# Patient Record
Sex: Male | Born: 1970 | Race: White | Hispanic: No | Marital: Married | Smoking: Never smoker
Health system: Southern US, Community
[De-identification: ages and names within clinical notes are randomized; demographics above are authoritative.]

## PROBLEM LIST (undated history)

## (undated) DIAGNOSIS — R112 Nausea with vomiting, unspecified: Secondary | ICD-10-CM

## (undated) DIAGNOSIS — Z9889 Other specified postprocedural states: Secondary | ICD-10-CM

## (undated) DIAGNOSIS — E119 Type 2 diabetes mellitus without complications: Secondary | ICD-10-CM

## (undated) DIAGNOSIS — F419 Anxiety disorder, unspecified: Secondary | ICD-10-CM

## (undated) DIAGNOSIS — Z87442 Personal history of urinary calculi: Secondary | ICD-10-CM

## (undated) DIAGNOSIS — M1990 Unspecified osteoarthritis, unspecified site: Secondary | ICD-10-CM

## (undated) DIAGNOSIS — I2510 Atherosclerotic heart disease of native coronary artery without angina pectoris: Secondary | ICD-10-CM

## (undated) HISTORY — PX: VASECTOMY: SHX75

## (undated) HISTORY — PX: CORONARY ARTERY BYPASS GRAFT: SHX141

---

## 2009-04-10 ENCOUNTER — Ambulatory Visit: Payer: Self-pay | Admitting: Cardiology

## 2018-04-20 ENCOUNTER — Other Ambulatory Visit (HOSPITAL_COMMUNITY): Payer: Self-pay | Admitting: Podiatry

## 2018-04-20 ENCOUNTER — Other Ambulatory Visit: Payer: Self-pay | Admitting: Podiatry

## 2018-04-20 DIAGNOSIS — M86471 Chronic osteomyelitis with draining sinus, right ankle and foot: Secondary | ICD-10-CM

## 2018-04-25 ENCOUNTER — Ambulatory Visit (HOSPITAL_COMMUNITY)
Admission: RE | Admit: 2018-04-25 | Discharge: 2018-04-25 | Disposition: A | Discharge status: Home / Self Care | Payer: BC Managed Care – PPO | Source: Ambulatory Visit | Attending: Podiatry | Admitting: Podiatry

## 2018-04-25 ENCOUNTER — Other Ambulatory Visit: Payer: Self-pay

## 2018-04-25 DIAGNOSIS — M86471 Chronic osteomyelitis with draining sinus, right ankle and foot: Secondary | ICD-10-CM | POA: Diagnosis not present

## 2018-04-25 MED ORDER — GADOBUTROL 1 MMOL/ML IV SOLN
10.0000 mL | Freq: Once | INTRAVENOUS | Status: AC | PRN
  Administered 2018-04-25: 12:00:00 10 mL via INTRAVENOUS

## 2018-05-04 ENCOUNTER — Other Ambulatory Visit: Payer: Self-pay | Admitting: Podiatry

## 2018-05-05 ENCOUNTER — Ambulatory Visit (HOSPITAL_COMMUNITY)
Admission: RE | Admit: 2018-05-05 | Discharge: 2018-05-05 | Disposition: A | Discharge status: Home / Self Care | Payer: BC Managed Care – PPO | Source: Ambulatory Visit | Attending: Podiatry | Admitting: Podiatry

## 2018-05-05 ENCOUNTER — Other Ambulatory Visit (HOSPITAL_COMMUNITY): Payer: Self-pay | Admitting: Podiatry

## 2018-05-05 ENCOUNTER — Encounter (HOSPITAL_COMMUNITY): Payer: Self-pay

## 2018-05-05 ENCOUNTER — Other Ambulatory Visit: Payer: Self-pay

## 2018-05-05 ENCOUNTER — Encounter (HOSPITAL_COMMUNITY)
Admission: RE | Admit: 2018-05-05 | Discharge: 2018-05-05 | Disposition: A | Discharge status: Home / Self Care | Payer: BC Managed Care – PPO | Source: Ambulatory Visit | Attending: Podiatry | Admitting: Podiatry

## 2018-05-05 DIAGNOSIS — M86471 Chronic osteomyelitis with draining sinus, right ankle and foot: Secondary | ICD-10-CM

## 2018-05-05 HISTORY — DX: Anxiety disorder, unspecified: F41.9

## 2018-05-05 HISTORY — DX: Personal history of urinary calculi: Z87.442

## 2018-05-05 HISTORY — DX: Other specified postprocedural states: Z98.890

## 2018-05-05 HISTORY — DX: Other specified postprocedural states: R11.2

## 2018-05-05 HISTORY — DX: Type 2 diabetes mellitus without complications: E11.9

## 2018-05-05 LAB — CBC WITH DIFFERENTIAL/PLATELET
Abs Immature Granulocytes: 0.03 10*3/uL (ref 0.00–0.07)
Basophils Absolute: 0.1 10*3/uL (ref 0.0–0.1)
Basophils Relative: 1 %
Eosinophils Absolute: 0.2 10*3/uL (ref 0.0–0.5)
Eosinophils Relative: 2 %
HCT: 43.4 % (ref 39.0–52.0)
Hemoglobin: 13.2 g/dL (ref 13.0–17.0)
Immature Granulocytes: 0 %
Lymphocytes Relative: 31 %
Lymphs Abs: 2.5 10*3/uL (ref 0.7–4.0)
MCH: 26.2 pg (ref 26.0–34.0)
MCHC: 30.4 g/dL (ref 30.0–36.0)
MCV: 86.3 fL (ref 80.0–100.0)
Monocytes Absolute: 0.6 10*3/uL (ref 0.1–1.0)
Monocytes Relative: 8 %
Neutro Abs: 4.8 10*3/uL (ref 1.7–7.7)
Neutrophils Relative %: 58 %
Platelets: 332 10*3/uL (ref 150–400)
RBC: 5.03 MIL/uL (ref 4.22–5.81)
RDW: 16.6 % — ABNORMAL HIGH (ref 11.5–15.5)
WBC: 8.3 10*3/uL (ref 4.0–10.5)
nRBC: 0 % (ref 0.0–0.2)

## 2018-05-05 LAB — BASIC METABOLIC PANEL
Anion gap: 9 (ref 5–15)
BUN: 18 mg/dL (ref 6–20)
CO2: 24 mmol/L (ref 22–32)
Calcium: 8.9 mg/dL (ref 8.9–10.3)
Chloride: 106 mmol/L (ref 98–111)
Creatinine, Ser: 0.94 mg/dL (ref 0.61–1.24)
GFR calc Af Amer: >60 mL/min (ref >60)
GFR calc non Af Amer: >60 mL/min (ref >60)
Glucose, Bld: 270 mg/dL — ABNORMAL HIGH (ref 70–99)
Potassium: 3.8 mmol/L (ref 3.5–5.1)
Sodium: 139 mmol/L (ref 135–145)

## 2018-05-05 LAB — HEMOGLOBIN A1C
Hgb A1c MFr Bld: 7.3 % — ABNORMAL HIGH (ref 4.8–5.6)
Mean Plasma Glucose: 162.81 mg/dL

## 2018-05-05 NOTE — Patient Instructions (Signed)
            Patrick [name]  05/05/2018     @[email or web address]@   Your procedure is scheduled on  05/10/2018  Report to [facility] at  615  A.M.  Call this number if you have problems the morning of surgery:  [phone]   Remember:  Do not eat or drink after midnight.                       Take these medicines the morning of surgery with A SIP OF WATER  zoloft    Do not wear jewelry, make-up or nail polish.  Do not wear lotions, powders, or perfumes, or deodorant.  Do not shave 48 hours prior to surgery.  Men may shave face and neck.  Do not bring valuables to the hospital.  [facility] is not responsible for any belongings or valuables.  Contacts, dentures or bridgework may not be worn into surgery.  Leave your suitcase in the car.  After surgery it may be brought to your room.  For patients admitted to the hospital, discharge time will be determined by your treatment team.  Patients discharged the day of surgery will not be allowed to drive home.   Name and phone number of your driver:   family Special instructions:    DO NOT take any medications for diabetes the morning of your surgery.  Please read over the following fact sheets that you were given. Anesthesia Post-op Instructions and Care and Recovery After Surgery      Negative Pressure Wound Therapy What is negative pressure wound therapy? Negative pressure wound therapy (NPWT) is a device that helps your wounds heal. NPWT helps your wound stay clean and healthy while it heals from the inside. NPWT uses a bandage (dressing) that is made of a sponge or gauze-like material. This dressing is placed on or inside the wound. The wound is then covered and sealed with a cover dressing that sticks to your skin (adhesive). This keeps air out. A tube connects the cover dressing to a small pump. The pump sucks fluid and germs from the wound. The pump also controls any odor coming from the wound. What are the benefits of  NPWT? The benefits of NPWT may include:  Faster healing.  Lower risk of infection.  Decrease in swelling and how much fluid is in the wound.  Fewer dressing changes.  Ability to treat your wound at home.  Shorter hospital stay.  Less pain. What are the risks of NPWT? NPWT is usually safe to use. The most common problem is skin irritation from the dressing adhesive, but there are many ways to help prevent this from happening. However, more serious problems can develop, such as:  Bleeding.  Infection.  Dehydration.  Pain. What do I need to do to care for my wound?  Do not take off the dressing yourself unless told to do so by your health care provider.  Keep all follow-up visits as told by your health care provider. This is important.  Make sure you know how to change your dressing, if you will be doing this at home.  Keep the area clean and dry.  Ask your health care provider for the best way to protect your skin from becoming irritated by the adhesive. What do I need to know about the pump?  Do not turn off the pump yourself unless told to do so by your health care provider, such as   for bathing.  Do not turn off the pump for more than two hours. If the pump is off for more than two hours, the dressing will need to be changed.  If your health care provider says it is okay to shower: ? Do not take the pump into the shower. ? Make sure the wound dressing is protected and sealed. The wound area must stay dry.  Check frequently that the machine is on, that the machine indicates the therapy is on, and that all clamps are open.  If the alarm sounds: ? Stay calm. ? Do not turn off the pump or do anything with the dressing. ? Call your health care provider right away if you cannot fix the problem. The alarm may go off because the battery is low, the dressing has a leak, or the fluid collection container is full. ? Explain to your health care provider what is happening.  Follow his or her instructions. When should I seek medical care? Seek medical care if:  You have new pain.  You develop irritation, a rash, or itching around the wound or dressing.  You see new black or yellow tissue in your wound.  The dressing changes are painful or cause bleeding.  The pump has been off for more than two hours and you do not know how to change the dressing.  The pump alarm goes off and you do not know what to do. When should I seek immediate medical care? Seek immediate medical care if:  You have a lot of bleeding.  You see a sudden change in the color or texture of the drainage.  The wound breaks open.  You have severe pain.  You have signs of infection, such as: ? More redness, swelling, or pain. ? More fluid or blood. ? Warmth. ? Pus or a bad smell. ? Red streaks leading from wound. ? A fever. This information is not intended to replace advice given to you by your health care provider. Make sure you discuss any questions you have with your health care provider. Document Released: 12/18/2007 Document Revised: 02/05/2017 Document Reviewed: 10/10/2014 Elsevier Interactive Patient Education  2019 [employer] Negative Pressure Wound Therapy Dressing Care Negative pressure wound therapy (NPWT) is a device that helps wounds heal. NPWT helps the wound stay clean and healthy while it heals from the inside. NPWT uses a bandage (dressing) that is made of a sponge or gauze-like material. The dressing is placed in or inside the wound. The wound is then covered and sealed with a cover dressing that sticks to your skin (adhesive). This keeps air out. A tube connects the cover dressing to a small pump. The pump sucks fluid and germs from the wound. The pump also controls any odor coming from the wound. What are the risks? NPWT is usually safe to use. The most common problem is skin irritation from the dressing adhesive, but there are many ways to prevent this from  happening. However, more serious problems can develop, such as:  Bleeding.  Infection.  Dehydration.  Pain. How to change your dressing How often you change your dressing depends on your wound. If the pump is off for more than two hours, the dressing will need to be changed. Follow your health care provider's instructions on how often to change it. Your health care provider may change your dressing, or a family member, friend, or caregiver may be shown how to change the dressing. It is important to:  Wear gloves and protective   clothing while changing a dressing. This may include eye protection.  Never let anyone change your dressing if he or she has an infection, skin condition, or skin wound or cut of any size. Preparing to change your dressing  If needed, take pain medicine 30 minutes before the dressing change as prescribed by your health care provider.  Set up a clean station for wound care. You will need: ? A disposable garbage bag that is open and ready to use. ? Hand sanitizer. ? Wound cleanser or saltwater solution (saline) as told by your health care provider. ? New dressing material or bandages. Make sure to open the dressing package so that the dressing remains on the inside of the package. You may also need the following in your clean station:  A box of vinyl gloves.  Tape.  Skin protectant. This may be a wipe, film, or spray.  Clean or germ-free (sterile) scissors.  Wound liner.  Cotton tip applicators. Removing your old dressing  Wash your hands with soap and water. Dry your hands with a clean towel. If soap and water are not available, use hand sanitizer.  Put on gloves.  Turn off the pump and disconnect the tubing from the dressing.  Carefully remove the adhesive cover dressing in the direction of your hair growth. Only touch the outside edges of the dressing.  Remove the dressing that is inside the wound. If the dressing sticks, use a wound cleanser or  saline solution to wet the dressing. This helps it come off more easily.  Throw the old dressing supplies into the ready garbage bag.  Remove your gloves by grabbing the cuff and turning the glove inside out. Place the gloves in the trash immediately.  Wash your hands with soap and water. Dry your hands with a clean towel. If soap and water are not available, use hand sanitizer. Cleaning your wound  Follow your health care provider's instructions on how to clean your wound. This may include using a saline or recommended wound cleanser.  Do not use over-the-counter medicated or antiseptic creams, sprays, liquids, or dressings unless told to do so by your health care provider.  Clean the area thoroughly with the recommended saline solution or wound cleanser and a clean gauze pad.  Throw the gauze pad into the garbage bag.  Wash your hands with soap and water. Dry your hands with a clean towel. If soap and water are not available, use hand sanitizer. Applying the dressing  Apply a skin protectant to any skin that will be exposed to adhesive. Let the skin protectant dry.  Put a new dressing into the wound.  Apply a new cover dressing and tube.  Take off your gloves. Put them in the plastic bag with the old dressing. Tie the bag shut and throw it away.  Wash your hands with soap and water. Dry your hands with a clean towel. If soap and water are not available, use hand sanitizer.  Attach the suction and turn the pump back on. Do not change the settings on the machine without talking to a health care provider.  Replace the container in the pump that collects fluid if it is full. Do this at least once a week. Contact a health care provider if:  You have new pain.  You develop irritation, a rash, or itching around the wound or dressing.  You see new black or yellow tissue in your wound.  The dressing changes are painful or cause bleeding.  The  pump has been off for more than two  hours and you do not know how to change the dressing.  The alarm for the pump goes off and you do not know what to do. Get help right away if:  You have a lot of bleeding.  You see a sudden change in the color or texture of the drainage.  The wound breaks open.  You have severe pain.  You have signs of infection, such as: ? More redness, swelling, or pain. ? More fluid or blood. ? Warmth. ? Pus or a bad smell. ? Red streaks leading from wound. ? A fever. This information is not intended to replace advice given to you by your health care provider. Make sure you discuss any questions you have with your health care provider. Document Released: 03/29/2011 Document Revised: 01/30/2015 Document Reviewed: 10/10/2014 Elsevier Interactive Patient Education  2019 [place] With an Amputation An amputation is a surgical procedure to remove all or part of an arm or leg (limb). After this surgery, it will take time for you to heal and get used to living with the amputation. There are things you can do to help yourself adjust. Living with an amputation can be challenging, but it is often possible to do all of the activities that you used to do. How to manage lifestyle changes      Return to your normal activities as told by your health care provider. Ask your health care provider what activities are safe for you.  You may choose to have an artificial limb (prosthesis) made for you. Use and care for your prosthesis as told by your health care provider.  Discuss all of your leisure interests with your health care provider and prosthesis specialist. Equipment changes can often be made, which may allow you to return to a sport or hobby. Some Teacher, English as a foreign language for this purpose.  Talk with your health care team about returning to work. ? When you are ready to return to work, your therapists can evaluate your job site and make recommendations to help you do your job.  ? If you are not able to return to the same job, [employer] can help you with job retraining.  Talk with your health care provider about returning to driving. You may: ? Work with an occupational therapist to learn new ways for safely driving with an amputation. ? Work with a prosthesis specialist or physical therapist to identify assistive devices for safe driving. How to recognize stress Some common challenges of living with an amputation may bring about stress. These issues are normal. They include:  Changes in how you move around.  Changes in how you care for yourself.  Changes in how you participate in leisure activities.  Emotions after the amputation, such as grief and anger.  Issues with body image and weight.  Problems with pain and treatment. How to manage stress Use these strategies to ease stress related to the daily challenges of living with an amputation:  Be aware of any sources of your stress. Monitor yourself for symptoms of stress. Identify what challenges cause the most stress for you.  Rethink the situation. Try to: ? Think realistically about stressful challenges. Do not ignore these challenges. Do not overreact to them. ? Try to find the positives in a stressful situation. Do not focus on the negatives.  Talk about your emotional challenges, such as grief, with a mental health professional.  Connect  with other people who have gone through the same experience.  Find ways to help relax your body and mind. Examples include: ? Meditation, deep breathing, or progressive relaxation techniques. ? Hypnosis or guided imagery. ? Yoga or tai chi. ? Biofeedback or mindfulness techniques. ? Keeping a journal. ? Doing hobbies, like listening to music or being out in nature. ? Exercise. Talk with your rehabilitation team about the best way to get 30 minutes of physical activity at least 5 days a week. Where to find support: Talking  to others  Look for a local or online support group for people living with an amputation. Finances  Talk to your insurance company about what assistive devices your plan covers.  Contact national or local amputee charities to find out if you are eligible for scholarships and medical equipment for people in need.  People in [employer] service may be eligible for financial assistance or programs that support amputees. Rehabilitation A rehabilitation program can help you regain mobility and independence. Your rehabilitation team may include:  Physicians and nurses.  Physical and occupational therapists.  Prosthesis specialists.  Social workers.  Mental health professionals.  Diet and nutrition specialists. Follow these instructions at home: Managing pain  Work with your health care provider to manage any residual or phantom limb pain. This may include: ? Pain medicine. ? Physical therapy. ? Techniques that help retrain the brain and nervous system (movement representation techniques). ? Control and instrumentation engineer. For this treatment, stimulation is applied to different parts of your stump, and you describe what you feel. ? Biofeedback. This involves using monitors that alert you to changes in your breathing, heart rate, skin temperature, or muscle activity, and using relaxation techniques to reverse those changes. ? Acupuncture. Eating and drinking  Work with a dietitian to develop an eating plan that helps you maintain a healthy body weight. Your plan may include a balance of: ? Fresh fruits and vegetables. ? Whole grains. ? Lean meats. ? Low-fat dairy. ? Healthy fats, such as fish, nuts, avocado, or olive oil.  Do not drink alcohol to cope with stress. General instructions  Take over-the-counter and prescription medicines only as told by your health care provider.  Do not use any products that contain nicotine or tobacco, such as cigarettes and e-cigarettes. If you  need help quitting, ask your health care provider.  Keep all follow-up visits as told by your health care provider. This is important. Where to find more information You can find more information about living with an amputation from:  Amputee Coalition: www.amputee-coalition.org  Amputee Support Groups: amputee.supportgroups.[place]: www.nationalamputation.[facility] on Health, Physical Activity and Disability: www.nchpad.org  [employer] [place]: www.disabledsports[place].org Contact a health care provider if:  Your pain does not improve with medicine or treatment.  You have trouble managing your emotions about losing an extremity. Get help right away if you:  Have severe pain. Summary  It will take time for you to heal and get used to living with an amputation.  Look for a local or online support group for people living with an amputation.  Work with your health care provider to manage any phantom limb pain. This information is not intended to replace advice given to you by your health care provider. Make sure you discuss any questions you have with your health care provider. Document Released: 09/26/2001 Document Revised: 12/01/2016 Document Reviewed: 12/01/2016 Elsevier Interactive Patient Education  2019 [place].  Surgical Wound Debridement, Care After Refer to  this sheet in the next few weeks. These instructions provide you with information about caring for yourself after your procedure. Your health care provider may also give you more specific instructions. Your treatment has been planned according to current medical practices, but problems sometimes occur. Call your health care provider if you have any problems or questions after your procedure. What can I expect after the procedure? After the procedure, it is common to have:  Pain or soreness.  Fluid that leaks from the wound.  Stiffness. Follow these instructions at home:  Medicines  Take over-the-counter and prescription medicines only as told by your health care provider.  If you were prescribed an antibiotic medicine, take it or apply it as told by your health care provider. Do not stop taking or using the antibiotic even if your condition improves. Wound care  Follow instructions from your health care provider about: ? How to take care of your wound. ? When and how you should change your dressing. ? When you should remove your dressing. If your dressing is dry and stuck when you try to remove it, moisten or wet the dressing with saline or water so that it can be removed without harming your skin or wound tissue.  Check your wound every day for signs of infection. Have a caregiver do this for you if you are not able. Watch for: ? More redness, swelling, or pain. ? More fluid, blood, or pus. ? A bad smell. Activity   Do not drive or operate heavy machinery while taking prescription pain medicine.  Ask your health care provider what activities are safe for you. General instructions  Eat a healthy diet with lots of protein. Ask your health care provider to suggest the best diet for you.  Do not smoke. Smoking makes it harder for your body to heal.  Keep all follow-up visits as told by your health care provider. This is important.  Do not take baths, swim, or use a hot tub until your health care provider approves. Contact a health care provider if:  You have a fever.  Your pain medicine is not helping.  Your wound is red and swollen.  You have increased bleeding.  You have pus coming from your wound.  You have a bad smell coming from your wound.  Your wound is not getting better after 1-2 weeks of treatment.  You develop a new medical condition, such as diabetes, peripheral vascular disease, or conditions that affect your defense (immune) system. This information is not intended to replace advice given to you by your health care  provider. Make sure you discuss any questions you have with your health care provider. Document Released: 12/22/2011 Document Revised: 06/11/2015 Document Reviewed: 05/15/2014 Elsevier Interactive Patient Education  2019 [place] Anesthesia, Adult, Care After This sheet gives you information about how to care for yourself after your procedure. Your health care provider may also give you more specific instructions. If you have problems or questions, contact your health care provider. What can I expect after the procedure? After the procedure, the following side effects are common:  Pain or discomfort at the IV site.  Nausea.  Vomiting.  Sore throat.  Trouble concentrating.  Feeling cold or chills.  Weak or tired.  Sleepiness and fatigue.  Soreness and body aches. These side effects can affect parts of the body that were not involved in surgery. Follow these instructions at home:  For at least 24 hours after the procedure:  Have a  responsible adult stay with you. It is important to have someone help care for you until you are awake and alert.  Rest as needed.  Do not: ? Participate in activities in which you could fall or become injured. ? Drive. ? Use heavy machinery. ? Drink alcohol. ? Take sleeping pills or medicines that cause drowsiness. ? Make important decisions or sign legal documents. ? Take care of children on your own. Eating and drinking  Follow any instructions from your health care provider about eating or drinking restrictions.  When you feel hungry, start by eating small amounts of foods that are soft and easy to digest (bland), such as toast. Gradually return to your regular diet.  Drink enough fluid to keep your urine pale yellow.  If you vomit, rehydrate by drinking water, juice, or clear broth. General instructions  If you have sleep apnea, surgery and certain medicines can increase your risk for breathing problems. Follow  instructions from your health care provider about wearing your sleep device: ? Anytime you are sleeping, including during daytime naps. ? While taking prescription pain medicines, sleeping medicines, or medicines that make you drowsy.  Return to your normal activities as told by your health care provider. Ask your health care provider what activities are safe for you.  Take over-the-counter and prescription medicines only as told by your health care provider.  If you smoke, do not smoke without supervision.  Keep all follow-up visits as told by your health care provider. This is important. Contact a health care provider if:  You have nausea or vomiting that does not get better with medicine.  You cannot eat or drink without vomiting.  You have pain that does not get better with medicine.  You are unable to pass urine.  You develop a skin rash.  You have a fever.  You have redness around your IV site that gets worse. Get help right away if:  You have difficulty breathing.  You have chest pain.  You have blood in your urine or stool, or you vomit blood. Summary  After the procedure, it is common to have a sore throat or nausea. It is also common to feel tired.  Have a responsible adult stay with you for the first 24 hours after general anesthesia. It is important to have someone help care for you until you are awake and alert.  When you feel hungry, start by eating small amounts of foods that are soft and easy to digest (bland), such as toast. Gradually return to your regular diet.  Drink enough fluid to keep your urine pale yellow.  Return to your normal activities as told by your health care provider. Ask your health care provider what activities are safe for you. This information is not intended to replace advice given to you by your health care provider. Make sure you discuss any questions you have with your health care provider. Document Released: 04/12/2000 Document  Revised: 08/20/2016 Document Reviewed: 08/20/2016 Elsevier Interactive Patient Education  2019 Elsevier Inc.  

## 2018-05-08 NOTE — Pre-Procedure Instructions (Signed)
HgbA1C routed to PCP and Dr Caprice Beaver.

## 2018-05-10 ENCOUNTER — Ambulatory Visit (HOSPITAL_COMMUNITY): Payer: BC Managed Care – PPO | Admitting: Anesthesiology

## 2018-05-10 ENCOUNTER — Ambulatory Visit (HOSPITAL_COMMUNITY): Payer: BC Managed Care – PPO

## 2018-05-10 ENCOUNTER — Ambulatory Visit (HOSPITAL_COMMUNITY)
Admission: RE | Admit: 2018-05-10 | Discharge: 2018-05-10 | Disposition: A | Discharge status: Home / Self Care | Payer: BC Managed Care – PPO | Attending: Podiatry | Admitting: Podiatry

## 2018-05-10 ENCOUNTER — Other Ambulatory Visit: Payer: Self-pay

## 2018-05-10 ENCOUNTER — Encounter (HOSPITAL_COMMUNITY)
Admission: RE | Disposition: A | Discharge status: Home / Self Care | Payer: Self-pay | Source: Home / Self Care | Attending: Podiatry

## 2018-05-10 ENCOUNTER — Encounter (HOSPITAL_COMMUNITY): Payer: Self-pay

## 2018-05-10 DIAGNOSIS — E1140 Type 2 diabetes mellitus with diabetic neuropathy, unspecified: Secondary | ICD-10-CM | POA: Diagnosis not present

## 2018-05-10 DIAGNOSIS — Z419 Encounter for procedure for purposes other than remedying health state, unspecified: Secondary | ICD-10-CM

## 2018-05-10 DIAGNOSIS — L97519 Non-pressure chronic ulcer of other part of right foot with unspecified severity: Secondary | ICD-10-CM | POA: Insufficient documentation

## 2018-05-10 DIAGNOSIS — F419 Anxiety disorder, unspecified: Secondary | ICD-10-CM | POA: Diagnosis not present

## 2018-05-10 DIAGNOSIS — M86471 Chronic osteomyelitis with draining sinus, right ankle and foot: Secondary | ICD-10-CM

## 2018-05-10 DIAGNOSIS — Y92012 Bathroom of single-family (private) house as the place of occurrence of the external cause: Secondary | ICD-10-CM | POA: Diagnosis not present

## 2018-05-10 DIAGNOSIS — L97529 Non-pressure chronic ulcer of other part of left foot with unspecified severity: Secondary | ICD-10-CM | POA: Insufficient documentation

## 2018-05-10 DIAGNOSIS — M86671 Other chronic osteomyelitis, right ankle and foot: Secondary | ICD-10-CM | POA: Insufficient documentation

## 2018-05-10 DIAGNOSIS — E1169 Type 2 diabetes mellitus with other specified complication: Secondary | ICD-10-CM | POA: Diagnosis present

## 2018-05-10 DIAGNOSIS — Z9889 Other specified postprocedural states: Secondary | ICD-10-CM

## 2018-05-10 DIAGNOSIS — W450XXA Nail entering through skin, initial encounter: Secondary | ICD-10-CM | POA: Insufficient documentation

## 2018-05-10 DIAGNOSIS — S91331A Puncture wound without foreign body, right foot, initial encounter: Secondary | ICD-10-CM | POA: Insufficient documentation

## 2018-05-10 HISTORY — PX: WOUND DEBRIDEMENT: SHX247

## 2018-05-10 HISTORY — PX: APPLICATION OF WOUND VAC: SHX5189

## 2018-05-10 HISTORY — PX: AMPUTATION: SHX166

## 2018-05-10 LAB — GLUCOSE, CAPILLARY
Glucose-Capillary: 121 mg/dL — ABNORMAL HIGH (ref 70–99)
Glucose-Capillary: 132 mg/dL — ABNORMAL HIGH (ref 70–99)

## 2018-05-10 SURGERY — AMPUTATION DIGIT
Anesthesia: Monitor Anesthesia Care | Site: Foot | Laterality: Right

## 2018-05-10 MED ORDER — ONDANSETRON HCL 4 MG/2ML IJ SOLN
INTRAMUSCULAR | Status: DC | PRN
  Administered 2018-05-10: 4 mg via INTRAVENOUS

## 2018-05-10 MED ORDER — DIPHENHYDRAMINE HCL 50 MG/ML IJ SOLN
INTRAMUSCULAR | Status: DC | PRN
  Administered 2018-05-10 (×2): 25 mg via INTRAVENOUS

## 2018-05-10 MED ORDER — MEPERIDINE HCL 50 MG/ML IJ SOLN: 6.2500 mg | INTRAMUSCULAR | Status: DC | PRN

## 2018-05-10 MED ORDER — SODIUM CHLORIDE 0.9 % IR SOLN
Status: DC | PRN
  Administered 2018-05-10: 3000 mL

## 2018-05-10 MED ORDER — HYDROCODONE-ACETAMINOPHEN 7.5-325 MG PO TABS: 1.0000 | ORAL_TABLET | Freq: Once | ORAL | Status: DC | PRN

## 2018-05-10 MED ORDER — FENTANYL CITRATE (PF) 100 MCG/2ML IJ SOLN
INTRAMUSCULAR | Status: AC
  Filled 2018-05-10: qty 2

## 2018-05-10 MED ORDER — MIDAZOLAM HCL 2 MG/2ML IJ SOLN
INTRAMUSCULAR | Status: AC
  Filled 2018-05-10: qty 2

## 2018-05-10 MED ORDER — DEXAMETHASONE SODIUM PHOSPHATE 4 MG/ML IJ SOLN
INTRAMUSCULAR | Status: DC | PRN
  Administered 2018-05-10: 8 mg via INTRAVENOUS

## 2018-05-10 MED ORDER — DIPHENHYDRAMINE HCL 50 MG/ML IJ SOLN
INTRAMUSCULAR | Status: AC
  Filled 2018-05-10: qty 1

## 2018-05-10 MED ORDER — ONDANSETRON HCL 4 MG/2ML IJ SOLN
INTRAMUSCULAR | Status: AC
  Filled 2018-05-10: qty 2

## 2018-05-10 MED ORDER — HYDROMORPHONE HCL 1 MG/ML IJ SOLN: 0.2500 mg | INTRAMUSCULAR | Status: DC | PRN

## 2018-05-10 MED ORDER — PROPOFOL 10 MG/ML IV BOLUS
INTRAVENOUS | Status: AC
  Filled 2018-05-10: qty 40

## 2018-05-10 MED ORDER — CHLORHEXIDINE GLUCONATE CLOTH 2 % EX PADS: 6.0000 | MEDICATED_PAD | Freq: Once | CUTANEOUS | Status: DC

## 2018-05-10 MED ORDER — PROMETHAZINE HCL 25 MG/ML IJ SOLN: 6.2500 mg | INTRAMUSCULAR | Status: DC | PRN

## 2018-05-10 MED ORDER — VANCOMYCIN HCL IN DEXTROSE 1-5 GM/200ML-% IV SOLN
1000.0000 mg | Freq: Once | INTRAVENOUS | Status: AC
  Administered 2018-05-10 (×2): 1000 mg via INTRAVENOUS
  Filled 2018-05-10: qty 200

## 2018-05-10 MED ORDER — PROPOFOL 10 MG/ML IV BOLUS
INTRAVENOUS | Status: DC | PRN
  Administered 2018-05-10: 20 mg via INTRAVENOUS

## 2018-05-10 MED ORDER — PROPOFOL 500 MG/50ML IV EMUL
INTRAVENOUS | Status: DC | PRN
  Administered 2018-05-10: 75 ug/kg/min via INTRAVENOUS
  Administered 2018-05-10: 08:00:00 via INTRAVENOUS

## 2018-05-10 MED ORDER — DEXAMETHASONE SODIUM PHOSPHATE 10 MG/ML IJ SOLN
INTRAMUSCULAR | Status: AC
  Filled 2018-05-10: qty 1

## 2018-05-10 MED ORDER — 0.9 % SODIUM CHLORIDE (POUR BTL) OPTIME
TOPICAL | Status: DC | PRN
  Administered 2018-05-10: 1000 mL

## 2018-05-10 MED ORDER — BUPIVACAINE HCL (PF) 0.5 % IJ SOLN
INTRAMUSCULAR | Status: DC | PRN
  Administered 2018-05-10: 20 mL

## 2018-05-10 MED ORDER — MIDAZOLAM HCL 5 MG/5ML IJ SOLN
INTRAMUSCULAR | Status: DC | PRN
  Administered 2018-05-10: 2 mg via INTRAVENOUS

## 2018-05-10 MED ORDER — LACTATED RINGERS IV SOLN: INTRAVENOUS | Status: DC

## 2018-05-10 MED ORDER — LACTATED RINGERS IV SOLN
INTRAVENOUS | Status: DC
  Administered 2018-05-10: 07:00:00 via INTRAVENOUS

## 2018-05-10 MED ORDER — BUPIVACAINE HCL (PF) 0.5 % IJ SOLN
INTRAMUSCULAR | Status: AC
  Filled 2018-05-10: qty 30

## 2018-05-10 SURGICAL SUPPLY — 55 items
BANDAGE ELASTIC 4 VELCRO NS (GAUZE/BANDAGES/DRESSINGS) ×4 IMPLANT
BANDAGE ELASTIC 4 VELCRO ST LF (GAUZE/BANDAGES/DRESSINGS) ×8 IMPLANT
BANDAGE ESMARK 4X12 BL STRL LF (DISPOSABLE) ×2 IMPLANT
BENZOIN TINCTURE PRP APPL 2/3 (GAUZE/BANDAGES/DRESSINGS) ×8 IMPLANT
BLADE 15 SAFETY STRL DISP (BLADE) ×8 IMPLANT
BLADE OSC/SAGITTAL MD 9X18.5 (BLADE) ×4 IMPLANT
BLADE SURG 15 STRL LF DISP TIS (BLADE) ×4 IMPLANT
BLADE SURG 15 STRL SS (BLADE) ×4
BNDG CONFORM 2 STRL LF (GAUZE/BANDAGES/DRESSINGS) ×8 IMPLANT
BNDG ESMARK 4X12 BLUE STRL LF (DISPOSABLE) ×4
BNDG GAUZE ELAST 4 BULKY (GAUZE/BANDAGES/DRESSINGS) ×8 IMPLANT
CANISTER WOUND CARE 500ML ATS (WOUND CARE) IMPLANT
CLOSURE WOUND 1/2 X4 (GAUZE/BANDAGES/DRESSINGS) ×1
CLOTH BEACON ORANGE TIMEOUT ST (SAFETY) ×4 IMPLANT
COVER LIGHT HANDLE STERIS (MISCELLANEOUS) ×8 IMPLANT
CUFF TOURNIQUET SINGLE 18IN (TOURNIQUET CUFF) ×4 IMPLANT
DECANTER SPIKE VIAL GLASS SM (MISCELLANEOUS) ×4 IMPLANT
DRSG ADAPTIC 3X8 NADH LF (GAUZE/BANDAGES/DRESSINGS) ×4 IMPLANT
DRSG VAC ATS MED SENSATRAC (GAUZE/BANDAGES/DRESSINGS) IMPLANT
DRSG VAC ATS SM SENSATRAC (GAUZE/BANDAGES/DRESSINGS) ×4 IMPLANT
DRSG VERSA FOAM LRG 10X15 (GAUZE/BANDAGES/DRESSINGS) ×4 IMPLANT
ELECT REM PT RETURN 9FT ADLT (ELECTROSURGICAL) ×4
ELECTRODE REM PT RTRN 9FT ADLT (ELECTROSURGICAL) ×2 IMPLANT
GAUZE SPONGE 4X4 12PLY STRL (GAUZE/BANDAGES/DRESSINGS) ×4 IMPLANT
GAUZE SPONGE 4X4 12PLY STRL LF (GAUZE/BANDAGES/DRESSINGS) ×8 IMPLANT
GLOVE BIO SURGEON STRL SZ7.5 (GLOVE) ×8 IMPLANT
GLOVE BIOGEL PI IND STRL 6.5 (GLOVE) ×2 IMPLANT
GLOVE BIOGEL PI IND STRL 7.0 (GLOVE) ×4 IMPLANT
GLOVE BIOGEL PI INDICATOR 6.5 (GLOVE) ×2
GLOVE BIOGEL PI INDICATOR 7.0 (GLOVE) ×4
GLOVE ECLIPSE 6.5 STRL STRAW (GLOVE) ×4 IMPLANT
GLOVE ECLIPSE 7.0 STRL STRAW (GLOVE) ×4 IMPLANT
GOWN STRL REUS W/ TWL LRG LVL3 (GOWN DISPOSABLE) ×2 IMPLANT
GOWN STRL REUS W/TWL LRG LVL3 (GOWN DISPOSABLE) ×10 IMPLANT
HANDPIECE INTERPULSE COAX TIP (DISPOSABLE) ×2
IV NS IRRIG 3000ML ARTHROMATIC (IV SOLUTION) ×4 IMPLANT
KIT TURNOVER CYSTO (KITS) ×4 IMPLANT
KIT TURNOVER KIT A (KITS) ×4 IMPLANT
MANIFOLD NEPTUNE II (INSTRUMENTS) ×4 IMPLANT
NEEDLE HYPO 27GX1-1/4 (NEEDLE) ×8 IMPLANT
NS IRRIG 1000ML POUR BTL (IV SOLUTION) ×4 IMPLANT
PACK BASIC LIMB (CUSTOM PROCEDURE TRAY) ×4 IMPLANT
PAD ARMBOARD 7.5X6 YLW CONV (MISCELLANEOUS) ×4 IMPLANT
SET BASIN LINEN APH (SET/KITS/TRAYS/PACK) ×4 IMPLANT
SET HNDPC FAN SPRY TIP SCT (DISPOSABLE) ×2 IMPLANT
SOL PREP PROV IODINE SCRUB 4OZ (MISCELLANEOUS) ×4 IMPLANT
SPONGE LAP 18X18 RF (DISPOSABLE) ×4 IMPLANT
STOCKINETTE IMPERVIOUS LG (DRAPES) ×8 IMPLANT
STRIP CLOSURE SKIN 1/2X4 (GAUZE/BANDAGES/DRESSINGS) ×3 IMPLANT
SUT ETHILON 4 0 PS 2 18 (SUTURE) ×8 IMPLANT
SUT PROLENE 4 0 PS 2 18 (SUTURE) IMPLANT
SUT VIC AB 4-0 PS2 27 (SUTURE) IMPLANT
SWAB CULTURE LIQ STUART DBL (MISCELLANEOUS) ×4 IMPLANT
SYR CONTROL 10ML LL (SYRINGE) ×4 IMPLANT
TUBE ANAEROBIC PORT A CUL  W/M (MISCELLANEOUS) ×4 IMPLANT

## 2018-05-10 NOTE — Discharge Instructions (Signed)
These instructions will give you an idea of what to expect after surgery and how to manage issues that may arise before your first post op office visit.  Pain Management Pain is best managed by staying ahead of it. If pain gets out of control, it is difficult to get it back under control. Local anesthesia that lasts 6-8 hours is used to numb the foot and decrease pain.  For the best pain control, take the pain medication every 4 hours for the first 2 days post op. On the third day pain medication can be taken as needed.   Post Op Nausea Nausea is common after surgery, so it is managed proactively.  If prescribed, use the prescribed nausea medication regularly for the first 2 days post op.  Bandages Do not worry if there is blood on the bandage. What looks like a lot of blood on the bandage is actually a small amount. Blood on the dressing spreads out as it is absorbed by the gauze, the same way a drop of water spreads out on a paper towel.  If the bandages feel wet or dry, stiff and uncomfortable, call the office during office hours and we will schedule a time for you to have the bandage changed.  Unless you are specifically told otherwise, we will do the first bandage change in the office.  Keep your bandages dry. If either bandage becomes wet or soiled, notify the office and we will schedule a time to change the bandage.  Activity It is best to spend most of the first 2 days after surgery lying down with your feet elevated above the level of your heart. You may put weight on your heels while wearing the surgical shoes.   You may only get up to go to the restroom.  Driving Do not drive until you are able to respond in an emergency (i.e. slam on the brakes). This usually occurs after the bone has healed - 6 to 8 weeks.  Call the Office If you have a fever over 101F.  If you have increasing pain after the initial post op pain has settled down.  If you have increasing redness, swelling, or  drainage.  If you have any questions or concerns.

## 2018-05-10 NOTE — Op Note (Addendum)
OPERATIVE NOTE  DATE OF PROCEDURE 05/10/2018  SURGEON Marcheta Grammes, DPM  ASSISTANT SURGEON None  OR STAFF Circulator: Cox, Gershon Mussel, RN; Paulo Fruit, RN Scrub Person: Lucie Leather, CST   PREOPERATIVE DIAGNOSIS 1.  Chronic osteomyelitis of the fifth ray, right foot 2.  Ulceration, right foot 3.  Puncture wound, right foot 4.  Ulceration, left foot 5.  Diabetes mellitus with peripheral neuropathy  POSTOPERATIVE DIAGNOSIS Same  PROCEDURE 1.  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  Application of negative pressure wound therapy device (CPT [id number]) 3.  Selective debridement of puncture wound of right foot (CPT 97597) 4.  Excisional debridement of ulceration of left foot (CPT 11042)  ANESTHESIA Monitor Anesthesia Care   HEMOSTASIS Pneumatic ankle tourniquet set at 250 mmHg  ESTIMATED BLOOD LOSS Minimal (<5 cc)  MATERIALS USED 1.  White KCI wound VAC sponge x1 piece 2.  Black KCI wound VAC sponge x3 pieces  INJECTABLES 0.5% Marcaine plain  PATHOLOGY 1.  Aerobic culture of amputation site, right foot 2.  Anaerobic culture of amputation site, right foot 3.  Fifth digit and distal fifth metatarsal, right foot 4.  Bone wafer from fifth metatarsal for margins, right foot  COMPLICATIONS None  INDICATIONS:  The patient first presented to my office on April 18, 2018 for evaluation of nonpainful wounds of both feet.  The wound of the right foot developed approximately 1 year ago after he fell asleep with a heating pad on his foot.  He subsequently developed wounds along the dorsal lateral aspect of his right foot consistent with sinus tracts.  3 bone fragments have extruded from his foot.  Plain film radiographs and an MRI revealed findings consistent with osteomyelitis.    He developed the ulceration of his left foot in December 2019.  He attributed it to a piece of plastic in his shoe.  The puncture wound of the right foot  approximately 2 weeks ago.  He stepped on a nail while walking to the bathroom without his Darco shoe.  DESCRIPTION OF THE PROCEDURE:  The patient was brought to the operating room and placed on the operative table in the supine position.  A pneumatic ankle tourniquet was placed about the patient's right ankle.  A timeout was performed.  The right foot was anesthetized with 0.5% Marcaine plain.  Both feet were scrubbed prepped and draped in the usual sterile manner.  A second timeout was performed.  Attention was directed to the dorsal lateral aspect of the right foot.  A full-thickness wound was encountered overlying the dorsal aspect of the right fifth metatarsal measuring 1.0 x 1.1 x 0.2 cm.  The wound bed was hyper granular.  2 converging semielliptical incisions were made excising the ulceration in its entirety.  The incision was extended distally to the base of the fifth toe.  2 converging semielliptical incisions were made encompassing the fifth toe circumferentially.  Dissection was continued deep down to the level of the fifth metatarsal.  There is significant destructive changes of the distal fifth metatarsal and base of the proximal phalanx.  The fifth toe was freed of all soft tissue attachments, removed and passed from the operative field.  The fifth metatarsal resected using a power bone saw proximal to the erosive changes.  The bone fragments were freed of all soft tissue attachments, removed and passed from the operative field.  The fifth toe and distal portions of the fifth metatarsal were submitted to pathology as  one specimen.  Aerobic and anaerobic cultures were obtained.  A small wafer of bone was resected from the distal aspect of the remaining fifth metatarsal.  The bone wafer was submitted to pathology for margins.  The surgical wound was irrigated with copious amounts of sterile saline using power irrigation.  The proximal aspect of the incision was closed using 4-0 nylon in a horizontal  mattress and simple suture technique.  I was unable to close the distal aspect of the surgical wound due to the size of the soft tissue defect which measured 2.9 x 1.5 x 1.8 cm.  The tourniquet was released.  Hemostasis was achieved with electrocautery.  The tourniquet was reinflated.  A white KCI sponge was cut and inserted into the depth of the wound.  A KCI wound VAC dressing was applied.  The Wound VAC was found to be operational at 125 mmHg continuous pressure.  Attention was directed to the puncture wound along the plantar aspect of the right second metatarsal.  The puncture wound measured 0.7 x 0.6 x 0.1 cm.  The wound bed was pink-red and viable.  The periwound was hyperkeratotic.  The wound required debridement of the devitalized/hyperkeratotic tissue.  A selective debridement of the ulceration was performed using a #15 blade and soft tissue nipper.  Devitalized/hyperkeratotic tissue was debrided and removed.  Post debridement measurements were unchanged.    A Betadine dressing was applied to the right foot.  The pneumatic ankle tourniquet was deflated and a prompt hyperemic response was noted to all remaining digits of the right foot.  Attention was directed to the ulceration along the plantar aspect of the left first metatarsal head.  The ulceration measured 2.3 x 2.9 x 0.2 cm.  The wound bed was comprised of pale pink and and yellow fibrotic tissue.  The ulceration required debridement of the adherent, nonviable fibrotic tissue.  A sharp excisional debridement of the ulceration was performed using a #15 blade and soft tissue nipper.  Adherent, nonviable fibrotic tissue including subcutaneous tissue was debrided and removed.  Hemostasis was achieved with compression.  Post debridement, the ulceration measured 2.7 x 3.1 x 0.4 cm.  A Betadine dressing was applied to the left foot.  The patient tolerated all procedures well.  He was transferred from the operating room to the postanesthesia care unit  with vital signs stable and vascular status intact to all remaining digits of both feet.

## 2018-05-10 NOTE — H&P (Signed)
HISTORY AND PHYSICAL INTERVAL NOTE:  05/10/2018  7:22 AM  Patrick [name]  has presented today for surgery, with the diagnosis of chronic osteomyelitis 5th ray right foot, ulceration right foot, puncture wound right foot, ulceration left foot, diabetes mellitus with peripheral neuropathy.  The various methods of treatment have been discussed with the patient.  No guarantees were given.  After consideration of risks, benefits and other options for treatment, the patient has consented to surgery.  I have reviewed the patients' chart and labs.    Patient Vitals for the past 24 hrs:  BP Temp Temp src Pulse Resp SpO2  05/10/18 0647 122/87 98.3 F (36.8 C) Oral 89 16 100 %    A history and physical examination was performed in my office.  The patient was reexamined.  There have been no changes to this history and physical examination.  Marcheta Grammes, DPM

## 2018-05-10 NOTE — Transfer of Care (Signed)
Immediate Anesthesia Transfer of Care Note  Patient: Patrick [name]  Procedure(s) Performed: PARTIAL 5TH RAY AMPUTATION RIGHT FOOT (Right Foot) APPLICATION OF WOUND VAC RIGHT FOOT (Right Foot) DEBRIDEMENT PUNCTURE WOUND (Right Foot) DEBRIDEMENT ULCERATION LEFT FOOT (Left Foot)  Patient Location: PACU  Anesthesia Type:MAC  Level of Consciousness: awake and patient cooperative  Airway & Oxygen Therapy: Patient Spontanous Breathing and Patient connected to nasal cannula oxygen  Post-op Assessment: Report given to RN and Post -op Vital signs reviewed and stable  Post vital signs: Reviewed and stable  Last Vitals:  Vitals Value Taken Time  BP    Temp    Pulse 95 05/10/2018  9:30 AM  Resp    SpO2 89 % 05/10/2018  9:30 AM  Vitals shown include unvalidated device data.  Last Pain:  Vitals:   05/10/18 0647  TempSrc: Oral  PainSc: 0-No pain      Patients Stated Pain Goal: 7 (82/50/[id number])  Complications: No apparent anesthesia complications

## 2018-05-10 NOTE — Anesthesia Preprocedure Evaluation (Signed)
Anesthesia Evaluation    Airway Mallampati: II       Dental   Pulmonary    breath sounds clear to auscultation       Cardiovascular  Rhythm:regular     Neuro/Psych Anxiety    GI/Hepatic   Endo/Other  diabetes, Type 2  Renal/GU      Musculoskeletal   Abdominal   Peds  Hematology   Anesthesia Other Findings Reports nausea after vasectomy  Reproductive/Obstetrics                             Anesthesia Physical Anesthesia Plan  ASA: III  Anesthesia Plan: MAC   Post-op Pain Management:    Induction:   PONV Risk Score and Plan:   Airway Management Planned:   Additional Equipment:   Intra-op Plan:   Post-operative Plan:   Informed Consent: I have reviewed the patients History and Physical, chart, labs and discussed the procedure including the risks, benefits and alternatives for the proposed anesthesia with the patient or authorized representative who has indicated his/her understanding and acceptance.     Dental Advisory Given  Plan Discussed with: Anesthesiologist  Anesthesia Plan Comments:         Anesthesia Quick Evaluation

## 2018-05-10 NOTE — Anesthesia Postprocedure Evaluation (Signed)
Anesthesia Post Note  Patient: VERN GUERETTE  Procedure(s) Performed: PARTIAL 5TH RAY AMPUTATION RIGHT FOOT (Right Foot) APPLICATION OF WOUND VAC RIGHT FOOT (Right Foot) DEBRIDEMENT PUNCTURE WOUND (Right Foot) DEBRIDEMENT ULCERATION LEFT FOOT (Left Foot)  Patient location during evaluation: PACU Anesthesia Type: MAC Level of consciousness: awake and alert Pain management: pain level controlled Vital Signs Assessment: post-procedure vital signs reviewed and stable Respiratory status: spontaneous breathing, nonlabored ventilation and respiratory function stable Cardiovascular status: blood pressure returned to baseline Postop Assessment: no apparent nausea or vomiting Anesthetic complications: no     Last Vitals:  Vitals:   05/10/18 0647 05/10/18 0930  BP: 122/87 119/87  Pulse: 89 95  Resp: 16 14  Temp: 36.8 C 36.6 C  SpO2: 100% 96%    Last Pain:  Vitals:   05/10/18 0647  TempSrc: Oral  PainSc: 0-No pain                 Shermar Friedland J

## 2018-05-10 NOTE — Brief Op Note (Addendum)
BRIEF OPERATIVE NOTE  DATE OF PROCEDURE 05/10/2018  SURGEON Marcheta Grammes, DPM  ASSISTANT SURGEON None  OR STAFF Circulator: Cox, Gershon Mussel, RN; Paulo Fruit, RN Scrub Person: Lucie Leather, CST   PREOPERATIVE DIAGNOSIS 1.  Chronic osteomyelitis of the fifth ray, right foot 2.  Ulceration, right foot 3.  Puncture wound, right foot 4.  Ulceration, left foot 5.  Diabetes mellitus with peripheral neuropathy  POSTOPERATIVE DIAGNOSIS Same  PROCEDURE 1.  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  Application of negative pressure wound therapy device (CPT [id number]) 3.  Selective debridement of puncture wound of right foot (CPT 97597) 4.  Excisional debridement of ulceration of left foot (CPT 11042)  ANESTHESIA Monitor Anesthesia Care   HEMOSTASIS Pneumatic ankle tourniquet set at 250 mmHg  ESTIMATED BLOOD LOSS Minimal (<5 cc)  MATERIALS USED 1.  White KCI wound VAC sponge x1 piece 2.  Black KCI wound VAC sponge x3 pieces  INJECTABLES 0.5% Marcaine plain  PATHOLOGY 1.  Aerobic culture of amputation site, right foot 2.  Anaerobic culture of amputation site, right foot 3.  Fifth digit and distal fifth metatarsal, right foot 4.  Bone wafer from fifth metatarsal for margins, right foot  COMPLICATIONS None

## 2018-05-11 ENCOUNTER — Encounter (HOSPITAL_COMMUNITY): Payer: Self-pay | Admitting: Podiatry

## 2018-05-16 LAB — AEROBIC/ANAEROBIC CULTURE W GRAM STAIN (SURGICAL/DEEP WOUND)

## 2018-05-16 LAB — AEROBIC/ANAEROBIC CULTURE (SURGICAL/DEEP WOUND)

## 2018-10-20 ENCOUNTER — Other Ambulatory Visit (HOSPITAL_COMMUNITY): Payer: Self-pay | Admitting: Podiatry

## 2018-10-20 ENCOUNTER — Other Ambulatory Visit (HOSPITAL_COMMUNITY)
Admission: RE | Admit: 2018-10-20 | Discharge: 2018-10-20 | Disposition: A | Discharge status: Home / Self Care | Payer: BC Managed Care – PPO | Source: Ambulatory Visit | Attending: Podiatry | Admitting: Podiatry

## 2018-10-20 ENCOUNTER — Other Ambulatory Visit: Payer: Self-pay

## 2018-10-20 ENCOUNTER — Other Ambulatory Visit: Payer: Self-pay | Admitting: Podiatry

## 2018-10-20 DIAGNOSIS — E1142 Type 2 diabetes mellitus with diabetic polyneuropathy: Secondary | ICD-10-CM | POA: Insufficient documentation

## 2018-10-20 DIAGNOSIS — L97523 Non-pressure chronic ulcer of other part of left foot with necrosis of muscle: Secondary | ICD-10-CM | POA: Diagnosis present

## 2018-10-20 DIAGNOSIS — M869 Osteomyelitis, unspecified: Secondary | ICD-10-CM

## 2018-10-20 LAB — CBC WITH DIFFERENTIAL/PLATELET
Abs Immature Granulocytes: 0.04 10*3/uL (ref 0.00–0.07)
Basophils Absolute: 0.1 10*3/uL (ref 0.0–0.1)
Basophils Relative: 1 %
Eosinophils Absolute: 0.3 10*3/uL (ref 0.0–0.5)
Eosinophils Relative: 3 %
HCT: 43.3 % (ref 39.0–52.0)
Hemoglobin: 13 g/dL (ref 13.0–17.0)
Immature Granulocytes: 1 %
Lymphocytes Relative: 28 %
Lymphs Abs: 2.4 10*3/uL (ref 0.7–4.0)
MCH: 26 pg (ref 26.0–34.0)
MCHC: 30 g/dL (ref 30.0–36.0)
MCV: 86.6 fL (ref 80.0–100.0)
Monocytes Absolute: 0.7 10*3/uL (ref 0.1–1.0)
Monocytes Relative: 8 %
Neutro Abs: 5 10*3/uL (ref 1.7–7.7)
Neutrophils Relative %: 59 %
Platelets: 480 10*3/uL — ABNORMAL HIGH (ref 150–400)
RBC: 5 MIL/uL (ref 4.22–5.81)
RDW: 14.9 % (ref 11.5–15.5)
WBC: 8.5 10*3/uL (ref 4.0–10.5)
nRBC: 0 % (ref 0.0–0.2)

## 2018-10-20 LAB — COMPREHENSIVE METABOLIC PANEL
ALT: 13 U/L (ref 0–44)
AST: 11 U/L — ABNORMAL LOW (ref 15–41)
Albumin: 3.5 g/dL (ref 3.5–5.0)
Alkaline Phosphatase: 78 U/L (ref 38–126)
Anion gap: 6 (ref 5–15)
BUN: 16 mg/dL (ref 6–20)
CO2: 30 mmol/L (ref 22–32)
Calcium: 8.8 mg/dL — ABNORMAL LOW (ref 8.9–10.3)
Chloride: 105 mmol/L (ref 98–111)
Creatinine, Ser: 0.81 mg/dL (ref 0.61–1.24)
GFR calc Af Amer: >60 mL/min (ref >60)
GFR calc non Af Amer: >60 mL/min (ref >60)
Glucose, Bld: 114 mg/dL — ABNORMAL HIGH (ref 70–99)
Potassium: 4.3 mmol/L (ref 3.5–5.1)
Sodium: 141 mmol/L (ref 135–145)
Total Bilirubin: 0.5 mg/dL (ref 0.3–1.2)
Total Protein: 7.3 g/dL (ref 6.5–8.1)

## 2018-10-20 LAB — HEMOGLOBIN A1C
Hgb A1c MFr Bld: 6.6 % — ABNORMAL HIGH (ref 4.8–5.6)
Mean Plasma Glucose: 142.72 mg/dL

## 2018-10-20 LAB — SEDIMENTATION RATE: Sed Rate: 32 mm/hr — ABNORMAL HIGH (ref 0–16)

## 2018-10-20 LAB — C-REACTIVE PROTEIN: CRP: 4.5 mg/dL — ABNORMAL HIGH (ref <1.0)

## 2018-10-23 ENCOUNTER — Ambulatory Visit (HOSPITAL_COMMUNITY)
Admission: RE | Admit: 2018-10-23 | Discharge: 2018-10-23 | Disposition: A | Discharge status: Home / Self Care | Payer: BC Managed Care – PPO | Source: Ambulatory Visit | Attending: Podiatry | Admitting: Podiatry

## 2018-10-23 ENCOUNTER — Other Ambulatory Visit: Payer: Self-pay

## 2018-10-23 DIAGNOSIS — M869 Osteomyelitis, unspecified: Secondary | ICD-10-CM | POA: Insufficient documentation

## 2018-10-23 MED ORDER — GADOBUTROL 1 MMOL/ML IV SOLN
10.0000 mL | Freq: Once | INTRAVENOUS | Status: AC | PRN
  Administered 2018-10-23: 10 mL via INTRAVENOUS

## 2018-10-24 ENCOUNTER — Other Ambulatory Visit: Payer: Self-pay | Admitting: Podiatry

## 2018-10-24 NOTE — Patient Instructions (Signed)
Patrick [name]  10/24/2018     '@[email or web address]'$ @   Your procedure is scheduled on  10/26/2018   Report to [facility] at  [place].M.  Call this number if you have problems the morning of surgery:  [phone]   Remember:  Do not eat or drink after midnight.                        Take these medicines the morning of surgery with A SIP OF WATER Zoloft. DO NOT take any medications for diabetes the morning of your procedure.    Do not wear jewelry, make-up or nail polish.  Do not wear lotions, powders, or perfumes. Please wear deodorant and brush your teeth..  Do not shave 48 hours prior to surgery.  Men may shave face and neck.  Do not bring valuables to the hospital.  [facility] is not responsible for any belongings or valuables.  Contacts, dentures or bridgework may not be worn into surgery.  Leave your suitcase in the car.  After surgery it may be brought to your room.  For patients admitted to the hospital, discharge time will be determined by your treatment team.  Patients discharged the day of surgery will not be allowed to drive home.   Name and phone number of your driver:   family Special instructions:  None  Please read over the following fact sheets that you were given. Anesthesia Post-op Instructions and Care and Recovery After Surgery       Phantom Limb Pain Phantom limb pain is pain in a body part that no longer exists. It usually happens in an arm or leg (extremity) after it has been surgically removed (amputated). Most cases of phantom limb pain are brief. However, it can last for years, and it may be severe and disabling. The exact mechanism of how phantom limb pain occurs is not known. The problem may start in a part of the brain that processes feelings and awareness (sensations) from the rest of the body (sensory cortex). When a body part is lost, the sensory cortex may not be able to handle the loss and may reorganize (rewire) itself to  make up for the lost signals. What are the causes? This condition only happens in patients with amputations, but the cause is not known. It may be caused by:  Damaged nerve endings (peripheral nerves).  Scar tissue.  Rewiring of nerves in the brain or spine (central nerves). What are the signs or symptoms? Symptoms vary and are related to the lost limb or the remaining stump. Stump pain may be mistaken for phantom limb pain, or you may have both at the same time. Symptoms include:  Phantom pain. Pain often feels like the pain you had before the amputation. It usually comes and goes, and it gets better over time. The pain may feel like: ? Burning. ? Stabbing. ? Throbbing. ? Cramping. ? Prickling. ? Crushing. ? It is moving over time from the farthest part of the amputated limb (fingers or toes) up to the site of amputation, as if the limb is shrinking (telescoping).  Phantom sensation. This is a feeling other than pain, as if the limb is still part of the body. This may include sensations of: ? Movement. ? Shape or length. ? Position. Physical or emotional factors can trigger or worsen pain sensations. Those factors may include:  Weather changes.  Stress.  Strong emotions.  Certain positions or movements of the body.  Pressure on the affected area. How is this diagnosed? Diagnosis is based on your history of amputation and symptoms that you have after surgery. Your health care provider may:  Do a physical exam.  Talk with you about your symptoms and past history of pain. You may have imaging tests to examine your stump, such as X-rays or CT scan. How is this treated? There are different therapies and medicines that may give you relief. Work with your health care provider until you have adequate relief. Treatment options may include:  Pain medicine. Medicine can be given for pain right after surgery (acute pain) and for pain that goes on for some time (chronic pain).  Commonly used medicines include: ? Antidepressant medicine. ? Anticonvulsant medicine. ? Narcotics, analgesics, or anti-inflammatory medicine. ? Nerve blocks.  Techniques that help to retrain the brain and nervous system (movement representation techniques), such as: ? Looking at your unaffected limb in a mirror and thinking about painless movement of your extremity (mirror therapy). ? Thinking about moving your limbs without actual movement (motor therapy). ? Watching and sensing the movement of other people (action observation).  Relaxation techniques that use the mind and body to control pain. These often involve guided imagery, deep breathing, and muscle relaxation exercises.  Hypnosis and mental imagery. These techniques can help you focus or concentrate to regain control.  Biofeedback. This involves using monitors that alert you to changes in your breathing, heart rate, skin temperature, or muscle activity, and using relaxation techniques to reverse those changes. Biofeedback tells you if the techniques you are using are effective.  Acupuncture. This involves inserting small needles into certain places on your skin to help relieve pain.  Sensory discrimination training. For this treatment, painless stimulation is applied to different parts of your stump and you describe what you feel. This may help with nerve rewiring.  Physical therapy involving the stump, which may include: ? Exercise. This may be physical movement, or it may involve applying sound waves (ultrasound) or tapping (percussion therapy) to the stump. These exercises may help to heal and retrain tissue and nerves. ? Massage. Stump massage creates new sensations, breaks up scar tissue, and prepares the stump (desensitization) for an artificial limb (prosthesis). ? Heat or cold treatment. This can improve blood flow and reduce inflammation. ? Applying painless electrical pulses to the skin to prevent sensations of pain from  reaching the brain (transcutaneous electrical nerve stimulation, TENS). Follow these instructions at home:   Take over-the-counter and prescription medicines only as told by your health care provider.  Do not drive or use heavy machinery while taking prescription pain medicine.  Do not use any products that contain nicotine or tobacco, such as cigarettes and e-cigarettes. If you need help quitting, ask your health care provider.  Join a support group. Express your feelings and talk with someone you trust.  Seek counseling or talk therapy with a mental health professional. This may be helpful if you are having trouble managing your emotions about losing an extremity.  Keep all follow-up visits as told by your health care providers and therapists. This is important. Contact a health care provider if:  You have a sore on your stump that does not get better with treatment.  Your pain does not improve with medicine or treatment. Get help right away if:  You have suicidal thoughts. If you ever feel like you may hurt yourself or others, or have thoughts  about taking your own life, get help right away. You can go to your nearest emergency department or call:  Your local emergency services (911 in the [place]  A suicide crisis helpline, such as the [place] at (831)714-4215. This is open 24 hours a day. Summary  Phantom limb pain is pain in a body part that no longer exists. It happens in an arm or leg (extremity) after it has been surgically removed (amputated).  Medicines or techniques that help to retrain the brain and nervous system (movement representation techniques) may help to relieve symptoms.  Physical therapy for phantom limb pain may involve exercise, massage, heat or cold therapy, or painless stimulation of the skin (transcutaneous electrical nerve stimulation, TENS). This information is not intended to replace advice given to you by your health care  provider. Make sure you discuss any questions you have with your health care provider. Document Released: 03/27/2002 Document Revised: 12/17/2016 Document Reviewed: 02/13/2016 Elsevier Patient Education  2020 [place] With an Amputation An amputation is a surgical procedure to remove all or part of an arm or leg (limb). After this surgery, it will take time for you to heal and get used to living with the amputation. There are things you can do to help yourself adjust. Living with an amputation can be challenging, but it is often possible to do all of the activities that you used to do. How to manage lifestyle changes      Return to your normal activities as told by your health care provider. Ask your health care provider what activities are safe for you.  You may choose to have an artificial limb (prosthesis) made for you. Use and care for your prosthesis as told by your health care provider.  Discuss all of your leisure interests with your health care provider and prosthesis specialist. Equipment changes can often be made, which may allow you to return to a sport or hobby. Some Teacher, English as a foreign language for this purpose.  Talk with your health care team about returning to work. ? When you are ready to return to work, your therapists can evaluate your job site and make recommendations to help you do your job. ? If you are not able to return to the same job, [employer] can help you with job retraining.  Talk with your health care provider about returning to driving. You may: ? Work with an occupational therapist to learn new ways for safely driving with an amputation. ? Work with a prosthesis specialist or physical therapist to identify assistive devices for safe driving. How to recognize stress Some common challenges of living with an amputation may bring about stress. These issues are normal. They include:  Changes in how you  move around.  Changes in how you care for yourself.  Changes in how you participate in leisure activities.  Emotions after the amputation, such as grief and anger.  Issues with body image and weight.  Problems with pain and treatment. How to manage stress Use these strategies to ease stress related to the daily challenges of living with an amputation:  Be aware of any sources of your stress. Monitor yourself for symptoms of stress. Identify what challenges cause the most stress for you.  Rethink the situation. Try to: ? Think realistically about stressful challenges. Do not ignore these challenges. Do not overreact to them. ? Try to find the positives in a stressful situation. Do not focus on  the negatives.  Talk about your emotional challenges, such as grief, with a mental health professional.  Connect with other people who have gone through the same experience.  Find ways to help relax your body and mind. Examples include: ? Meditation, deep breathing, or progressive relaxation techniques. ? Hypnosis or guided imagery. ? Yoga or tai chi. ? Biofeedback or mindfulness techniques. ? Keeping a journal. ? Doing hobbies, like listening to music or being out in nature. ? Exercise. Talk with your rehabilitation team about the best way to get 30 minutes of physical activity at least 5 days a week. Where to find support: Talking to others  Look for a local or online support group for people living with an amputation. Finances  Talk to your insurance company about what assistive devices your plan covers.  Contact national or local amputee charities to find out if you are eligible for scholarships and medical equipment for people in need.  People in [employer] service may be eligible for financial assistance or programs that support amputees. Rehabilitation A rehabilitation program can help you regain mobility and independence. Your rehabilitation team may include:  Physicians and  nurses.  Physical and occupational therapists.  Prosthesis specialists.  Social workers.  Mental health professionals.  Diet and nutrition specialists. Follow these instructions at home: Managing pain  Work with your health care provider to manage any residual or phantom limb pain. This may include: ? Pain medicine. ? Physical therapy. ? Techniques that help retrain the brain and nervous system (movement representation techniques). ? Control and instrumentation engineer. For this treatment, stimulation is applied to different parts of your stump, and you describe what you feel. ? Biofeedback. This involves using monitors that alert you to changes in your breathing, heart rate, skin temperature, or muscle activity, and using relaxation techniques to reverse those changes. ? Acupuncture. Eating and drinking  Work with a dietitian to develop an eating plan that helps you maintain a healthy body weight. Your plan may include a balance of: ? Fresh fruits and vegetables. ? Whole grains. ? Lean meats. ? Low-fat dairy. ? Healthy fats, such as fish, nuts, avocado, or olive oil.  Do not drink alcohol to cope with stress. General instructions  Take over-the-counter and prescription medicines only as told by your health care provider.  Do not use any products that contain nicotine or tobacco, such as cigarettes and e-cigarettes. If you need help quitting, ask your health care provider.  Keep all follow-up visits as told by your health care provider. This is important. Where to find more information You can find more information about living with an amputation from:  Amputee Coalition: www.amputee-coalition.org  Amputee Support Groups: amputee.supportgroups.[place]: www.nationalamputation.[facility] on Health, Physical Activity and Disability: www.nchpad.org  [employer] [place]: www.disabledsports[place].org Contact a health care provider if:  Your  pain does not improve with medicine or treatment.  You have trouble managing your emotions about losing an extremity. Get help right away if you:  Have severe pain. Summary  It will take time for you to heal and get used to living with an amputation.  Look for a local or online support group for people living with an amputation.  Work with your health care provider to manage any phantom limb pain. This information is not intended to replace advice given to you by your health care provider. Make sure you discuss any questions you have with your health care provider. Document Released: 09/26/2001 Document Revised: 04/28/2018 Document  Reviewed: 12/01/2016 Elsevier Patient Education  [place].  Toe Amputation, Care After This sheet gives you information about how to care for yourself after your procedure. Your health care provider may also give you more specific instructions. If you have problems or questions, contact your health care provider. What can I expect after the procedure? After the procedure, it is common to have some pain. Pain usually improves within a week. Follow these instructions at home: Medicines  Take over-the-counter and prescription medicines only as told by your health care provider.  If you were prescribed an antibiotic medicine, take it as told by your health care provider. Do not stop taking the antibiotic even if you start to feel better.  Ask your health care provider if the medicine prescribed to you: ? Requires you to avoid driving or using heavy machinery. ? Can cause constipation. You may need to take actions to prevent or treat constipation, such as:  Drink enough fluid to keep your urine pale yellow.  Take over-the-counter or prescription medicines.  Eat foods that are high in fiber, such as beans, whole grains, and fresh fruits and vegetables.  Limit foods that are high in fat and processed sugars, such as fried or sweet foods. Managing  pain, stiffness, and swelling   If directed, put ice on the painful area. ? Put ice in a plastic bag. ? Place a towel between your skin and the bag. ? Leave the ice on for 20 minutes, 2-3 times a day.  Move your other toes often to reduce stiffness and swelling.  Raise (elevate) your foot above the level of your heart while you are sitting or lying down. Incision care      Follow instructions from your health care provider about how to take care of your incision. Make sure you: ? Wash your hands with soap and water before and after you change your bandage (dressing). If soap and water are not available, use hand sanitizer. ? Change your dressing as told by your health care provider. ? Leave stitches (sutures), skin glue, or adhesive strips in place. These skin closures may need to stay in place for 2 weeks or longer. If adhesive strip edges start to loosen and curl up, you may trim the loose edges. Do not remove adhesive strips completely unless your health care provider tells you to do that.  Check your incision area every day for signs of infection. Check for: ? More redness, swelling, or pain. ? Fluid or blood. ? Warmth. ? Pus or a bad smell. Bathing  Do not take baths, swim, use a hot tub, or soak your foot until your health care provider approves. Ask your health care provider if you may take showers. You may only be allowed to take sponge baths.  If your dressing has been removed, you may wash your skin with warm water and soap. Activity  Rest as told by your health care provider.  Avoid sitting for a long time without moving. Get up to take short walks every 1-2 hours. This is important to improve blood flow and breathing. Ask for help if you feel weak or unsteady.  If you have been given crutches, use them as told by your health care provider.  Do exercises as told by your health care provider.  Return to your normal activities as told by your health care provider.  Ask your health care provider what activities are safe for you. General instructions  Do not drive for 24 hours  if you were given a sedative during your procedure.  Do not use any products that contain nicotine or tobacco, such as cigarettes, e-cigarettes, and chewing tobacco. If you need help quitting, ask your health care provider.  Ask your health care provider about wearing supportive shoes or using inserts to help with your balance when walking.  If you have diabetes, keep your blood sugar under good control and check your feet daily for sores or open areas.  Keep all follow-up visits as told by your health care provider. This is important. Contact a health care provider if:  You have signs of infection at your incision, such as: ? Redness, swelling, or pain. ? Fluid or blood. ? Warmth. ? Pus or a bad smell.  You have a fever or chills.  Your dressing is soaked with blood.  Your sutures tear or they separate.  You have numbness or tingling in your toes or foot.  Your foot is cool or pale, or it changes color.  Your pain does not improve after you take your medicine. Get help right away if you have:  Pain or swelling near your incision that gets worse or does not go away.  Red streaks on your skin near your toes, foot, or leg.  Pain in your calf or behind your knee.  Shortness of breath.  Chest pain. Summary  After the procedure, it is common to have some pain. Pain usually improves within a week.  If you were prescribed an antibiotic medicine, take it as told by your health care provider. Do not stop taking the antibiotic even if you start to feel better.  Change your dressing as told by your health care provider.  Contact a health care provider if you have signs of infection.  Keep all follow-up visits as told by your health care provider. This is important. This information is not intended to replace advice given to you by your health care provider. Make  sure you discuss any questions you have with your health care provider. Document Released: 12/16/2014 Document Revised: 04/26/2018 Document Reviewed: 12/27/2017 Elsevier Patient Education  2020 [place] After These instructions provide you with information about caring for yourself after your procedure. Your health care provider may also give you more specific instructions. Your treatment has been planned according to current medical practices, but problems sometimes occur. Call your health care provider if you have any problems or questions after your procedure. What can I expect after the procedure? After your procedure, you may:  Feel sleepy for several hours.  Feel clumsy and have poor balance for several hours.  Feel forgetful about what happened after the procedure.  Have poor judgment for several hours.  Feel nauseous or vomit.  Have a sore throat if you had a breathing tube during the procedure. Follow these instructions at home: For at least 24 hours after the procedure:      Have a responsible adult stay with you. It is important to have someone help care for you until you are awake and alert.  Rest as needed.  Do not: ? Participate in activities in which you could fall or become injured. ? Drive. ? Use heavy machinery. ? Drink alcohol. ? Take sleeping pills or medicines that cause drowsiness. ? Make important decisions or sign legal documents. ? Take care of children on your own. Eating and drinking  Follow the diet that is recommended by your health care provider.  If you vomit, drink water,  juice, or soup when you can drink without vomiting.  Make sure you have little or no nausea before eating solid foods. General instructions  Take over-the-counter and prescription medicines only as told by your health care provider.  If you have sleep apnea, surgery and certain medicines can increase your risk for breathing  problems. Follow instructions from your health care provider about wearing your sleep device: ? Anytime you are sleeping, including during daytime naps. ? While taking prescription pain medicines, sleeping medicines, or medicines that make you drowsy.  If you smoke, do not smoke without supervision.  Keep all follow-up visits as told by your health care provider. This is important. Contact a health care provider if:  You keep feeling nauseous or you keep vomiting.  You feel light-headed.  You develop a rash.  You have a fever. Get help right away if:  You have trouble breathing. Summary  For several hours after your procedure, you may feel sleepy and have poor judgment.  Have a responsible adult stay with you for at least 24 hours or until you are awake and alert. This information is not intended to replace advice given to you by your health care provider. Make sure you discuss any questions you have with your health care provider. Document Released: 04/27/2015 Document Revised: 04/04/2017 Document Reviewed: 04/27/2015 Elsevier Patient Education  2020 [employer].

## 2018-10-25 ENCOUNTER — Encounter (HOSPITAL_COMMUNITY)
Admission: RE | Admit: 2018-10-25 | Discharge: 2018-10-25 | Disposition: A | Discharge status: Home / Self Care | Payer: BC Managed Care – PPO | Source: Ambulatory Visit | Attending: Podiatry | Admitting: Podiatry

## 2018-10-25 ENCOUNTER — Other Ambulatory Visit: Payer: Self-pay

## 2018-10-25 ENCOUNTER — Encounter (HOSPITAL_COMMUNITY): Payer: Self-pay

## 2018-10-25 ENCOUNTER — Other Ambulatory Visit (HOSPITAL_COMMUNITY)
Admission: RE | Admit: 2018-10-25 | Discharge: 2018-10-25 | Disposition: A | Discharge status: Home / Self Care | Payer: BC Managed Care – PPO | Source: Ambulatory Visit | Attending: Podiatry | Admitting: Podiatry

## 2018-10-25 DIAGNOSIS — Z01812 Encounter for preprocedural laboratory examination: Secondary | ICD-10-CM | POA: Insufficient documentation

## 2018-10-25 DIAGNOSIS — M86172 Other acute osteomyelitis, left ankle and foot: Secondary | ICD-10-CM | POA: Insufficient documentation

## 2018-10-25 DIAGNOSIS — Z20828 Contact with and (suspected) exposure to other viral communicable diseases: Secondary | ICD-10-CM | POA: Diagnosis not present

## 2018-10-25 DIAGNOSIS — L97529 Non-pressure chronic ulcer of other part of left foot with unspecified severity: Secondary | ICD-10-CM | POA: Insufficient documentation

## 2018-10-25 HISTORY — DX: Unspecified osteoarthritis, unspecified site: M19.90

## 2018-10-25 LAB — SARS CORONAVIRUS 2 (TAT 6-24 HRS): SARS Coronavirus 2: NEGATIVE

## 2018-10-26 ENCOUNTER — Encounter (HOSPITAL_COMMUNITY): Payer: Self-pay | Admitting: *Deleted

## 2018-10-26 ENCOUNTER — Ambulatory Visit (HOSPITAL_COMMUNITY): Payer: BC Managed Care – PPO | Admitting: Anesthesiology

## 2018-10-26 ENCOUNTER — Ambulatory Visit (HOSPITAL_COMMUNITY): Payer: BC Managed Care – PPO

## 2018-10-26 ENCOUNTER — Ambulatory Visit (HOSPITAL_COMMUNITY)
Admission: RE | Admit: 2018-10-26 | Discharge: 2018-10-26 | Disposition: A | Discharge status: Home / Self Care | Payer: BC Managed Care – PPO | Attending: Podiatry | Admitting: Podiatry

## 2018-10-26 ENCOUNTER — Encounter (HOSPITAL_COMMUNITY)
Admission: RE | Disposition: A | Discharge status: Home / Self Care | Payer: Self-pay | Source: Home / Self Care | Attending: Podiatry

## 2018-10-26 DIAGNOSIS — Z79899 Other long term (current) drug therapy: Secondary | ICD-10-CM | POA: Insufficient documentation

## 2018-10-26 DIAGNOSIS — L97521 Non-pressure chronic ulcer of other part of left foot limited to breakdown of skin: Secondary | ICD-10-CM | POA: Insufficient documentation

## 2018-10-26 DIAGNOSIS — E1169 Type 2 diabetes mellitus with other specified complication: Secondary | ICD-10-CM | POA: Diagnosis present

## 2018-10-26 DIAGNOSIS — Z7984 Long term (current) use of oral hypoglycemic drugs: Secondary | ICD-10-CM | POA: Insufficient documentation

## 2018-10-26 DIAGNOSIS — E11621 Type 2 diabetes mellitus with foot ulcer: Secondary | ICD-10-CM | POA: Diagnosis not present

## 2018-10-26 DIAGNOSIS — M86172 Other acute osteomyelitis, left ankle and foot: Secondary | ICD-10-CM | POA: Insufficient documentation

## 2018-10-26 DIAGNOSIS — E1140 Type 2 diabetes mellitus with diabetic neuropathy, unspecified: Secondary | ICD-10-CM | POA: Insufficient documentation

## 2018-10-26 DIAGNOSIS — F419 Anxiety disorder, unspecified: Secondary | ICD-10-CM | POA: Insufficient documentation

## 2018-10-26 DIAGNOSIS — Z9889 Other specified postprocedural states: Secondary | ICD-10-CM

## 2018-10-26 HISTORY — PX: WOUND DEBRIDEMENT: SHX247

## 2018-10-26 HISTORY — PX: AMPUTATION: SHX166

## 2018-10-26 LAB — GLUCOSE, CAPILLARY
Glucose-Capillary: 138 mg/dL — ABNORMAL HIGH (ref 70–99)
Glucose-Capillary: 133 mg/dL — ABNORMAL HIGH (ref 70–99)

## 2018-10-26 SURGERY — AMPUTATION, FOOT, RAY
Anesthesia: Monitor Anesthesia Care | Site: Foot | Laterality: Left

## 2018-10-26 MED ORDER — MIDAZOLAM HCL 5 MG/5ML IJ SOLN
INTRAMUSCULAR | Status: DC | PRN
  Administered 2018-10-26: 2 mg via INTRAVENOUS

## 2018-10-26 MED ORDER — CLINDAMYCIN PHOSPHATE 600 MG/50ML IV SOLN
600.0000 mg | Freq: Once | INTRAVENOUS | Status: AC
  Administered 2018-10-26: 600 mg via INTRAVENOUS
  Filled 2018-10-26: qty 50

## 2018-10-26 MED ORDER — GLYCOPYRROLATE 0.2 MG/ML IJ SOLN
INTRAMUSCULAR | Status: DC | PRN
  Administered 2018-10-26: 0.2 mg via INTRAVENOUS

## 2018-10-26 MED ORDER — MEPERIDINE HCL 50 MG/ML IJ SOLN: 6.2500 mg | INTRAMUSCULAR | Status: DC | PRN

## 2018-10-26 MED ORDER — PROPOFOL 500 MG/50ML IV EMUL
INTRAVENOUS | Status: DC | PRN
  Administered 2018-10-26: 150 ug/kg/min via INTRAVENOUS
  Administered 2018-10-26: 08:00:00 via INTRAVENOUS

## 2018-10-26 MED ORDER — PROPOFOL 10 MG/ML IV BOLUS
INTRAVENOUS | Status: DC | PRN
  Administered 2018-10-26: 20 mg via INTRAVENOUS

## 2018-10-26 MED ORDER — MIDAZOLAM HCL 2 MG/2ML IJ SOLN
INTRAMUSCULAR | Status: AC
  Filled 2018-10-26: qty 2

## 2018-10-26 MED ORDER — DEXAMETHASONE SODIUM PHOSPHATE 4 MG/ML IJ SOLN
INTRAMUSCULAR | Status: DC | PRN
  Administered 2018-10-26: 4 mg via INTRAVENOUS

## 2018-10-26 MED ORDER — 0.9 % SODIUM CHLORIDE (POUR BTL) OPTIME
TOPICAL | Status: DC | PRN
  Administered 2018-10-26: 08:00:00 1000 mL

## 2018-10-26 MED ORDER — LIDOCAINE HCL (PF) 1 % IJ SOLN
INTRAMUSCULAR | Status: AC
  Filled 2018-10-26: qty 30

## 2018-10-26 MED ORDER — FENTANYL CITRATE (PF) 100 MCG/2ML IJ SOLN
INTRAMUSCULAR | Status: DC | PRN
  Administered 2018-10-26: 25 ug via INTRAVENOUS

## 2018-10-26 MED ORDER — ONDANSETRON HCL 4 MG/2ML IJ SOLN: 4.0000 mg | Freq: Once | INTRAMUSCULAR | Status: DC | PRN

## 2018-10-26 MED ORDER — LIDOCAINE HCL (CARDIAC) PF 100 MG/5ML IV SOSY
PREFILLED_SYRINGE | INTRAVENOUS | Status: DC | PRN
  Administered 2018-10-26: 40 mg via INTRAVENOUS

## 2018-10-26 MED ORDER — LACTATED RINGERS IV SOLN
INTRAVENOUS | Status: DC | PRN
  Administered 2018-10-26: 07:00:00 via INTRAVENOUS

## 2018-10-26 MED ORDER — FENTANYL CITRATE (PF) 100 MCG/2ML IJ SOLN
INTRAMUSCULAR | Status: AC
  Filled 2018-10-26: qty 2

## 2018-10-26 MED ORDER — LACTATED RINGERS IV SOLN
Freq: Once | INTRAVENOUS | Status: AC
  Administered 2018-10-26: 07:00:00 1000 mL via INTRAVENOUS

## 2018-10-26 MED ORDER — BUPIVACAINE HCL (PF) 0.5 % IJ SOLN
INTRAMUSCULAR | Status: DC | PRN
  Administered 2018-10-26: 10 mL

## 2018-10-26 MED ORDER — BUPIVACAINE HCL (PF) 0.5 % IJ SOLN
INTRAMUSCULAR | Status: AC
  Filled 2018-10-26: qty 30

## 2018-10-26 MED ORDER — ONDANSETRON HCL 4 MG/2ML IJ SOLN
INTRAMUSCULAR | Status: DC | PRN
  Administered 2018-10-26: 4 mg via INTRAVENOUS

## 2018-10-26 MED ORDER — PROPOFOL 10 MG/ML IV BOLUS
INTRAVENOUS | Status: AC
  Filled 2018-10-26: qty 40

## 2018-10-26 MED ORDER — HYDROMORPHONE HCL 1 MG/ML IJ SOLN: 0.2500 mg | INTRAMUSCULAR | Status: DC | PRN

## 2018-10-26 SURGICAL SUPPLY — 46 items
BANDAGE ELASTIC 4 VELCRO NS (GAUZE/BANDAGES/DRESSINGS) ×4 IMPLANT
BANDAGE ESMARK 4X12 BL STRL LF (DISPOSABLE) ×2 IMPLANT
BENZOIN TINCTURE PRP APPL 2/3 (GAUZE/BANDAGES/DRESSINGS) ×4 IMPLANT
BLADE AVERAGE 25MMX9MM (BLADE) ×1
BLADE AVERAGE 25X9 (BLADE) ×3 IMPLANT
BLADE SURG 15 STRL LF DISP TIS (BLADE) ×2 IMPLANT
BLADE SURG 15 STRL SS (BLADE) ×4
BNDG CONFORM 2 STRL LF (GAUZE/BANDAGES/DRESSINGS) ×6 IMPLANT
BNDG ELASTIC 4X5.8 VLCR NS LF (GAUZE/BANDAGES/DRESSINGS) ×4 IMPLANT
BNDG ESMARK 4X12 BLUE STRL LF (DISPOSABLE) ×4
BNDG GAUZE ELAST 4 BULKY (GAUZE/BANDAGES/DRESSINGS) ×4 IMPLANT
CLOSURE WOUND 1/2 X4 (GAUZE/BANDAGES/DRESSINGS) ×1
CLOTH BEACON ORANGE TIMEOUT ST (SAFETY) ×4 IMPLANT
COVER LIGHT HANDLE STERIS (MISCELLANEOUS) ×8 IMPLANT
COVER WAND RF STERILE (DRAPES) ×4 IMPLANT
CUFF TOURN SGL QUICK 18X4 (TOURNIQUET CUFF) ×2 IMPLANT
DECANTER SPIKE VIAL GLASS SM (MISCELLANEOUS) ×4 IMPLANT
DRAPE OEC MINIVIEW 54X84 (DRAPES) ×4 IMPLANT
DRSG ADAPTIC 3X8 NADH LF (GAUZE/BANDAGES/DRESSINGS) ×4 IMPLANT
ELECT REM PT RETURN 9FT ADLT (ELECTROSURGICAL) ×4
ELECTRODE REM PT RTRN 9FT ADLT (ELECTROSURGICAL) ×2 IMPLANT
GAUZE SPONGE 4X4 12PLY STRL (GAUZE/BANDAGES/DRESSINGS) ×6 IMPLANT
GLOVE BIO SURGEON STRL SZ7.5 (GLOVE) ×4 IMPLANT
GLOVE BIOGEL PI IND STRL 7.0 (GLOVE) ×4 IMPLANT
GLOVE BIOGEL PI INDICATOR 7.0 (GLOVE) ×4
GLOVE ECLIPSE 6.5 STRL STRAW (GLOVE) ×2 IMPLANT
GOWN STRL REUS W/TWL LRG LVL3 (GOWN DISPOSABLE) ×8 IMPLANT
KIT TURNOVER KIT A (KITS) ×4 IMPLANT
MANIFOLD NEPTUNE II (INSTRUMENTS) ×4 IMPLANT
MARKER SKIN DUAL TIP RULER LAB (MISCELLANEOUS) ×4 IMPLANT
NDL HYPO 27GX1-1/4 (NEEDLE) ×8 IMPLANT
NEEDLE HYPO 27GX1-1/4 (NEEDLE) ×8 IMPLANT
NS IRRIG 1000ML POUR BTL (IV SOLUTION) ×4 IMPLANT
PACK BASIC LIMB (CUSTOM PROCEDURE TRAY) ×4 IMPLANT
PAD ARMBOARD 7.5X6 YLW CONV (MISCELLANEOUS) ×4 IMPLANT
PAD CAST 4YDX4 CTTN HI CHSV (CAST SUPPLIES) IMPLANT
PADDING CAST COTTON 4X4 STRL (CAST SUPPLIES) ×2
SET BASIN LINEN APH (SET/KITS/TRAYS/PACK) ×4 IMPLANT
SOL PREP PROV IODINE SCRUB 4OZ (MISCELLANEOUS) ×4 IMPLANT
SPONGE LAP 18X18 RF (DISPOSABLE) ×4 IMPLANT
STRIP CLOSURE SKIN 1/2X4 (GAUZE/BANDAGES/DRESSINGS) ×5 IMPLANT
SUT ETHILON 3 0 PS 1 (SUTURE) ×6 IMPLANT
SUT VIC AB 4-0 PS2 27 (SUTURE) ×2 IMPLANT
SUT VICRYL AB 3-0 FS1 BRD 27IN (SUTURE) ×4 IMPLANT
SYR CONTROL 10ML LL (SYRINGE) ×8 IMPLANT
TOWEL OR 17X26 4PK STRL BLUE (TOWEL DISPOSABLE) ×4 IMPLANT

## 2018-10-26 NOTE — Anesthesia Postprocedure Evaluation (Signed)
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 LEFT FOOT (Left Foot)  Patient location during evaluation: PACU Anesthesia Type: MAC Level of consciousness: awake and alert, oriented and patient cooperative Pain management: pain level controlled Vital Signs Assessment: post-procedure vital signs reviewed and stable Respiratory status: spontaneous breathing Cardiovascular status: stable Postop Assessment: no apparent nausea or vomiting Anesthetic complications: no     Last Vitals:  Vitals:   10/26/18 0653  BP: (!) 142/99  Pulse: 84  Resp: 18  Temp: 36.8 C  SpO2: 97%    Last Pain:  Vitals:   10/26/18 0653  TempSrc: Oral  PainSc: 0-No pain                 [name] A

## 2018-10-26 NOTE — Transfer of Care (Signed)
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  Last Pain:  Vitals:   10/26/18 0653  TempSrc: Oral  PainSc: 0-No pain      Patients Stated Pain Goal: 8 (16/38/46 6599)  Complications: No apparent anesthesia complications

## 2018-10-26 NOTE — Anesthesia Procedure Notes (Signed)
Procedure Name: MAC Date/Time: 10/26/2018 7:24 AM Performed by: Andree Elk Amarii  A, CRNA Pre-anesthesia Checklist: Patient identified, Emergency Drugs available, Suction available, Patient being monitored and Timeout performed Patient Re-evaluated:Patient Re-evaluated prior to induction Oxygen Delivery Method: Non-rebreather mask

## 2018-10-26 NOTE — Anesthesia Preprocedure Evaluation (Addendum)
Anesthesia Evaluation  Patient identified by MRN, date of birth, ID band Patient awake    Reviewed: Allergy & Precautions, NPO status , Patient's Chart, lab work & pertinent test results  History of Anesthesia Complications (+) PONV and history of anesthetic complications  Airway Mallampati: II  TM Distance: >3 FB Neck ROM: Full    Dental  (+) Poor Dentition, Missing, Chipped, Dental Advisory Given   Pulmonary    Pulmonary exam normal breath sounds clear to auscultation       Cardiovascular negative cardio ROS Normal cardiovascular exam Rhythm:Regular Rate:Normal     Neuro/Psych Anxiety  Neuromuscular disease    GI/Hepatic negative GI ROS, Neg liver ROS,   Endo/Other  diabetes, Well Controlled, Type 2  Renal/GU negative Renal ROS  negative genitourinary   Musculoskeletal  (+) Arthritis , Osteoarthritis,    Abdominal   Peds  Hematology   Anesthesia Other Findings   Reproductive/Obstetrics                           Anesthesia Physical Anesthesia Plan  ASA: III  Anesthesia Plan: MAC   Post-op Pain Management:    Induction: Intravenous  PONV Risk Score and Plan: 2 and Ondansetron, TIVA and Midazolam  Airway Management Planned: Natural Airway, Nasal Cannula and Simple Face Mask  Additional Equipment:   Intra-op Plan:   Post-operative Plan:   Informed Consent: I have reviewed the patients History and Physical, chart, labs and discussed the procedure including the risks, benefits and alternatives for the proposed anesthesia with the patient or authorized representative who has indicated his/her understanding and acceptance.     Dental advisory given  Plan Discussed with: CRNA  Anesthesia Plan Comments:       Anesthesia Quick Evaluation

## 2018-10-26 NOTE — Discharge Instructions (Signed)
These instructions will give you an idea of what to expect after surgery and how to manage issues that may arise before your first post op office visit.  Pain Management Pain is best managed by staying ahead of it. If pain gets out of control, it is difficult to get it back under control. Local anesthesia that lasts 6-8 hours is used to numb the foot and decrease pain.  For the best pain control, take the pain medication every 4 hours for the first 2 days post op. On the third day pain medication can be taken as needed.   Post Op Nausea Nausea is common after surgery, so it is managed proactively.  If prescribed, use the prescribed nausea medication regularly for the first 2 days post op.  Bandages Do not worry if there is blood on the bandage. What looks like a lot of blood on the bandage is actually a small amount. Blood on the dressing spreads out as it is absorbed by the gauze, the same way a drop of water spreads out on a paper towel.  If the bandages feel wet or dry, stiff and uncomfortable, call the office during office hours and we will schedule a time for you to have the bandage changed.  Unless you are specifically told otherwise, we will do the first bandage change in the office.  Keep your bandage dry. If the bandage becomes wet or soiled, notify the office and we will schedule a time to change the bandage.  Activity It is best to spend most of the first 2 days after surgery lying down with the foot elevated above the level of your heart. You may put weight on your heel while wearing the surgical shoe.   You may only get up to go to the restroom.  Driving Do not drive until you are able to respond in an emergency (i.e. slam on the brakes). This usually occurs after the bone has healed - 6 to 8 weeks.  Call the Office If you have a fever over 101F.  If you have increasing pain after the initial post op pain has settled down.  If you have increasing redness, swelling, or  drainage.  If you have any questions or concerns.       Monitored Anesthesia Care, Care After These instructions provide you with information about caring for yourself after your procedure. Your health care provider may also give you more specific instructions. Your treatment has been planned according to current medical practices, but problems sometimes occur. Call your health care provider if you have any problems or questions after your procedure. What can I expect after the procedure? After your procedure, you may:  Feel sleepy for several hours.  Feel clumsy and have poor balance for several hours.  Feel forgetful about what happened after the procedure.  Have poor judgment for several hours.  Feel nauseous or vomit.  Have a sore throat if you had a breathing tube during the procedure. Follow these instructions at home: For at least 24 hours after the procedure:      Have a responsible adult stay with you. It is important to have someone help care for you until you are awake and alert.  Rest as needed.  Do not: ? Participate in activities in which you could fall or become injured. ? Drive. ? Use heavy machinery. ? Drink alcohol. ? Take sleeping pills or medicines that cause drowsiness. ? Make important decisions or sign legal documents. ? Take care  of children on your own. Eating and drinking  Follow the diet that is recommended by your health care provider.  If you vomit, drink water, juice, or soup when you can drink without vomiting.  Make sure you have little or no nausea before eating solid foods. General instructions  Take over-the-counter and prescription medicines only as told by your health care provider.  If you have sleep apnea, surgery and certain medicines can increase your risk for breathing problems. Follow instructions from your health care provider about wearing your sleep device: ? Anytime you are sleeping, including during daytime  naps. ? While taking prescription pain medicines, sleeping medicines, or medicines that make you drowsy.  If you smoke, do not smoke without supervision.  Keep all follow-up visits as told by your health care provider. This is important. Contact a health care provider if:  You keep feeling nauseous or you keep vomiting.  You feel light-headed.  You develop a rash.  You have a fever. Get help right away if:  You have trouble breathing. Summary  For several hours after your procedure, you may feel sleepy and have poor judgment.  Have a responsible adult stay with you for at least 24 hours or until you are awake and alert. This information is not intended to replace advice given to you by your health care provider. Make sure you discuss any questions you have with your health care provider. Document Released: 04/27/2015 Document Revised: 04/04/2017 Document Reviewed: 04/27/2015 Elsevier Patient Education  2020 [employer].

## 2018-10-26 NOTE — Op Note (Signed)
OPERATIVE NOTE  DATE OF PROCEDURE 10/26/2018  SURGEON Marcheta Grammes, DPM  ASSISTANT SURGEON None  OR STAFF Circulator: Brim, Fransisco Hertz, RN Scrub Person: Lucie Leather, CST Circulator Assistant: Glory Rosebush, RN   PREOPERATIVE DIAGNOSIS 1.  Osteomyelitis of hallux, left foot 2.  Ulceration, left foot 3.  Type 2 diabetes mellitus with peripheral neuropathy  POSTOPERATIVE DIAGNOSIS Same  PROCEDURE 1.  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  Debridement of ulceration, left foot  ANESTHESIA Monitor Anesthesia Care   HEMOSTASIS Pneumatic ankle tourniquet set at 250 mmHg  ESTIMATED BLOOD LOSS Minimal (<5 cc)  MATERIALS USED None  INJECTABLES 0.5% Marcaine plain  PATHOLOGY 1.  Left hallux and left 1st metatarsal head 2.  Wafer of 1st metatarsal bone for margins  COMPLICATIONS None  INDICATIONS:  Chronic ulceration of the left foot with MRI findings consistent with osteomyelitis.  DESCRIPTION OF THE PROCEDURE:  The patient was brought to the operating room and placed on the operative table in the supine position.  A pneumatic ankle tourniquet was applied to the operative extremity.  Following sedation, the surgical site was anesthetized with 0.5% Marcaine plain.  The foot was then prepped, scrubbed, and draped in the usual sterile technique.  The foot was elevated, exsanguinated and the pneumatic ankle tourniquet inflated to 250 mmHg.    Attention was directed to the left foot.  2 converging semielliptical incisions were made encompassing the left hallux circumferentially.  Dissection was continued deep down to the level of the first metatarsophalangeal joint.  The joint was disarticulated and the left hallux passed from the operative field.  The incision was continued over the dorsal aspect of the first metatarsal.  Dissection was continued deep down to the level of the first metatarsal bone.  A linear periosteal incision was performed.  The periosteum was  reflected medially and laterally exposing the first metatarsal at the operative site.  Using a power bone saw a through and through osteotomy was performed just proximal to the surgical neck of the first metatarsal.  The head of the first metatarsal was freed of all soft tissue attachments and passed from the operative field.  The left hallux and the first metatarsal head were submitted together to pathology.  A second through and through osteotomy was performed just proximal to the original osteotomy creating a small wafer of bone.  This bone was freed of all soft tissue attachments and passed from the operative field.  It was submitted separately to pathology for margins.  The flexor hallucis longus tendon was identified and transected at the proximal aspect of the surgical field.  The surgical wound was flushed with copious amounts of sterile irrigant.  The subcutaneous structures were reapproximated using 4-0 Vicryl.  The skin was reapproximated using 3-0 nylon.  A sterile compressive dressing was applied to the operative foot.  The pneumatic ankle tourniquet was deflated and a prompt hyperemic response was noted to all remaining digits of the operative foot.   The patient tolerated the procedure well.  The patient was then transferred to PACU with vital signs stable and vascular status intact to all toes of the operative foot.

## 2018-10-26 NOTE — Brief Op Note (Signed)
BRIEF OPERATIVE NOTE  DATE OF PROCEDURE 10/26/2018  SURGEON Marcheta Grammes, DPM  ASSISTANT SURGEON None  OR STAFF Circulator: Brim, Fransisco Hertz, RN Scrub Person: Lucie Leather, CST Circulator Assistant: Glory Rosebush, RN   PREOPERATIVE DIAGNOSIS 1.  Osteomyelitis of hallux, left foot 2.  Ulceration, left foot 3.  Type 2 diabetes mellitus with peripheral neuropathy  POSTOPERATIVE DIAGNOSIS Same  PROCEDURE 1.  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  Debridement of ulceration, left foot  ANESTHESIA Monitor Anesthesia Care   HEMOSTASIS Pneumatic ankle tourniquet set at 250 mmHg  ESTIMATED BLOOD LOSS Minimal (<5 cc)  MATERIALS USED None  INJECTABLES 0.5% Marcaine plain  PATHOLOGY 1.  Left hallux and left 1st metatarsal head 2.  Wafer of 1st metatarsal bone for margins  COMPLICATIONS None

## 2018-10-26 NOTE — H&P (Signed)
HISTORY AND PHYSICAL INTERVAL NOTE:  10/26/2018  7:24 AM  Patrick [name]  has presented today for surgery, with the diagnosis of osteomyelitis great toe left, ulcer left foot.  The various methods of treatment have been discussed with the patient.  No guarantees were given.  After consideration of risks, benefits and other options for treatment, the patient has consented to surgery.  I have reviewed the patients' chart and labs.    Patient Vitals for the past 24 hrs:  BP Temp Temp src Pulse Resp SpO2  10/26/18 0653 (!) 142/99 98.2 F (36.8 C) Oral 84 18 97 %    A history and physical examination was performed in my office.  The patient was reexamined.  There have been no changes to this history and physical examination.  Marcheta Grammes, DPM

## 2018-10-27 ENCOUNTER — Encounter (HOSPITAL_COMMUNITY): Payer: Self-pay | Admitting: Podiatry

## 2018-10-31 LAB — SURGICAL PATHOLOGY

## 2018-12-20 ENCOUNTER — Encounter: Payer: Self-pay | Admitting: Infectious Disease

## 2018-12-20 ENCOUNTER — Ambulatory Visit: Payer: BC Managed Care – PPO | Admitting: Infectious Disease

## 2018-12-20 ENCOUNTER — Ambulatory Visit
Admission: RE | Admit: 2018-12-20 | Discharge: 2018-12-20 | Disposition: A | Discharge status: Home / Self Care | Payer: BC Managed Care – PPO | Source: Ambulatory Visit | Attending: Infectious Disease | Admitting: Infectious Disease

## 2018-12-20 ENCOUNTER — Other Ambulatory Visit: Payer: Self-pay

## 2018-12-20 DIAGNOSIS — E1149 Type 2 diabetes mellitus with other diabetic neurological complication: Secondary | ICD-10-CM

## 2018-12-20 DIAGNOSIS — E1140 Type 2 diabetes mellitus with diabetic neuropathy, unspecified: Secondary | ICD-10-CM

## 2018-12-20 DIAGNOSIS — M86671 Other chronic osteomyelitis, right ankle and foot: Secondary | ICD-10-CM

## 2018-12-20 DIAGNOSIS — M86672 Other chronic osteomyelitis, left ankle and foot: Secondary | ICD-10-CM

## 2018-12-20 DIAGNOSIS — A4901 Methicillin susceptible Staphylococcus aureus infection, unspecified site: Secondary | ICD-10-CM

## 2018-12-20 DIAGNOSIS — L089 Local infection of the skin and subcutaneous tissue, unspecified: Secondary | ICD-10-CM

## 2018-12-20 DIAGNOSIS — E11628 Type 2 diabetes mellitus with other skin complications: Secondary | ICD-10-CM | POA: Diagnosis not present

## 2018-12-20 HISTORY — DX: Local infection of the skin and subcutaneous tissue, unspecified: L08.9

## 2018-12-20 HISTORY — DX: Methicillin susceptible Staphylococcus aureus infection, unspecified site: A49.01

## 2018-12-20 HISTORY — DX: Other chronic osteomyelitis, left ankle and foot: M86.672

## 2018-12-20 HISTORY — DX: Other chronic osteomyelitis, right ankle and foot: M86.671

## 2018-12-20 HISTORY — DX: Type 2 diabetes mellitus with diabetic neuropathy, unspecified: E11.40

## 2018-12-20 HISTORY — DX: Morbid (severe) obesity due to excess calories: E66.01

## 2018-12-20 HISTORY — DX: Type 2 diabetes mellitus with other skin complications: E11.628

## 2018-12-20 NOTE — Progress Notes (Signed)
Subjective:   Reason for infectious disease consult diabetic foot infection with osteomyelitis  Requesting Physician: Dr. Caprice Beaver   Patient ID: Patrick [name], male    DOB: 09-04-1970, 48 y.o.   MRN: [id number]  HPI  Patrick [name] is a 48 year old Caucasian male with a past medical history significant for morbid obesity and diabetes mellitus with neuropathy and diabetic foot infections.  In talking to the patient he tells me that he used to weigh over 400 pounds and had much more out-of-control diabetes mellitus.  Now he is down into the 240s through diet.  He is on oral agents and no insulin and most recent A1c was 6.7.  Unfortunately he had had problems with bilateral foot wounds that developed and he was seen by Dr. Caprice Beaver in March 2020.  He developed a right foot wound after he fell asleep with a heating pad on his foot a year prior to being seen.  By February 2020 he had also developed wounds along the dorsal lateral aspect of his right foot where he had 3 bone fragments consistent with sequestra that surfaced plain films on his right showed evidence consistent with osteomyelitis an MRI of the right foot was performed.  He underwent 1/5 ray amputation of the right foot and debridement of the ulceration of his left foot on May 10, 2018 he fairly did relatively well postoperatively though developed areas of superficial localized areas of fluctuance.  An I&D was performed the office and treated with antibiotics.  His left wound and developed originally December 2019 after he been a trip to [facility].  He had done well with offloading and local wound care until late September early October.  On October 2 he developed exposed flexor houses longus tendon mottling of the left helix.  Radiographs showed findings consistent with osteomyelitis MRI also showed osteomyelitis the proximal distal phalanges of the great toe.  He underwent a first ray amputation on October 26, 2018.  Pathology showed  osteomyelitis on 1 piece of tissue but the other piece of tissue presumably at the margin showed remodeling of bone and no evidence of osteomyelitis.  Do not see that cultures were sent.  I do see cultures that were obtained from October 13, 2018 which revealed methicillin sensitive Staph aureus that was sensitive to all antibiotics tested except for conventional tetracycline with sensitivity to doxycycline as well.    The patient apparently had been doing well but then presented to clinic on 6 November with increased edema and erythema of his left foot plain films were again performed which showed evidence of residual first metatarsal osteomyelitis and distal second metatarsal osteomyelitis.  Labs done at PCP office showed no evidence of elevation in inflammatory markers.  He has been referred to [place] by Dr. Caprice Beaver.  The patient is in the impression that he does not have osteomyelitis and that he is only being seen by [place] "just in case he has a bone infection.  The interim he has been on clindamycin and ciprofloxacin and had improvement in the erythema there.  He is not with fevers or systemic symptoms.  I am concerned that plain films are showing osteomyelitis not just at the site of amputation but at an adjacent site.  I explained to him that microbes are microscopic and certainly can invade adjacent tissues and not show clear-cut evidence of this on pathology or x-rays.  I am going to get an x-ray today and if this is relatively stable get an MRI as well.  Certainly if he has osteomyelitis in these areas he will need further surgery and likely  more proximal amputation.  If he has further surgery I would recommend sending a dedicated piece of bone, tissue in a sterile container with saline and no fixative is and send this not to pathology but to the microbiology lab so we can send a culture off and provide targeted antimicrobial therapy.  I would likely give him IV antibiotics at minimum  as a "mop up " therapy.  Certainly at this point time there is no urgency to initiating antibiotics.   Past Medical History:  Diagnosis Date  . Anxiety   . Arthritis   . Chronic osteomyelitis of left foot ([place]) 12/20/2018  . Chronic osteomyelitis of right foot ([place]) 12/20/2018  . Diabetes mellitus without complication ([place])   . Diabetic foot infection ([place]) 12/20/2018  . History of kidney stones   . PONV (postoperative nausea and vomiting)     Past Surgical History:  Procedure Laterality Date  . AMPUTATION Right 05/10/2018   Procedure: PARTIAL 5TH RAY AMPUTATION RIGHT FOOT;  Surgeon: Caprice Beaver, DPM;  Location: [facility];  Service: Podiatry;  Laterality: Right;  . AMPUTATION Left 10/26/2018   Procedure: PARTIAL 1ST RAY AMPUTATION FOOT;  Surgeon: Caprice Beaver, DPM;  Location: [facility];  Service: Podiatry;  Laterality: Left;  . APPLICATION OF WOUND VAC Right 05/10/2018   Procedure: APPLICATION OF WOUND VAC RIGHT FOOT;  Surgeon: Caprice Beaver, DPM;  Location: [facility];  Service: Podiatry;  Laterality: Right;  Marland Kitchen VASECTOMY    . WOUND DEBRIDEMENT Right 05/10/2018   Procedure: DEBRIDEMENT PUNCTURE WOUND;  Surgeon: Caprice Beaver, DPM;  Location: [facility];  Service: Podiatry;  Laterality: Right;  . WOUND DEBRIDEMENT Left 05/10/2018   Procedure: DEBRIDEMENT ULCERATION LEFT FOOT;  Surgeon: Caprice Beaver, DPM;  Location: [facility];  Service: Podiatry;  Laterality: Left;  . WOUND DEBRIDEMENT Left 10/26/2018   Procedure: DEBRIDEMENT ULCER LEFT FOOT;  Surgeon: Caprice Beaver, DPM;  Location: [facility];  Service: Podiatry;  Laterality: Left;    No family history on file.    Social History   Socioeconomic History  . Marital status: Married    Spouse name: Not on file  . Number of children: Not on file  . Years of education: Not on file  . Highest education level: Not on file  Occupational History  . Not on file  Social Needs  . Financial resource strain: Not on file  . Food  insecurity    Worry: Not on file    Inability: Not on file  . Transportation needs    Medical: Not on file    Non-medical: Not on file  Tobacco Use  . Smoking status: Never Smoker  . Smokeless tobacco: Former Systems developer    Types: Chew  Substance and Sexual Activity  . Alcohol use: Not Currently  . Drug use: Never  . Sexual activity: Yes  Lifestyle  . Physical activity    Days per week: Not on file    Minutes per session: Not on file  . Stress: Not on file  Relationships  . Social Herbalist on phone: Not on file    Gets together: Not on file    Attends religious service: Not on file    Active member of club or organization: Not on file    Attends meetings of clubs or organizations: Not on file    Relationship status: Not on file  Other Topics Concern  .  Not on file  Social History Narrative  . Not on file    No Known Allergies   Current Outpatient Medications:  .  atorvastatin (LIPITOR) 20 MG tablet, Take 20 mg by mouth every evening., Disp: , Rfl:  .  glipiZIDE (GLUCOTROL XL) 5 MG 24 hr tablet, Take 5 mg by mouth daily., Disp: , Rfl:  .  JARDIANCE 25 MG TABS tablet, Take 25 mg by mouth every morning., Disp: , Rfl:  .  metFORMIN (GLUCOPHAGE-XR) 500 MG 24 hr tablet, Take 500 mg by mouth 2 (two) times daily., Disp: , Rfl:  .  Multiple Vitamins-Minerals (MULTIVITAMIN WITH MINERALS) tablet, Take 1 tablet by mouth daily., Disp: , Rfl:  .  Polyethyl Glycol-Propyl Glycol (SYSTANE OP), Place 1 drop into both eyes daily as needed (dry eyes)., Disp: , Rfl:  .  sertraline (ZOLOFT) 100 MG tablet, Take 100 mg by mouth every morning., Disp: , Rfl:     Review of Systems  Constitutional: Negative for activity change, appetite change, chills, diaphoresis, fatigue, fever and unexpected weight change.  HENT: Negative for congestion, rhinorrhea, sinus pressure, sneezing, sore throat and trouble swallowing.   Eyes: Negative for photophobia and visual disturbance.  Respiratory:  Negative for cough, chest tightness, shortness of breath, wheezing and stridor.   Cardiovascular: Negative for chest pain, palpitations and leg swelling.  Gastrointestinal: Negative for abdominal distention, abdominal pain, anal bleeding, blood in stool, constipation, diarrhea, nausea and vomiting.  Genitourinary: Negative for difficulty urinating, dysuria, flank pain and hematuria.  Musculoskeletal: Negative for arthralgias, back pain, gait problem, joint swelling and myalgias.  Skin: Negative for color change, pallor, rash and wound.  Neurological: Negative for dizziness, tremors, weakness and light-headedness.  Hematological: Negative for adenopathy. Does not bruise/bleed easily.  Psychiatric/Behavioral: Negative for agitation, behavioral problems, confusion, decreased concentration, dysphoric mood and sleep disturbance.       Objective:   Physical Exam Constitutional:      General: He is not in acute distress.    Appearance: Normal appearance. He is well-developed. He is not ill-appearing or diaphoretic.  HENT:     Head: Normocephalic and atraumatic.     Right Ear: Hearing and external ear normal.     Left Ear: Hearing and external ear normal.     Nose: No nasal deformity or rhinorrhea.  Eyes:     General: No scleral icterus.    Extraocular Movements: Extraocular movements intact.     Conjunctiva/sclera: Conjunctivae normal.     Right eye: Right conjunctiva is not injected.     Left eye: Left conjunctiva is not injected.  Neck:     Musculoskeletal: Normal range of motion and neck supple.     Vascular: No JVD.  Cardiovascular:     Rate and Rhythm: Normal rate and regular rhythm.     Heart sounds: S1 normal and S2 normal. Murmur present.  Pulmonary:     Effort: Pulmonary effort is normal. No respiratory distress.     Breath sounds: No wheezing.  Abdominal:     General: Bowel sounds are normal. There is no distension.     Palpations: Abdomen is soft.  Musculoskeletal: Normal  range of motion.        General: Deformity present.     Right shoulder: Normal.     Left shoulder: Normal.     Right hip: Normal.     Left hip: Normal.     Right knee: Normal.     Left knee: Normal.  Lymphadenopathy:  Head:     Right side of head: No submandibular, preauricular or posterior auricular adenopathy.     Left side of head: No submandibular, preauricular or posterior auricular adenopathy.     Cervical: No cervical adenopathy.     Right cervical: No superficial or deep cervical adenopathy.    Left cervical: No superficial or deep cervical adenopathy.  Skin:    General: Skin is warm and dry.     Coloration: Skin is not pale.     Findings: No abrasion, bruising, ecchymosis, erythema, lesion or rash.     Nails: There is no clubbing.   Neurological:     General: No focal deficit present.     Mental Status: He is alert and oriented to person, place, and time.     Sensory: No sensory deficit.     Coordination: Coordination normal.     Gait: Gait normal.  Psychiatric:        Attention and Perception: He is attentive.        Mood and Affect: Mood normal.        Speech: Speech normal.        Behavior: Behavior normal. Behavior is cooperative.        Thought Content: Thought content normal.        Judgment: Judgment normal.    Right foot: No ulcerations or overt evidence externally of bone infection.  Left foot with edema around wound, and abnormality of second digit: 12/20/2018:           Assessment & Plan:   Left foot osteomyelitis with involvement of the first metatarsal and then also second metatarsal:  I will get plain films today in the they have been performed but not yet read.  If plain films are stable as I mentioned I will get an MRI of the left foot.  I think he will need more proximal amputation and if so it would be helpful to have dedicated tissue bone sent in sterile container without any fixative is that would interfere with culture and have  this sent not to pathology but to the microbiology lab so we can provide targeted antimicrobial therapy  History of right foot osteomyelitis: I am concerned about the area in his heel if that was involved with a bone infection as well although is not completely clear to me through the notes.  Diabetes mellitus: He is currently on oral therapy and is on a great job of getting his A1c down.  Morbid obesity: He is done a remarkable job of dropping his weight from over 400 pounds to 240 pounds.  I have given him praise regarding this  Need for flu vaccination: I counseled him strongly to get a flu vaccine but he refused stating that he became very ill with a flu vaccine.  I asked if he was hospitalized for that reaction to the flu vaccine and he denied this.

## 2018-12-27 ENCOUNTER — Telehealth: Payer: Self-pay

## 2018-12-27 NOTE — Telephone Encounter (Signed)
Dr. Caprice Beaver is trying to explain to the patient why we both believe he has a bone infection.

## 2018-12-27 NOTE — Telephone Encounter (Signed)
Patient called to see why Dr. Arvil Persons appointment was moved up to this Friday. Patient was not informed about this decision during appointment, and would like to know why appointment was rescheduled to an earlier date. Patient has contacted [facility], but was not told why appointment was moved. Will forward message to MD Aundria Rud, [place]

## 2018-12-27 NOTE — Telephone Encounter (Signed)
Received a call today from [place] requesting office note from MDs office. Faxed note to [phone]. [place]

## 2019-01-01 ENCOUNTER — Other Ambulatory Visit: Payer: Self-pay | Admitting: *Deleted

## 2019-01-01 ENCOUNTER — Other Ambulatory Visit: Payer: Self-pay

## 2019-01-01 ENCOUNTER — Ambulatory Visit: Payer: BC Managed Care – PPO | Admitting: Infectious Disease

## 2019-01-01 ENCOUNTER — Encounter: Payer: Self-pay | Admitting: Infectious Disease

## 2019-01-01 VITALS — BP 148/95 | HR 95 | Temp 97.9°F | Ht 72.0 in | Wt 250.0 lb

## 2019-01-01 DIAGNOSIS — E1149 Type 2 diabetes mellitus with other diabetic neurological complication: Secondary | ICD-10-CM

## 2019-01-01 DIAGNOSIS — M86672 Other chronic osteomyelitis, left ankle and foot: Secondary | ICD-10-CM

## 2019-01-01 DIAGNOSIS — M86671 Other chronic osteomyelitis, right ankle and foot: Secondary | ICD-10-CM

## 2019-01-01 DIAGNOSIS — A4901 Methicillin susceptible Staphylococcus aureus infection, unspecified site: Secondary | ICD-10-CM

## 2019-01-01 NOTE — Progress Notes (Signed)
Subjective:   Chief complaint:   Patient ID: Patrick [name], male    DOB: [birth year], 48 y.o.   MRN: [id number]  HPI  Patrick [name] is a 48 year old Caucasian male with a past medical history significant for morbid obesity and diabetes mellitus with neuropathy and diabetic foot infections.  In talking to the patient at the last visit he told me me that he used to weigh over 400 pounds and had much more out-of-control diabetes mellitus.  Now he is down into the 240s through diet.  He is on oral agents and no insulin and most recent A1c was 6.7.  Unfortunately he had had problems with bilateral foot wounds that developed and he was seen by Dr. Caprice Beaver in March 2020.  He developed a right foot wound after he fell asleep with a heating pad on his foot a year prior to being seen.  By February 2020 he had also developed wounds along the dorsal lateral aspect of his right foot where he had 3 bone fragments consistent with sequestra that surfaced plain films on his right showed evidence consistent with osteomyelitis an MRI of the right foot was performed.  He underwent 1/5 ray amputation of the right foot and debridement of the ulceration of his left foot on May 10, 2018 he fairly did relatively well postoperatively though developed areas of superficial localized areas of fluctuance.  An I&D was performed the office and treated with antibiotics.  His left wound and developed originally December 2019 after he been a trip to [facility].  He had done well with offloading and local wound care until late September early October.  On October 2 he developed exposed flexor houses longus tendon mottling of the left helix.  Radiographs showed findings consistent with osteomyelitis MRI also showed osteomyelitis the proximal distal phalanges of the great toe.  He underwent a first ray amputation on October 26, 2018.  Pathology showed osteomyelitis on 1 piece of tissue but the other piece of tissue presumably at the margin  showed remodeling of bone and no evidence of osteomyelitis.  I did not see that cultures were sent.  I do see cultures that were obtained from October 13, 2018 which revealed methicillin sensitive Staph aureus that was sensitive to all antibiotics tested except for conventional tetracycline with sensitivity to doxycycline as well.    The patient apparently had been doing well but then presented to clinic on 6 November with increased edema and erythema of his left foot plain films were again performed which showed evidence of residual first metatarsal osteomyelitis and distal second metatarsal osteomyelitis.  Labs done at PCP office showed no evidence of elevation in inflammatory markers.  He had been referred to [place] by Dr. Caprice Beaver.  The patient was under the impression that he does not have osteomyelitis and that he was only being seen by [place] "just in case he has a bone infection.  The interim he has been on clindamycin and ciprofloxacin and had improvement in the erythema there.  He did not with fevers or systemic symptoms.  I was  concerned that plain films showed osteomyelitis not just at the site of amputation but at an adjacent site.  I explained to him at last visit that microbes are microscopic and certainly can invade adjacent tissues and not show clear-cut evidence of this on pathology or x-rays.   I did obtain plain films here and I agree do show osteomyelitis.  The official read was:   Post first tarsal  metatarsal amputation at the mid shaft of the first metatarsal now with extensive periosteal reaction in signs of bony destruction about the first metatarsal. Similarly there is exuberant periosteal reaction about the distal half of the second and third metatarsals with bony resorption around the head of the second metatarsal and with periosteal reaction about the proximal phalanx of the second toe.  I touch base with Dr. Caprice Beaver he saw the patient on Friday.  Patient  tells me that he is going to get an MRI of the foot.  Patient seems either to not genuinely believe he has a bone infection or perhaps not feel that he needs surgery to achieve cure of it.  My experience with these types of infections they are never cured absent surgery and antibiotics.  Antibiotics alone concern will be given for suppressive efforts I am not opposed to that  He has been off antibiotics since I had him stop his clindamycin and ciprofloxacin   Past Medical History:  Diagnosis Date  . Anxiety   . Arthritis   . Chronic osteomyelitis of left foot ([place]) 12/20/2018  . Chronic osteomyelitis of right foot ([place]) 12/20/2018  . Diabetes mellitus without complication ([place])   . Diabetic foot infection ([place]) 12/20/2018  . Diabetic neuropathy ([place]) 12/20/2018  . History of kidney stones   . Morbid obesity ([place]) 12/20/2018  . MSSA (methicillin susceptible Staphylococcus aureus) infection 12/20/2018  . PONV (postoperative nausea and vomiting)     Past Surgical History:  Procedure Laterality Date  . AMPUTATION Right 05/10/2018   Procedure: PARTIAL 5TH RAY AMPUTATION RIGHT FOOT;  Surgeon: Caprice Beaver, DPM;  Location: [facility];  Service: Podiatry;  Laterality: Right;  . AMPUTATION Left 10/26/2018   Procedure: PARTIAL 1ST RAY AMPUTATION FOOT;  Surgeon: Caprice Beaver, DPM;  Location: [facility];  Service: Podiatry;  Laterality: Left;  . APPLICATION OF WOUND VAC Right 05/10/2018   Procedure: APPLICATION OF WOUND VAC RIGHT FOOT;  Surgeon: Caprice Beaver, DPM;  Location: [facility];  Service: Podiatry;  Laterality: Right;  Marland Kitchen VASECTOMY    . WOUND DEBRIDEMENT Right 05/10/2018   Procedure: DEBRIDEMENT PUNCTURE WOUND;  Surgeon: Caprice Beaver, DPM;  Location: [facility];  Service: Podiatry;  Laterality: Right;  . WOUND DEBRIDEMENT Left 05/10/2018   Procedure: DEBRIDEMENT ULCERATION LEFT FOOT;  Surgeon: Caprice Beaver, DPM;  Location: [facility];  Service: Podiatry;  Laterality: Left;  . WOUND  DEBRIDEMENT Left 10/26/2018   Procedure: DEBRIDEMENT ULCER LEFT FOOT;  Surgeon: Caprice Beaver, DPM;  Location: [facility];  Service: Podiatry;  Laterality: Left;    No family history on file.    Social History   Socioeconomic History  . Marital status: Married    Spouse name: Not on file  . Number of children: Not on file  . Years of education: Not on file  . Highest education level: Not on file  Occupational History  . Not on file  Tobacco Use  . Smoking status: Never Smoker  . Smokeless tobacco: Former Systems developer    Types: Chew  Substance and Sexual Activity  . Alcohol use: Not Currently  . Drug use: Never  . Sexual activity: Yes  Other Topics Concern  . Not on file  Social History Narrative  . Not on file   Social Determinants of Health   Financial Resource Strain:   . Difficulty of Paying Living Expenses: Not on file  Food Insecurity:   . Worried About Charity fundraiser in the Last Year: Not on file  .  Ran Out of Food in the Last Year: Not on file  Transportation Needs:   . Lack of Transportation (Medical): Not on file  . Lack of Transportation (Non-Medical): Not on file  Physical Activity:   . Days of Exercise per Week: Not on file  . Minutes of Exercise per Session: Not on file  Stress:   . Feeling of Stress : Not on file  Social Connections:   . Frequency of Communication with Friends and Family: Not on file  . Frequency of Social Gatherings with Friends and Family: Not on file  . Attends Religious Services: Not on file  . Active Member of Clubs or Organizations: Not on file  . Attends Archivist Meetings: Not on file  . Marital Status: Not on file    No Known Allergies   Current Outpatient Medications:  .  atorvastatin (LIPITOR) 20 MG tablet, Take 20 mg by mouth every evening., Disp: , Rfl:  .  glipiZIDE (GLUCOTROL XL) 5 MG 24 hr tablet, Take 5 mg by mouth daily., Disp: , Rfl:  .  HYDROcodone-acetaminophen (NORCO) 7.5-325 MG tablet, Take 1  tablet by mouth every 4 (four) hours as needed., Disp: , Rfl:  .  JARDIANCE 25 MG TABS tablet, Take 25 mg by mouth every morning., Disp: , Rfl:  .  metFORMIN (GLUCOPHAGE-XR) 500 MG 24 hr tablet, Take 500 mg by mouth 2 (two) times daily., Disp: , Rfl:  .  Multiple Vitamins-Minerals (MULTIVITAMIN WITH MINERALS) tablet, Take 1 tablet by mouth daily., Disp: , Rfl:  .  Polyethyl Glycol-Propyl Glycol (SYSTANE OP), Place 1 drop into both eyes daily as needed (dry eyes)., Disp: , Rfl:  .  sertraline (ZOLOFT) 100 MG tablet, Take 100 mg by mouth every morning., Disp: , Rfl:     Review of Systems  Constitutional: Negative for activity change, appetite change, chills, diaphoresis, fatigue, fever and unexpected weight change.  HENT: Negative for congestion, rhinorrhea, sinus pressure, sneezing, sore throat and trouble swallowing.   Eyes: Negative for photophobia and visual disturbance.  Respiratory: Negative for cough, chest tightness, shortness of breath, wheezing and stridor.   Cardiovascular: Negative for chest pain, palpitations and leg swelling.  Gastrointestinal: Negative for abdominal distention, abdominal pain, anal bleeding, blood in stool, constipation, diarrhea, nausea and vomiting.  Genitourinary: Negative for difficulty urinating, dysuria, flank pain and hematuria.  Musculoskeletal: Negative for arthralgias, back pain, gait problem, joint swelling and myalgias.  Skin: Negative for color change, pallor, rash and wound.  Neurological: Negative for dizziness, tremors, weakness and light-headedness.  Hematological: Negative for adenopathy. Does not bruise/bleed easily.  Psychiatric/Behavioral: Negative for agitation, behavioral problems, confusion, decreased concentration, dysphoric mood and sleep disturbance.       Objective:   Physical Exam Constitutional:      General: He is not in acute distress.    Appearance: Normal appearance. He is well-developed. He is not ill-appearing or  diaphoretic.  HENT:     Head: Normocephalic and atraumatic.     Right Ear: Hearing and external ear normal.     Left Ear: Hearing and external ear normal.     Nose: No nasal deformity or rhinorrhea.  Eyes:     General: No scleral icterus.    Extraocular Movements: Extraocular movements intact.     Conjunctiva/sclera: Conjunctivae normal.     Right eye: Right conjunctiva is not injected.     Left eye: Left conjunctiva is not injected.  Neck:     Vascular: No JVD.  Cardiovascular:  Rate and Rhythm: Normal rate and regular rhythm.     Heart sounds: S1 normal and S2 normal. Murmur present.  Pulmonary:     Effort: Pulmonary effort is normal. No respiratory distress.     Breath sounds: No wheezing.  Abdominal:     General: Bowel sounds are normal. There is no distension.     Palpations: Abdomen is soft.  Musculoskeletal:        General: Deformity present. Normal range of motion.     Right shoulder: Normal.     Left shoulder: Normal.     Cervical back: Normal range of motion and neck supple.     Right hip: Normal.     Left hip: Normal.     Right knee: Normal.     Left knee: Normal.  Lymphadenopathy:     Head:     Right side of head: No submandibular, preauricular or posterior auricular adenopathy.     Left side of head: No submandibular, preauricular or posterior auricular adenopathy.     Cervical: No cervical adenopathy.     Right cervical: No superficial or deep cervical adenopathy.    Left cervical: No superficial or deep cervical adenopathy.  Skin:    General: Skin is warm and dry.     Coloration: Skin is not pale.     Findings: No abrasion, bruising, ecchymosis, erythema, lesion or rash.     Nails: There is no clubbing.  Neurological:     General: No focal deficit present.     Mental Status: He is alert and oriented to person, place, and time.     Sensory: No sensory deficit.     Coordination: Coordination normal.     Gait: Gait normal.  Psychiatric:         Attention and Perception: He is attentive.        Mood and Affect: Mood normal.        Speech: Speech normal.        Behavior: Behavior normal. Behavior is cooperative.        Thought Content: Thought content normal.        Judgment: Judgment normal.    Right foot: No ulcerations or overt evidence externally of bone infection.  Left foot with edema around wound, and abnormality of second digit: 12/20/2018:     Left foot today July 02, 2018          Assessment & Plan:   Left foot osteomyelitis with involvement of the first metatarsal and then also second metatarsal:   MRI of the foot is going to be performed  He also describes to me that sound like a tagged white blood cell scan.  Usually MRIs are sufficient but I am not totally opposed to such a scan being done.  As mentioned above I think he is going to need more proximal amputation given these 3 sites of infection in his left foot  History of right foot osteomyelitis: I am concerned about the area in his heel if that was involved with a bone infection as well although is not completely clear to me through the notes.  Diabetes mellitus: He is currently on oral therapy and is on a great job of getting his A1c down.  Morbid obesity: He is done a remarkable job of dropping his weight from over 400 pounds to 240 pounds.    Need for flu vaccination: Our nursing staff and I again counseled on the need for flu vaccine but he again adamantly  refused

## 2019-01-04 ENCOUNTER — Other Ambulatory Visit: Payer: Self-pay | Admitting: Podiatry

## 2019-01-04 DIAGNOSIS — M869 Osteomyelitis, unspecified: Secondary | ICD-10-CM

## 2019-01-04 NOTE — Progress Notes (Signed)
Waiting on MRI.  His wound continues to improve.  Do you think this could be forefoot charcot process or even osteo superimposed on charcot process?  What are your thoughts on a labeled WBC scan?  I rarely get them as MRI is usually sufficient.

## 2019-01-16 ENCOUNTER — Other Ambulatory Visit: Payer: Self-pay

## 2019-01-16 ENCOUNTER — Ambulatory Visit (HOSPITAL_COMMUNITY)
Admission: RE | Admit: 2019-01-16 | Discharge: 2019-01-16 | Disposition: A | Discharge status: Home / Self Care | Payer: BC Managed Care – PPO | Source: Ambulatory Visit | Attending: Podiatry | Admitting: Podiatry

## 2019-01-16 DIAGNOSIS — M869 Osteomyelitis, unspecified: Secondary | ICD-10-CM | POA: Insufficient documentation

## 2019-01-16 LAB — POCT I-STAT CREATININE: Creatinine, Ser: 1 mg/dL (ref 0.61–1.24)

## 2019-01-16 MED ORDER — GADOBUTROL 1 MMOL/ML IV SOLN
10.0000 mL | Freq: Once | INTRAVENOUS | Status: AC | PRN
  Administered 2019-01-16: 10 mL via INTRAVENOUS

## 2019-02-06 ENCOUNTER — Ambulatory Visit: Payer: BC Managed Care – PPO | Admitting: Infectious Disease

## 2020-01-19 DIAGNOSIS — Z951 Presence of aortocoronary bypass graft: Secondary | ICD-10-CM

## 2020-01-19 HISTORY — DX: Presence of aortocoronary bypass graft: Z95.1

## 2020-04-01 HISTORY — PX: CORONARY ARTERY BYPASS GRAFT: SHX141

## 2020-04-25 IMAGING — DX DG FOOT COMPLETE 3+V*L*
3 series · 3 of 3 positions shown · non-contrast
Comparison: 10/26/2018

CLINICAL DATA: Osteomyelitis, diabetic foot infection

EXAM:
LEFT FOOT - COMPLETE 3+ VIEW

[dg foot complete left (1 of 3)]
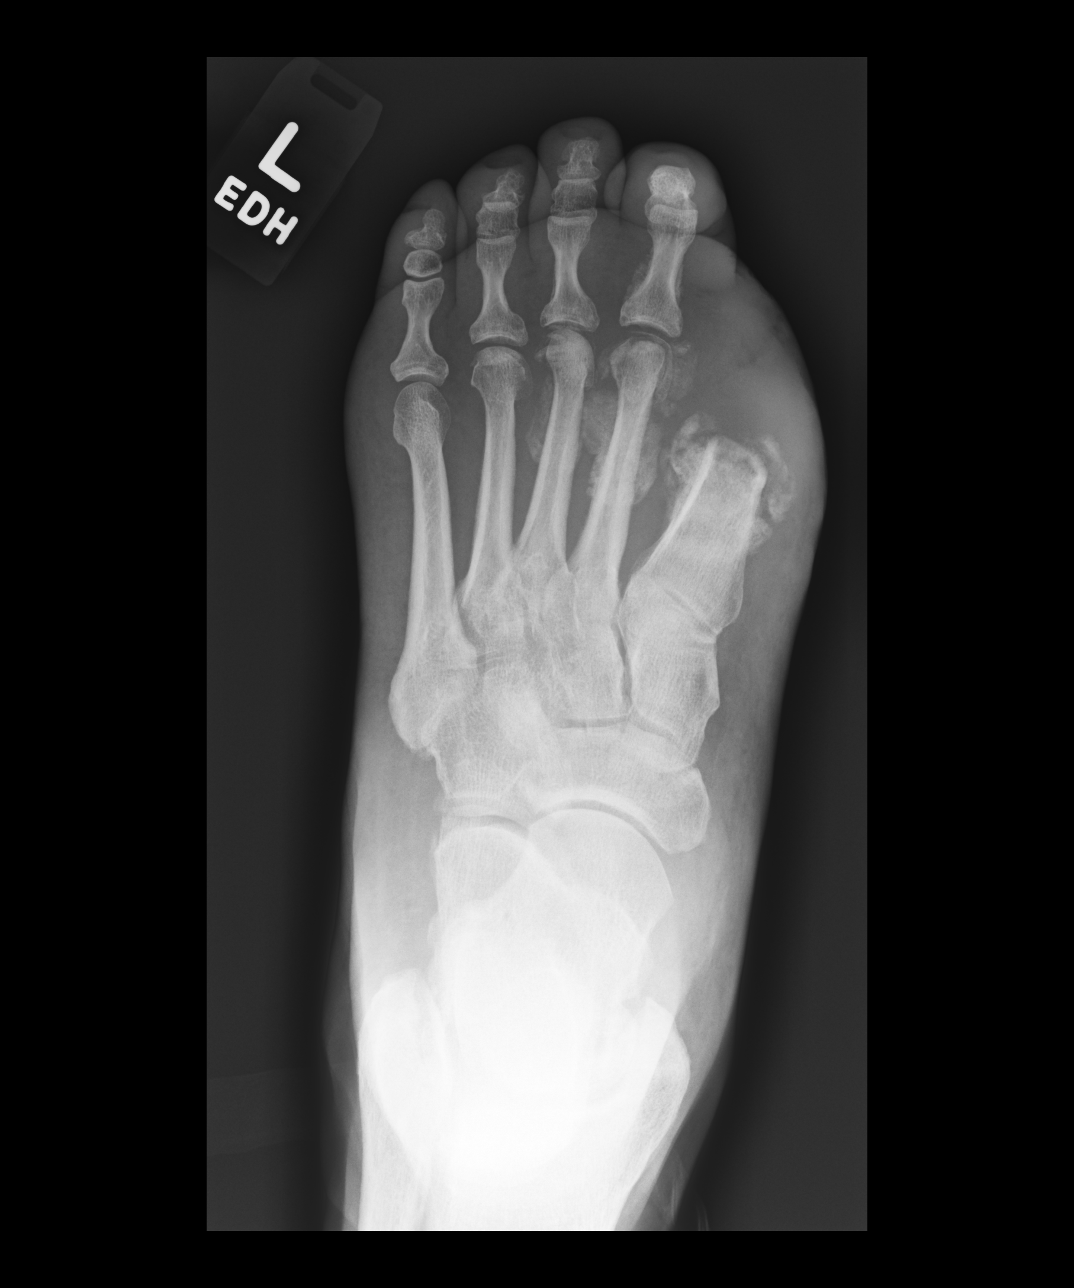

[dg foot complete left (2 of 3)]
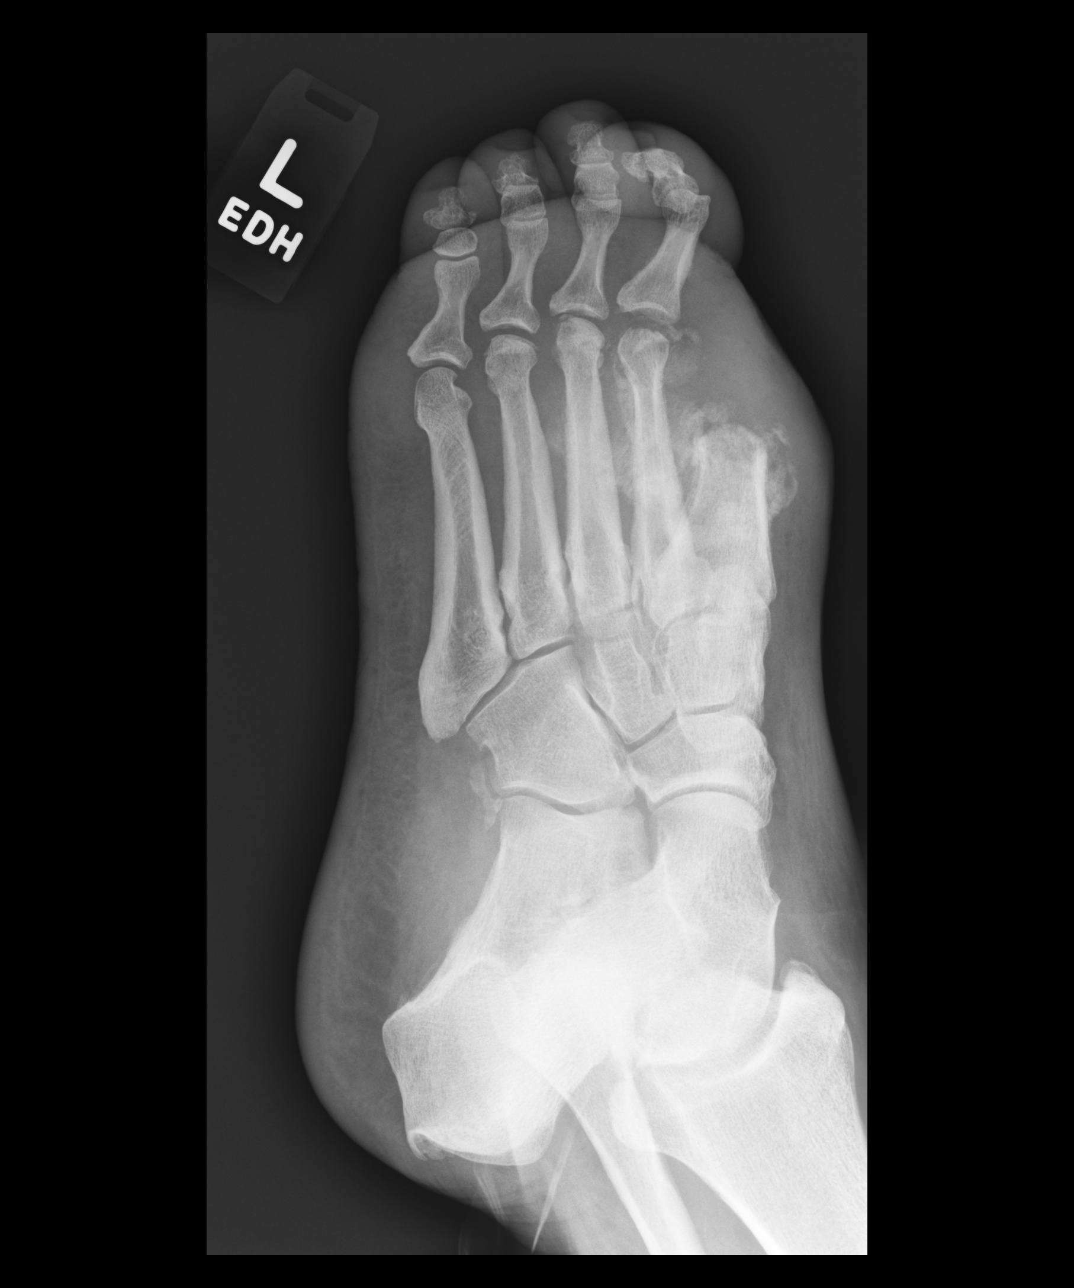

[dg foot complete left (3 of 3)]
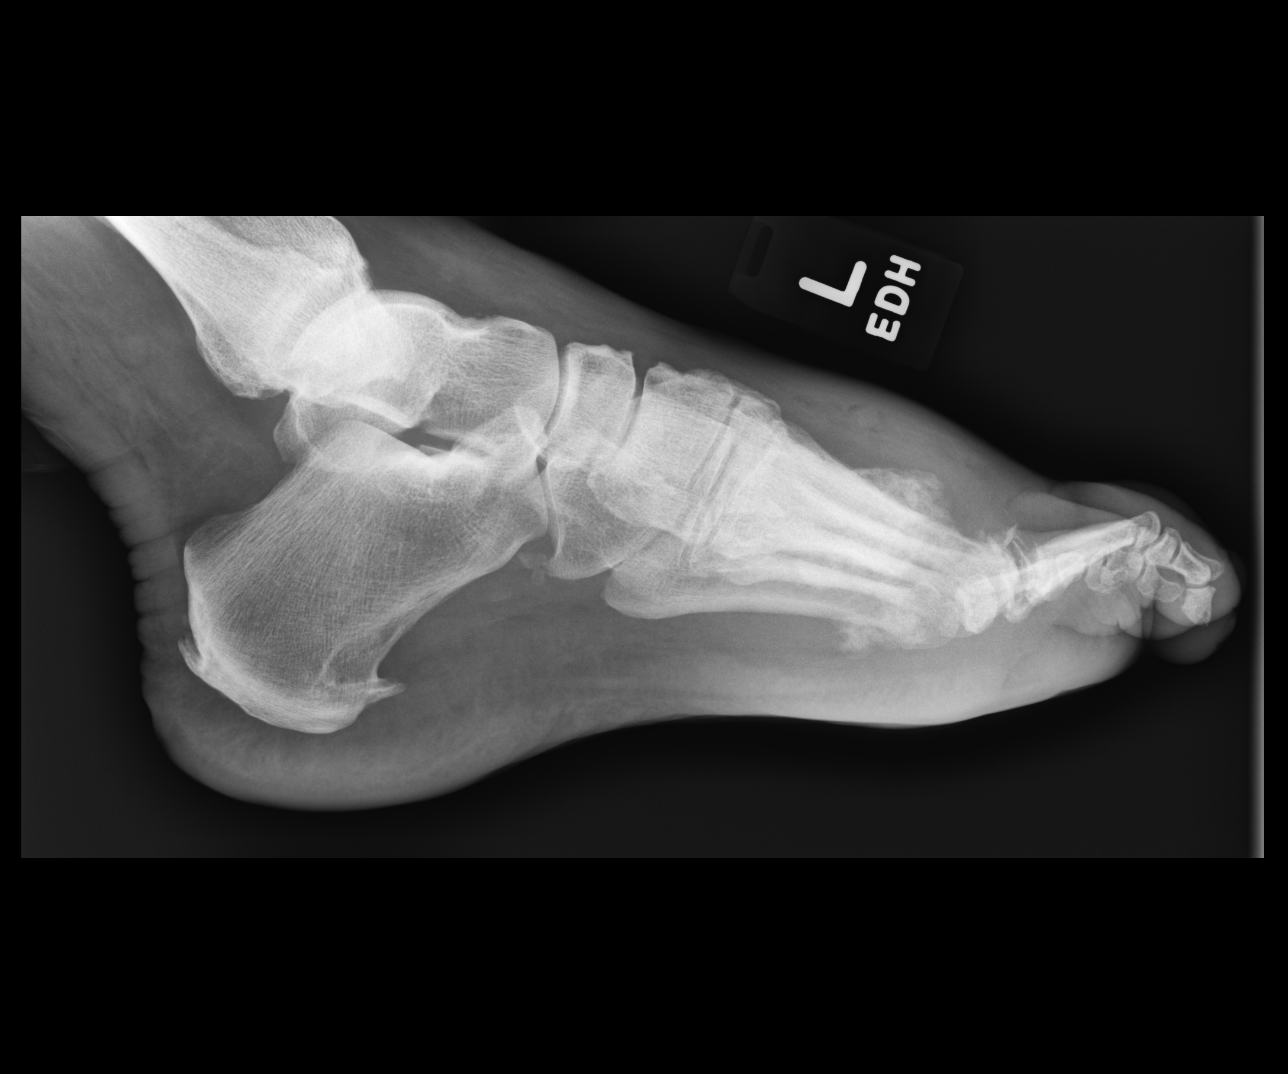

[3 of 3 positions shown; findings below may reference images not displayed]

FINDINGS: Post first tarsal metatarsal amputation at the mid shaft of the
first metatarsal now with extensive periosteal reaction in signs of
bony destruction about the first metatarsal. Similarly there is
exuberant periosteal reaction about the distal half of the second
and third metatarsals with bony resorption around the head of the
second metatarsal and with periosteal reaction about the proximal
phalanx of the second toe.

Extensive soft tissue edema is noted about the forefoot and along
the medial aspect of the foot.
IMPRESSION: Signs of soft tissue edema and osteomyelitis of the forefoot as
described

## 2022-02-09 ENCOUNTER — Encounter (HOSPITAL_COMMUNITY): Payer: Self-pay | Admitting: Surgery

## 2022-02-09 ENCOUNTER — Emergency Department (HOSPITAL_COMMUNITY): Payer: BC Managed Care – PPO

## 2022-02-09 ENCOUNTER — Inpatient Hospital Stay (HOSPITAL_COMMUNITY)
Admission: EM | Admit: 2022-02-09 | Discharge: 2022-03-19 | DRG: 820 | Disposition: E | Payer: BC Managed Care – PPO | Source: Ambulatory Visit | Attending: Surgery | Admitting: Surgery

## 2022-02-09 ENCOUNTER — Other Ambulatory Visit: Payer: Self-pay

## 2022-02-09 DIAGNOSIS — F05 Delirium due to known physiological condition: Secondary | ICD-10-CM | POA: Diagnosis not present

## 2022-02-09 DIAGNOSIS — R17 Unspecified jaundice: Secondary | ICD-10-CM | POA: Diagnosis not present

## 2022-02-09 DIAGNOSIS — Z6831 Body mass index (BMI) 31.0-31.9, adult: Secondary | ICD-10-CM

## 2022-02-09 DIAGNOSIS — R410 Disorientation, unspecified: Secondary | ICD-10-CM | POA: Diagnosis not present

## 2022-02-09 DIAGNOSIS — K55029 Acute infarction of small intestine, extent unspecified: Secondary | ICD-10-CM | POA: Diagnosis present

## 2022-02-09 DIAGNOSIS — Z66 Do not resuscitate: Secondary | ICD-10-CM | POA: Diagnosis not present

## 2022-02-09 DIAGNOSIS — R1900 Intra-abdominal and pelvic swelling, mass and lump, unspecified site: Secondary | ICD-10-CM | POA: Diagnosis not present

## 2022-02-09 DIAGNOSIS — M1990 Unspecified osteoarthritis, unspecified site: Secondary | ICD-10-CM | POA: Diagnosis present

## 2022-02-09 DIAGNOSIS — R451 Restlessness and agitation: Secondary | ICD-10-CM | POA: Diagnosis not present

## 2022-02-09 DIAGNOSIS — C8513 Unspecified B-cell lymphoma, intra-abdominal lymph nodes: Secondary | ICD-10-CM | POA: Diagnosis present

## 2022-02-09 DIAGNOSIS — L8990 Pressure ulcer of unspecified site, unspecified stage: Secondary | ICD-10-CM | POA: Insufficient documentation

## 2022-02-09 DIAGNOSIS — K9189 Other postprocedural complications and disorders of digestive system: Secondary | ICD-10-CM | POA: Diagnosis not present

## 2022-02-09 DIAGNOSIS — E785 Hyperlipidemia, unspecified: Secondary | ICD-10-CM | POA: Diagnosis present

## 2022-02-09 DIAGNOSIS — Z1611 Resistance to penicillins: Secondary | ICD-10-CM | POA: Diagnosis present

## 2022-02-09 DIAGNOSIS — Z515 Encounter for palliative care: Secondary | ICD-10-CM

## 2022-02-09 DIAGNOSIS — Z7189 Other specified counseling: Secondary | ICD-10-CM | POA: Diagnosis not present

## 2022-02-09 DIAGNOSIS — R7989 Other specified abnormal findings of blood chemistry: Secondary | ICD-10-CM | POA: Diagnosis not present

## 2022-02-09 DIAGNOSIS — I5023 Acute on chronic systolic (congestive) heart failure: Secondary | ICD-10-CM | POA: Diagnosis not present

## 2022-02-09 DIAGNOSIS — Z89431 Acquired absence of right foot: Secondary | ICD-10-CM

## 2022-02-09 DIAGNOSIS — D62 Acute posthemorrhagic anemia: Secondary | ICD-10-CM | POA: Diagnosis not present

## 2022-02-09 DIAGNOSIS — E8809 Other disorders of plasma-protein metabolism, not elsewhere classified: Secondary | ICD-10-CM | POA: Diagnosis not present

## 2022-02-09 DIAGNOSIS — K559 Vascular disorder of intestine, unspecified: Secondary | ICD-10-CM | POA: Diagnosis not present

## 2022-02-09 DIAGNOSIS — K651 Peritoneal abscess: Secondary | ICD-10-CM | POA: Diagnosis present

## 2022-02-09 DIAGNOSIS — G9341 Metabolic encephalopathy: Secondary | ICD-10-CM | POA: Diagnosis not present

## 2022-02-09 DIAGNOSIS — Y832 Surgical operation with anastomosis, bypass or graft as the cause of abnormal reaction of the patient, or of later complication, without mention of misadventure at the time of the procedure: Secondary | ICD-10-CM | POA: Diagnosis not present

## 2022-02-09 DIAGNOSIS — T8143XA Infection following a procedure, organ and space surgical site, initial encounter: Secondary | ICD-10-CM | POA: Diagnosis not present

## 2022-02-09 DIAGNOSIS — A419 Sepsis, unspecified organism: Secondary | ICD-10-CM | POA: Diagnosis not present

## 2022-02-09 DIAGNOSIS — J9601 Acute respiratory failure with hypoxia: Secondary | ICD-10-CM | POA: Diagnosis not present

## 2022-02-09 DIAGNOSIS — Z885 Allergy status to narcotic agent status: Secondary | ICD-10-CM

## 2022-02-09 DIAGNOSIS — K567 Ileus, unspecified: Secondary | ICD-10-CM | POA: Diagnosis not present

## 2022-02-09 DIAGNOSIS — M86672 Other chronic osteomyelitis, left ankle and foot: Secondary | ICD-10-CM | POA: Diagnosis present

## 2022-02-09 DIAGNOSIS — K631 Perforation of intestine (nontraumatic): Secondary | ICD-10-CM | POA: Diagnosis present

## 2022-02-09 DIAGNOSIS — I951 Orthostatic hypotension: Secondary | ICD-10-CM | POA: Diagnosis not present

## 2022-02-09 DIAGNOSIS — Z7984 Long term (current) use of oral hypoglycemic drugs: Secondary | ICD-10-CM

## 2022-02-09 DIAGNOSIS — R627 Adult failure to thrive: Secondary | ICD-10-CM | POA: Diagnosis present

## 2022-02-09 DIAGNOSIS — Y733 Surgical instruments, materials and gastroenterology and urology devices (including sutures) associated with adverse incidents: Secondary | ICD-10-CM | POA: Diagnosis not present

## 2022-02-09 DIAGNOSIS — E43 Unspecified severe protein-calorie malnutrition: Secondary | ICD-10-CM | POA: Diagnosis present

## 2022-02-09 DIAGNOSIS — Z79899 Other long term (current) drug therapy: Secondary | ICD-10-CM

## 2022-02-09 DIAGNOSIS — F32A Depression, unspecified: Secondary | ICD-10-CM | POA: Diagnosis present

## 2022-02-09 DIAGNOSIS — E869 Volume depletion, unspecified: Secondary | ICD-10-CM | POA: Diagnosis not present

## 2022-02-09 DIAGNOSIS — E860 Dehydration: Secondary | ICD-10-CM | POA: Diagnosis present

## 2022-02-09 DIAGNOSIS — I9581 Postprocedural hypotension: Secondary | ICD-10-CM | POA: Diagnosis not present

## 2022-02-09 DIAGNOSIS — J189 Pneumonia, unspecified organism: Secondary | ICD-10-CM | POA: Diagnosis not present

## 2022-02-09 DIAGNOSIS — E8770 Fluid overload, unspecified: Secondary | ICD-10-CM | POA: Diagnosis not present

## 2022-02-09 DIAGNOSIS — M7989 Other specified soft tissue disorders: Secondary | ICD-10-CM | POA: Diagnosis not present

## 2022-02-09 DIAGNOSIS — E669 Obesity, unspecified: Secondary | ICD-10-CM | POA: Diagnosis present

## 2022-02-09 DIAGNOSIS — E1140 Type 2 diabetes mellitus with diabetic neuropathy, unspecified: Secondary | ICD-10-CM | POA: Diagnosis present

## 2022-02-09 DIAGNOSIS — R6521 Severe sepsis with septic shock: Secondary | ICD-10-CM | POA: Diagnosis not present

## 2022-02-09 DIAGNOSIS — R609 Edema, unspecified: Secondary | ICD-10-CM | POA: Diagnosis not present

## 2022-02-09 DIAGNOSIS — E1165 Type 2 diabetes mellitus with hyperglycemia: Secondary | ICD-10-CM | POA: Diagnosis not present

## 2022-02-09 DIAGNOSIS — E1169 Type 2 diabetes mellitus with other specified complication: Secondary | ICD-10-CM | POA: Diagnosis present

## 2022-02-09 DIAGNOSIS — Z951 Presence of aortocoronary bypass graft: Secondary | ICD-10-CM

## 2022-02-09 DIAGNOSIS — I110 Hypertensive heart disease with heart failure: Secondary | ICD-10-CM | POA: Diagnosis present

## 2022-02-09 DIAGNOSIS — L89311 Pressure ulcer of right buttock, stage 1: Secondary | ICD-10-CM | POA: Diagnosis present

## 2022-02-09 DIAGNOSIS — E861 Hypovolemia: Secondary | ICD-10-CM | POA: Diagnosis not present

## 2022-02-09 DIAGNOSIS — K8020 Calculus of gallbladder without cholecystitis without obstruction: Secondary | ICD-10-CM | POA: Diagnosis present

## 2022-02-09 DIAGNOSIS — R1012 Left upper quadrant pain: Secondary | ICD-10-CM | POA: Diagnosis present

## 2022-02-09 DIAGNOSIS — R6510 Systemic inflammatory response syndrome (SIRS) of non-infectious origin without acute organ dysfunction: Secondary | ICD-10-CM | POA: Diagnosis not present

## 2022-02-09 DIAGNOSIS — E1151 Type 2 diabetes mellitus with diabetic peripheral angiopathy without gangrene: Secondary | ICD-10-CM | POA: Diagnosis present

## 2022-02-09 DIAGNOSIS — N179 Acute kidney failure, unspecified: Secondary | ICD-10-CM | POA: Diagnosis not present

## 2022-02-09 DIAGNOSIS — I2510 Atherosclerotic heart disease of native coronary artery without angina pectoris: Secondary | ICD-10-CM | POA: Diagnosis present

## 2022-02-09 DIAGNOSIS — E874 Mixed disorder of acid-base balance: Secondary | ICD-10-CM | POA: Diagnosis not present

## 2022-02-09 DIAGNOSIS — L89321 Pressure ulcer of left buttock, stage 1: Secondary | ICD-10-CM | POA: Diagnosis present

## 2022-02-09 DIAGNOSIS — F419 Anxiety disorder, unspecified: Secondary | ICD-10-CM | POA: Diagnosis present

## 2022-02-09 HISTORY — DX: Atherosclerotic heart disease of native coronary artery without angina pectoris: I25.10

## 2022-02-09 LAB — COMPREHENSIVE METABOLIC PANEL
ALT: 17 U/L (ref 0–44)
AST: 24 U/L (ref 15–41)
Albumin: 2.6 g/dL — ABNORMAL LOW (ref 3.5–5.0)
Alkaline Phosphatase: 93 U/L (ref 38–126)
Anion gap: 14 (ref 5–15)
BUN: 19 mg/dL (ref 6–20)
CO2: 22 mmol/L (ref 22–32)
Calcium: 8.5 mg/dL — ABNORMAL LOW (ref 8.9–10.3)
Chloride: 97 mmol/L — ABNORMAL LOW (ref 98–111)
Creatinine, Ser: 1.57 mg/dL — ABNORMAL HIGH (ref 0.61–1.24)
GFR, Estimated: 53 mL/min — ABNORMAL LOW (ref >60)
Glucose, Bld: 275 mg/dL — ABNORMAL HIGH (ref 70–99)
Potassium: 3.9 mmol/L (ref 3.5–5.1)
Sodium: 133 mmol/L — ABNORMAL LOW (ref 135–145)
Total Bilirubin: 1.5 mg/dL — ABNORMAL HIGH (ref 0.3–1.2)
Total Protein: 7 g/dL (ref 6.5–8.1)

## 2022-02-09 LAB — CBC WITH DIFFERENTIAL/PLATELET
Abs Immature Granulocytes: 0.14 10*3/uL — ABNORMAL HIGH (ref 0.00–0.07)
Basophils Absolute: 0.1 10*3/uL (ref 0.0–0.1)
Basophils Relative: 0 %
Eosinophils Absolute: 0 10*3/uL (ref 0.0–0.5)
Eosinophils Relative: 0 %
HCT: 38.4 % — ABNORMAL LOW (ref 39.0–52.0)
Hemoglobin: 11.3 g/dL — ABNORMAL LOW (ref 13.0–17.0)
Immature Granulocytes: 1 %
Lymphocytes Relative: 6 %
Lymphs Abs: 1.1 10*3/uL (ref 0.7–4.0)
MCH: 22.9 pg — ABNORMAL LOW (ref 26.0–34.0)
MCHC: 29.4 g/dL — ABNORMAL LOW (ref 30.0–36.0)
MCV: 77.9 fL — ABNORMAL LOW (ref 80.0–100.0)
Monocytes Absolute: 1.7 10*3/uL — ABNORMAL HIGH (ref 0.1–1.0)
Monocytes Relative: 9 %
Neutro Abs: 16.6 10*3/uL — ABNORMAL HIGH (ref 1.7–7.7)
Neutrophils Relative %: 84 %
Platelets: 606 10*3/uL — ABNORMAL HIGH (ref 150–400)
RBC: 4.93 MIL/uL (ref 4.22–5.81)
RDW: 14.7 % (ref 11.5–15.5)
WBC: 19.7 10*3/uL — ABNORMAL HIGH (ref 4.0–10.5)
nRBC: 0 % (ref 0.0–0.2)

## 2022-02-09 LAB — LACTIC ACID, PLASMA
Lactic Acid, Venous: 1.7 mmol/L (ref 0.5–1.9)
Lactic Acid, Venous: 1.5 mmol/L (ref 0.5–1.9)

## 2022-02-09 LAB — URINALYSIS, ROUTINE W REFLEX MICROSCOPIC
Glucose, UA: NEGATIVE mg/dL
Hgb urine dipstick: NEGATIVE
Ketones, ur: NEGATIVE mg/dL
Leukocytes,Ua: NEGATIVE
Nitrite: NEGATIVE
Protein, ur: 30 mg/dL — AB
Specific Gravity, Urine: >1.046 — ABNORMAL HIGH (ref 1.005–1.030)
pH: 5 (ref 5.0–8.0)

## 2022-02-09 LAB — LIPASE, BLOOD: Lipase: 26 U/L (ref 11–51)

## 2022-02-09 MED ORDER — HYDROMORPHONE HCL 1 MG/ML IJ SOLN
1.0000 mg | Freq: Once | INTRAMUSCULAR | Status: AC
  Administered 2022-02-09: 1 mg via INTRAVENOUS
  Filled 2022-02-09: qty 1

## 2022-02-09 MED ORDER — ENOXAPARIN SODIUM 40 MG/0.4ML IJ SOSY: 40.0000 mg | PREFILLED_SYRINGE | INTRAMUSCULAR | Status: DC

## 2022-02-09 MED ORDER — PROCHLORPERAZINE EDISYLATE 10 MG/2ML IJ SOLN
10.0000 mg | INTRAMUSCULAR | Status: DC | PRN
  Administered 2022-02-10: 10 mg via INTRAVENOUS
  Filled 2022-02-09 (×2): qty 2

## 2022-02-09 MED ORDER — OXYCODONE HCL 5 MG PO TABS: 5.0000 mg | ORAL_TABLET | ORAL | Status: DC | PRN

## 2022-02-09 MED ORDER — OXYCODONE HCL 5 MG PO TABS: 10.0000 mg | ORAL_TABLET | ORAL | Status: DC | PRN

## 2022-02-09 MED ORDER — ONDANSETRON HCL 4 MG/2ML IJ SOLN
4.0000 mg | Freq: Once | INTRAMUSCULAR | Status: AC
  Administered 2022-02-09: 4 mg via INTRAVENOUS
  Filled 2022-02-09: qty 2

## 2022-02-09 MED ORDER — SIMETHICONE 80 MG PO CHEW: 80.0000 mg | CHEWABLE_TABLET | Freq: Four times a day (QID) | ORAL | Status: DC | PRN

## 2022-02-09 MED ORDER — PIPERACILLIN-TAZOBACTAM 3.375 G IVPB
3.3750 g | Freq: Three times a day (TID) | INTRAVENOUS | Status: DC
  Administered 2022-02-10 – 2022-02-15 (×15): 3.375 g via INTRAVENOUS
  Filled 2022-02-09 (×15): qty 50

## 2022-02-09 MED ORDER — ACETAMINOPHEN 325 MG PO TABS
650.0000 mg | ORAL_TABLET | Freq: Four times a day (QID) | ORAL | Status: DC
  Administered 2022-02-09 – 2022-02-10 (×2): 650 mg via ORAL
  Filled 2022-02-09 (×2): qty 2

## 2022-02-09 MED ORDER — PIPERACILLIN-TAZOBACTAM 3.375 G IVPB 30 MIN
3.3750 g | Freq: Once | INTRAVENOUS | Status: AC
  Administered 2022-02-09: 3.375 g via INTRAVENOUS
  Filled 2022-02-09: qty 50

## 2022-02-09 MED ORDER — DOCUSATE SODIUM 100 MG PO CAPS
100.0000 mg | ORAL_CAPSULE | Freq: Two times a day (BID) | ORAL | Status: DC
  Administered 2022-02-09: 100 mg via ORAL
  Filled 2022-02-09: qty 1

## 2022-02-09 MED ORDER — HYDROMORPHONE HCL 1 MG/ML IJ SOLN
1.0000 mg | INTRAMUSCULAR | Status: DC | PRN
  Administered 2022-02-10: 1 mg via INTRAVENOUS
  Filled 2022-02-09: qty 1

## 2022-02-09 MED ORDER — GABAPENTIN 300 MG PO CAPS
300.0000 mg | ORAL_CAPSULE | Freq: Three times a day (TID) | ORAL | Status: DC
  Administered 2022-02-09: 300 mg via ORAL
  Filled 2022-02-09: qty 1

## 2022-02-09 MED ORDER — METHOCARBAMOL 1000 MG/10ML IJ SOLN
500.0000 mg | Freq: Four times a day (QID) | INTRAVENOUS | Status: DC | PRN
  Filled 2022-02-09: qty 5

## 2022-02-09 MED ORDER — LACTATED RINGERS IV SOLN: INTRAVENOUS | Status: DC

## 2022-02-09 MED ORDER — ONDANSETRON HCL 4 MG/2ML IJ SOLN
4.0000 mg | Freq: Four times a day (QID) | INTRAMUSCULAR | Status: DC | PRN
  Administered 2022-02-10 – 2022-02-17 (×4): 4 mg via INTRAVENOUS
  Filled 2022-02-09 (×5): qty 2

## 2022-02-09 MED ORDER — SODIUM CHLORIDE 0.9 % IV BOLUS
1000.0000 mL | Freq: Once | INTRAVENOUS | Status: AC
  Administered 2022-02-09: 1000 mL via INTRAVENOUS

## 2022-02-09 NOTE — ED Triage Notes (Signed)
Pt has had vomiting and diarrhea x 5 days. Diarrhea has been getting worse

## 2022-02-09 NOTE — ED Triage Notes (Signed)
PT sent over from PCP for ischemic bowel. Pt started having abd pain 4 weeks ago. Saw PCP last week and had [place] today. Pt is also anemic and scheduled to have blood transfusion this Thursday. Pt complains of LUQ pain. Fevers on and off for 1 week.

## 2022-02-09 NOTE — H&P (Signed)
Admitting Physician: Nickola Major Tildon Silveria  Service: General Surgery  CC: Abdominal pain  Subjective   HPI: Patrick [name] is an 52 y.o. male who is here for abdominal pain.  He has had severe abdominal pain for the last 4 weeks.  It has been very severe over the last week.  Pain is focused in the left upper quadrant.  He feels fullness in this area too.  Occasionally he has the feeling of his left abdomen swelling then  a loud moment of bowel sounds as it goes down.  He was evaluated by his PCP who ordered an ultrasound and CT to evaluate.  He was directed to the ER after pneumatosis and possible pneumoperitoneum was noted on the scan.  He does not use alcohol or tobacco.  He does not use NSAIDS.  Past Medical History:  Diagnosis Date   Anxiety    Arthritis    Chronic osteomyelitis of left foot ([place]) 12/20/2018   Chronic osteomyelitis of right foot ([place]) 12/20/2018   Diabetes mellitus without complication ([place])    Diabetic foot infection ([place]) 12/20/2018   Diabetic neuropathy ([place]) 12/20/2018   History of kidney stones    Morbid obesity ([place]) 12/20/2018   MSSA (methicillin susceptible Staphylococcus aureus) infection 12/20/2018   PONV (postoperative nausea and vomiting)     Past Surgical History:  Procedure Laterality Date   AMPUTATION Right 05/10/2018   Procedure: PARTIAL 5TH RAY AMPUTATION RIGHT FOOT;  Surgeon: Caprice Beaver, DPM;  Location: [facility];  Service: Podiatry;  Laterality: Right;   AMPUTATION Left 10/26/2018   Procedure: PARTIAL 1ST RAY AMPUTATION FOOT;  Surgeon: Caprice Beaver, DPM;  Location: [facility];  Service: Podiatry;  Laterality: Left;   APPLICATION OF WOUND VAC Right 05/10/2018   Procedure: APPLICATION OF WOUND VAC RIGHT FOOT;  Surgeon: Caprice Beaver, DPM;  Location: [facility];  Service: Podiatry;  Laterality: Right;   CORONARY ARTERY BYPASS GRAFT  04/01/2020   CABG x 5 (LIMA-LAD, RCA-PDA, VG-Diag, VG-OM, SVG-RPL) and LAA Clip at [facility] in [place] with Sande Brothers, MD   [place] Right 05/10/2018   Procedure: DEBRIDEMENT PUNCTURE WOUND;  Surgeon: Caprice Beaver, DPM;  Location: [facility];  Service: Podiatry;  Laterality: Right;   WOUND DEBRIDEMENT Left 05/10/2018   Procedure: DEBRIDEMENT ULCERATION LEFT FOOT;  Surgeon: Caprice Beaver, DPM;  Location: [facility];  Service: Podiatry;  Laterality: Left;   WOUND DEBRIDEMENT Left 10/26/2018   Procedure: DEBRIDEMENT ULCER LEFT FOOT;  Surgeon: Caprice Beaver, DPM;  Location: [facility];  Service: Podiatry;  Laterality: Left;    No family history on file.  Social:  reports that he has never smoked. He quit smokeless tobacco use about 32 years ago.  His smokeless tobacco use included chew. He reports that he does not currently use alcohol. He reports that he does not use drugs.  Allergies:  Allergies  Allergen Reactions   Morphine     hallucinations    Medications: Current Outpatient Medications  Medication Instructions   atorvastatin (LIPITOR) 20 mg, Oral, Every evening   glipiZIDE (GLUCOTROL XL) 5 mg, Oral, Daily   HYDROcodone-acetaminophen (NORCO) 7.5-325 MG tablet 1 tablet, Oral, Every 4 hours PRN   Jardiance 25 mg, Oral, BH-each morning   metFORMIN (GLUCOPHAGE-XR) 500 mg, Oral, 2 times daily   Multiple Vitamins-Minerals (MULTIVITAMIN WITH MINERALS) tablet 1 tablet, Oral, Daily   Polyethyl Glycol-Propyl Glycol (SYSTANE OP) 1 drop, Both Eyes, Daily PRN   sertraline (ZOLOFT) 100 mg, Oral,  BH-each morning    ROS - all of the below systems have been reviewed with the patient and positives are indicated with bold text General: chills, fever or night sweats Eyes: blurry vision or double vision ENT: epistaxis or sore throat Allergy/Immunology: itchy/watery eyes or nasal congestion Hematologic/Lymphatic: bleeding problems, blood clots or swollen lymph nodes Endocrine: temperature intolerance or unexpected weight changes Breast: new or changing breast lumps or  nipple discharge Resp: cough, shortness of breath, or wheezing CV: chest pain or dyspnea on exertion GI: as per HPI GU: dysuria, trouble voiding, or hematuria MSK: joint pain or joint stiffness Neuro: TIA or stroke symptoms Derm: pruritus and skin lesion changes Psych: anxiety and depression  Objective   PE Blood pressure 114/64, pulse (!) 116, temperature 99.8 F (37.7 C), temperature source Oral, resp. rate (!) 26, height 6' (1.829 m), weight 113.4 kg, SpO2 96 %. Constitutional: NAD; conversant; no deformities Eyes: Moist conjunctiva; no lid lag; anicteric; PERRL Neck: Trachea midline; no thyromegaly Lungs: Normal respiratory effort; no tactile fremitus CV: RRR; no palpable thrills; no pitting edema GI: Abd Right side soft, nontender. left upper quadrant fullness to palpation with severe tenderness in this area MSK: Normal range of motion of extremities; no clubbing/cyanosis Psychiatric: Appropriate affect; alert and oriented x3 Lymphatic: No palpable cervical or axillary lymphadenopathy  Results for orders placed or performed during the hospital encounter of 02/07/2022 (from the past 24 hour(s))  CBC with Differential     Status: Abnormal   Collection Time: 01/30/2022  5:10 PM  Result Value Ref Range   WBC 19.7 (H) 4.0 - 10.5 K/uL   RBC 4.93 4.22 - 5.81 MIL/uL   Hemoglobin 11.3 (L) 13.0 - 17.0 g/dL   HCT 38.4 (L) 39.0 - 52.0 %   MCV 77.9 (L) 80.0 - 100.0 fL   MCH 22.9 (L) 26.0 - 34.0 pg   MCHC 29.4 (L) 30.0 - 36.0 g/dL   RDW 14.7 11.5 - 15.5 %   Platelets 606 (H) 150 - 400 K/uL   nRBC 0.0 0.0 - 0.2 %   Neutrophils Relative % 84 %   Neutro Abs 16.6 (H) 1.7 - 7.7 K/uL   Lymphocytes Relative 6 %   Lymphs Abs 1.1 0.7 - 4.0 K/uL   Monocytes Relative 9 %   Monocytes Absolute 1.7 (H) 0.1 - 1.0 K/uL   Eosinophils Relative 0 %   Eosinophils Absolute 0.0 0.0 - 0.5 K/uL   Basophils Relative 0 %   Basophils Absolute 0.1 0.0 - 0.1 K/uL   Immature Granulocytes 1 %   Abs Immature  Granulocytes 0.14 (H) 0.00 - 0.07 K/uL  Comprehensive metabolic panel     Status: Abnormal   Collection Time: 02/07/2022  5:10 PM  Result Value Ref Range   Sodium 133 (L) 135 - 145 mmol/L   Potassium 3.9 3.5 - 5.1 mmol/L   Chloride 97 (L) 98 - 111 mmol/L   CO2 22 22 - 32 mmol/L   Glucose, Bld 275 (H) 70 - 99 mg/dL   BUN 19 6 - 20 mg/dL   Creatinine, Ser 1.57 (H) 0.61 - 1.24 mg/dL   Calcium 8.5 (L) 8.9 - 10.3 mg/dL   Total Protein 7.0 6.5 - 8.1 g/dL   Albumin 2.6 (L) 3.5 - 5.0 g/dL   AST 24 15 - 41 U/L   ALT 17 0 - 44 U/L   Alkaline Phosphatase 93 38 - 126 U/L   Total Bilirubin 1.5 (H) 0.3 - 1.2 mg/dL   GFR,  Estimated 53 (L) >60 mL/min   Anion gap 14 5 - 15  Lipase, blood     Status: None   Collection Time: 02/03/2022  5:10 PM  Result Value Ref Range   Lipase 26 11 - 51 U/L  Lactic acid, plasma     Status: None   Collection Time: 01/18/2022  5:10 PM  Result Value Ref Range   Lactic Acid, Venous 1.7 0.5 - 1.9 mmol/L  Type and screen [place]     Status: None   Collection Time: 01/28/2022  5:10 PM  Result Value Ref Range   ABO/RH(D) O POS    Antibody Screen NEG    Sample Expiration      02/12/2022,2359 Performed at [facility], [place] [address], [place], [place] [address]     Imaging Orders  No imaging studies ordered today   CT w/ IV contrast at [facility] 01/18/2022 at 1528 1.  Findings consistent with extensive small bowel ischemia with pneumatosis in the left upper quadrant with surrounding inflammatory changes and probable adjacent pneumoperitoneum.  2.  Mild Luce thickening seen within the mid body of the stomach which could be due to concomitant gastritis.   CT w/o Contrast at [facility] ER 01/28/2022 at 2215  - repeated scan since [employer] was not working to share images from [place].  1. Marked severity colitis involving the distal transverse colon with an adjacent 12.5 cm x 15.3 cm x 17.8 cm abscess within the mid and upper left abdomen, as described  above. The presence of an underlying neoplastic process cannot be excluded. 2. Markedly inflamed, likely ischemic, partially obstructed proximal small bowel loops within the left upper quadrant. 3. Cholelithiasis. 4. Heterogeneous enhancement of the renal parenchyma which may represent sequelae associated with acute pyelonephritis. Correlation with urinalysis is recommended. 5. Small fat-containing umbilical hernia. 6. Evidence of prior median sternotomy with lingular and left lower lobe linear atelectasis. 7. Mild to moderate amount of pelvic free fluid. 8. Mild prostatomegaly.  Correlation with PSA values is recommended. 9. Aortic atherosclerosis.   On my review, there is an area of fluid/gas and inflamation in the left upper quadrant near the greater curve of the stomach, distal transverse/splenic flexure of the colon, tail of the pancreas and small intestine.  Difficult to tell what process is causing this or which organ is the culprit.  I wonder if a necrotic GIST tumor could be the issue in addition to the concerns voiced by the radiologsits above.  Does not appear to have free perforation or free pneumoperitoneum.      Assessment and Plan   Patrick [name] is an 52 y.o. male with abdominal pain.  On CT he has an area of gas, fluid and severe inflamation in the left upper quadrant.  It does not appear on exam or CT that he has free perforation/pneumoperitoneum/or peritoneal signs throughout the abdomen, this process seems contained to the left upper quadrant.  His symptoms have been ongoing for 4 weeks.  I wonder if this is a necrotic GIST tumor hanging off the greater curve of the stomach.  I recommend IVF resuscitation, antibiotics and I will review the case with radiology, my partners on call and my partners coming on in the morning.  As the pathology seems contained in the left upper quadrant and the patient has stable vitals and a normal lactic acid, I feel focusing on resuscitation  tonight and additional workup or surgery in the morning is appropriate.  If he worsens we can proceed with emergent exploration.      ICD-10-CM   1. 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 Use [email or web address] to contact on call provider  New Patient Billing: [phone] - High MDM

## 2022-02-09 NOTE — ED Provider Triage Note (Signed)
Emergency Medicine Provider Triage Evaluation Note  Patrick [name] , [name] 52 y.o. male  was evaluated in triage.  Pt complains of was sent from primary care provider for concerns for ischemic bowel.  Patient had abdominal pain 4 weeks ago.  Was evaluated with primary care provider last week had ultrasound completed today.  Patient also anemic and scheduled for [name] blood transfusion in 2 days.  Abdominal pain has been localized to the left upper quadrant region.  Has associated intermittent fevers x 1 week.  Per pt chart review: Patient had ultrasound abdomen completed today.  Patient also had [name] CT abdomen pelvis with contrast that noted concerns for extensive small bowel ischemia with pneumatosis in the left upper quadrant with surrounding inflammatory changes and probable adjacent pneumoperitoneum.  Mild Angelini thickening seen within the mid body of the stomach due to concomitant gastritis.   Review of Systems  Positive:  Negative:   Physical Exam  BP 94/70 (BP Location: Right Arm)   Pulse (!) 131   Temp (!) 100.7 F (38.2 C) (Oral)   Resp (!) 22   Ht 6' (1.829 m)   Wt 113.4 kg   SpO2 100%   BMI 33.91 kg/m  Gen:   Awake, no distress   Resp:  Normal effort  MSK:   Moves extremities without difficulty  Other:  Moderate TTP noted diffusely throughout abdomen more so to left sided abdomen.   Medical Decision Making  Medically screening exam initiated at 5:00 PM.  Appropriate orders placed.  Patrick [name] was informed that the remainder of the evaluation will be completed by another provider, this initial triage assessment does not replace that evaluation, and the importance of remaining in the ED until their evaluation is complete.  5:01 PM - Discussed with RN that patient is in need of [name] room immediately. RN aware and working on room placement.    Patrick Shakir A, PA-C 01/26/2022 1707

## 2022-02-09 NOTE — ED Provider Notes (Cosign Needed Addendum)
[place] Provider Note   CSN: [id number] Arrival date & time: 02/12/2022  1615     History  Chief Complaint  Patient presents with   Abdominal Pain    Patrick [name] is a 52 y.o. male.  Pt sent to ED after outpt ct scan shows extensive ischemic bowel with pneumatosis in the left upper quadrant.  Pt saw his primary MD and had an ultrasound followed by ct scan.  Pt has a history of diabetes, CAD and peripheral vascular disease.    The history is provided by the patient. No language interpreter was used.  Abdominal Pain Pain location:  Generalized Pain quality: aching   Pain radiates to:  Does not radiate Pain severity:  Moderate Onset quality:  Gradual Duration:  4 weeks Timing:  Constant Progression:  Worsening Chronicity:  New Relieved by:  Nothing Worsened by:  Movement and deep breathing Ineffective treatments:  None tried Associated symptoms: nausea   Risk factors: multiple surgeries   Risk factors: no alcohol abuse        Home Medications Prior to Admission medications   Medication Sig Start Date End Date Taking? Authorizing Provider  atorvastatin (LIPITOR) 20 MG tablet Take 20 mg by mouth every evening.    [provider]  glipiZIDE (GLUCOTROL XL) 5 MG 24 hr tablet Take 5 mg by mouth daily. 08/02/18   [provider]  HYDROcodone-acetaminophen (NORCO) 7.5-325 MG tablet Take 1 tablet by mouth every 4 (four) hours as needed. 10/24/18   [provider]  JARDIANCE 25 MG TABS tablet Take 25 mg by mouth every morning. 04/05/18   [provider]  metFORMIN (GLUCOPHAGE-XR) 500 MG 24 hr tablet Take 500 mg by mouth 2 (two) times daily. 04/05/18   [provider]  Multiple Vitamins-Minerals (MULTIVITAMIN WITH MINERALS) tablet Take 1 tablet by mouth daily.    [provider]  Polyethyl Glycol-Propyl Glycol (SYSTANE OP) Place 1 drop into both eyes daily as needed (dry eyes).     [provider]  sertraline (ZOLOFT) 100 MG tablet Take 100 mg by mouth every morning. 01/31/18   [provider]      Allergies    Morphine    Review of Systems   Review of Systems  Gastrointestinal:  Positive for abdominal pain and nausea.  All other systems reviewed and are negative.   Physical Exam Updated Vital Signs BP 94/70 (BP Location: Right Arm)   Pulse (!) 131   Temp (!) 100.7 F (38.2 C) (Oral)   Resp (!) 22   Ht 6' (1.829 m)   Wt 113.4 kg   SpO2 100%   BMI 33.91 kg/m  Physical Exam Vitals and nursing note reviewed.  Constitutional:      Appearance: He is well-developed. He is ill-appearing.  HENT:     Head: Normocephalic.  Eyes:     Extraocular Movements: Extraocular movements intact.  Cardiovascular:     Rate and Rhythm: Tachycardia present.  Pulmonary:     Effort: Pulmonary effort is normal.  Abdominal:     General: There is distension.  Musculoskeletal:        General: Normal range of motion.     Cervical back: Normal range of motion.  Skin:    General: Skin is warm.  Neurological:     General: No focal deficit present.     Mental Status: He is alert and oriented to person, place, and time.  Psychiatric:  Mood and Affect: Mood normal.    ED Results / Procedures / Treatments   Labs (all labs ordered are listed, but only abnormal results are displayed) Labs Reviewed  CBC WITH DIFFERENTIAL/PLATELET - Abnormal; Notable for the following components:      Result Value   WBC 19.7 (*)    Hemoglobin 11.3 (*)    HCT 38.4 (*)    MCV 77.9 (*)    MCH 22.9 (*)    MCHC 29.4 (*)    Platelets 606 (*)    Neutro Abs 16.6 (*)    Monocytes Absolute 1.7 (*)    Abs Immature Granulocytes 0.14 (*)    All other components within normal limits  CULTURE, BLOOD (ROUTINE X 2)  CULTURE, BLOOD (ROUTINE X 2)  LACTIC ACID, PLASMA  COMPREHENSIVE METABOLIC PANEL  LIPASE, BLOOD  URINALYSIS, ROUTINE W REFLEX MICROSCOPIC  LACTIC ACID, PLASMA   TYPE AND SCREEN    EKG None  Radiology No results found.  Procedures Procedures    Medications Ordered in ED Medications  sodium chloride 0.9 % bolus 1,000 mL (has no administration in time range)  HYDROmorphone (DILAUDID) injection 1 mg (has no administration in time range)  ondansetron (ZOFRAN) injection 4 mg (has no administration in time range)  piperacillin-tazobactam (ZOSYN) IVPB 3.375 g (has no administration in time range)  sodium chloride 0.9 % bolus 1,000 mL (0 mLs Intravenous Stopped 01/27/2022 1855)    ED Course/ Medical Decision Making/ A&P                             Medical Decision Making Pt complains of severe abdominal pain.  Pt had an outpt ct scan and was sent to ED to see surgery.    Amount and/or Complexity of Data Reviewed Independent Historian: spouse    Details: Pt is here with his wife who is supportive and gives pt's history  External Data Reviewed: radiology.    Details: Ct scan from [facility] reviewed  Labs: ordered. Decision-making details documented in ED Course.    Details: Labs ordered reviewed and interpreted.  Discussion of management or test interpretation with external provider(s): I spoke with Dr. Marijo Sanes  General surgeon on call.  He will see pt here   Risk Prescription drug management. Parenteral controlled substances. Decision regarding hospitalization. Risk Details: Pt given Iv fluids, Zosyn, dilaudid and zofran.            Final Clinical Impression(s) / ED Diagnoses Final diagnoses:  Ischemic bowel disease [facility])    Rx / [place] Orders ED Discharge Orders     None         Sidney Ace 01/26/2022 1931    Sidney Ace 02/15/2022 2338    Pattricia Boss, MD 01/26/2022 [phone]

## 2022-02-10 ENCOUNTER — Inpatient Hospital Stay (HOSPITAL_COMMUNITY): Payer: BC Managed Care – PPO | Admitting: Anesthesiology

## 2022-02-10 ENCOUNTER — Encounter (HOSPITAL_COMMUNITY): Payer: Self-pay

## 2022-02-10 ENCOUNTER — Encounter (HOSPITAL_COMMUNITY): Admission: EM | Disposition: E | Payer: Self-pay | Source: Ambulatory Visit

## 2022-02-10 ENCOUNTER — Other Ambulatory Visit: Payer: Self-pay

## 2022-02-10 DIAGNOSIS — R1900 Intra-abdominal and pelvic swelling, mass and lump, unspecified site: Secondary | ICD-10-CM | POA: Diagnosis not present

## 2022-02-10 HISTORY — PX: COLECTOMY WITH COLOSTOMY CREATION/HARTMANN PROCEDURE: SHX6598

## 2022-02-10 HISTORY — PX: LAPAROTOMY: SHX154

## 2022-02-10 HISTORY — PX: APPLICATION OF WOUND VAC: SHX5189

## 2022-02-10 LAB — CBC
HCT: 29.4 % — ABNORMAL LOW (ref 39.0–52.0)
HCT: 31.6 % — ABNORMAL LOW (ref 39.0–52.0)
HCT: 33.9 % — ABNORMAL LOW (ref 39.0–52.0)
Hemoglobin: 10.3 g/dL — ABNORMAL LOW (ref 13.0–17.0)
Hemoglobin: 9.2 g/dL — ABNORMAL LOW (ref 13.0–17.0)
Hemoglobin: 9.5 g/dL — ABNORMAL LOW (ref 13.0–17.0)
MCH: 23 pg — ABNORMAL LOW (ref 26.0–34.0)
MCH: 23.6 pg — ABNORMAL LOW (ref 26.0–34.0)
MCH: 23.9 pg — ABNORMAL LOW (ref 26.0–34.0)
MCHC: 30.1 g/dL (ref 30.0–36.0)
MCHC: 30.4 g/dL (ref 30.0–36.0)
MCHC: 31.3 g/dL (ref 30.0–36.0)
MCV: 76.4 fL — ABNORMAL LOW (ref 80.0–100.0)
MCV: 76.5 fL — ABNORMAL LOW (ref 80.0–100.0)
MCV: 77.6 fL — ABNORMAL LOW (ref 80.0–100.0)
Platelets: 450 10*3/uL — ABNORMAL HIGH (ref 150–400)
Platelets: 457 10*3/uL — ABNORMAL HIGH (ref 150–400)
Platelets: 490 10*3/uL — ABNORMAL HIGH (ref 150–400)
RBC: 3.85 MIL/uL — ABNORMAL LOW (ref 4.22–5.81)
RBC: 4.13 MIL/uL — ABNORMAL LOW (ref 4.22–5.81)
RBC: 4.37 MIL/uL (ref 4.22–5.81)
RDW: 14.6 % (ref 11.5–15.5)
RDW: 14.7 % (ref 11.5–15.5)
RDW: 14.9 % (ref 11.5–15.5)
WBC: 17.3 10*3/uL — ABNORMAL HIGH (ref 4.0–10.5)
WBC: 17.7 10*3/uL — ABNORMAL HIGH (ref 4.0–10.5)
WBC: 7.4 10*3/uL (ref 4.0–10.5)
nRBC: 0 % (ref 0.0–0.2)
nRBC: 0 % (ref 0.0–0.2)
nRBC: 0 % (ref 0.0–0.2)

## 2022-02-10 LAB — POCT I-STAT 7, (LYTES, BLD GAS, ICA,H+H)
Acid-base deficit: 6 mmol/L — ABNORMAL HIGH (ref 0.0–2.0)
Bicarbonate: 19.7 mmol/L — ABNORMAL LOW (ref 20.0–28.0)
Calcium, Ion: 1.03 mmol/L — ABNORMAL LOW (ref 1.15–1.40)
HCT: 28 % — ABNORMAL LOW (ref 39.0–52.0)
Hemoglobin: 9.5 g/dL — ABNORMAL LOW (ref 13.0–17.0)
O2 Saturation: 99 %
Potassium: 4.1 mmol/L (ref 3.5–5.1)
Sodium: 138 mmol/L (ref 135–145)
TCO2: 21 mmol/L — ABNORMAL LOW (ref 22–32)
pCO2 arterial: 37.4 mmHg (ref 32–48)
pH, Arterial: 7.329 — ABNORMAL LOW (ref 7.35–7.45)
pO2, Arterial: 133 mmHg — ABNORMAL HIGH (ref 83–108)

## 2022-02-10 LAB — PREPARE RBC (CROSSMATCH)

## 2022-02-10 LAB — BASIC METABOLIC PANEL
Anion gap: 10 (ref 5–15)
BUN: 19 mg/dL (ref 6–20)
CO2: 21 mmol/L — ABNORMAL LOW (ref 22–32)
Calcium: 7.6 mg/dL — ABNORMAL LOW (ref 8.9–10.3)
Chloride: 103 mmol/L (ref 98–111)
Creatinine, Ser: 1.4 mg/dL — ABNORMAL HIGH (ref 0.61–1.24)
GFR, Estimated: >60 mL/min (ref >60)
Glucose, Bld: 144 mg/dL — ABNORMAL HIGH (ref 70–99)
Potassium: 4 mmol/L (ref 3.5–5.1)
Sodium: 134 mmol/L — ABNORMAL LOW (ref 135–145)

## 2022-02-10 LAB — GLUCOSE, CAPILLARY
Glucose-Capillary: 163 mg/dL — ABNORMAL HIGH (ref 70–99)
Glucose-Capillary: 154 mg/dL — ABNORMAL HIGH (ref 70–99)
Glucose-Capillary: 170 mg/dL — ABNORMAL HIGH (ref 70–99)

## 2022-02-10 LAB — CREATININE, SERUM
Creatinine, Ser: 1.49 mg/dL — ABNORMAL HIGH (ref 0.61–1.24)
GFR, Estimated: 57 mL/min — ABNORMAL LOW (ref >60)

## 2022-02-10 LAB — ABO/RH: ABO/RH(D): O POS

## 2022-02-10 LAB — HEMOGLOBIN A1C
Hgb A1c MFr Bld: 6.6 % — ABNORMAL HIGH (ref 4.8–5.6)
Mean Plasma Glucose: 142.72 mg/dL

## 2022-02-10 LAB — CBG MONITORING, ED: Glucose-Capillary: 151 mg/dL — ABNORMAL HIGH (ref 70–99)

## 2022-02-10 LAB — HIV ANTIBODY (ROUTINE TESTING W REFLEX): HIV Screen 4th Generation wRfx: NONREACTIVE

## 2022-02-10 SURGERY — LAPAROTOMY, EXPLORATORY
Anesthesia: General

## 2022-02-10 MED ORDER — ORAL CARE MOUTH RINSE
15.0000 mL | OROMUCOSAL | Status: DC | PRN
  Administered 2022-03-01: 15 mL via OROMUCOSAL

## 2022-02-10 MED ORDER — PROPOFOL 10 MG/ML IV BOLUS
INTRAVENOUS | Status: AC
  Filled 2022-02-10: qty 20

## 2022-02-10 MED ORDER — PROPOFOL 10 MG/ML IV BOLUS
INTRAVENOUS | Status: DC | PRN
  Administered 2022-02-10: 100 mg via INTRAVENOUS

## 2022-02-10 MED ORDER — MIDAZOLAM HCL 2 MG/2ML IJ SOLN
INTRAMUSCULAR | Status: AC
  Filled 2022-02-10: qty 2

## 2022-02-10 MED ORDER — INSULIN ASPART 100 UNIT/ML IJ SOLN
INTRAMUSCULAR | Status: DC | PRN
  Administered 2022-02-10: 2 [IU] via SUBCUTANEOUS

## 2022-02-10 MED ORDER — INSULIN ASPART 100 UNIT/ML IJ SOLN
0.0000 [IU] | INTRAMUSCULAR | Status: DC
  Administered 2022-02-10: 3 [IU] via SUBCUTANEOUS
  Administered 2022-02-11 – 2022-02-13 (×9): 2 [IU] via SUBCUTANEOUS
  Administered 2022-02-13: 3 [IU] via SUBCUTANEOUS
  Administered 2022-02-13 (×4): 2 [IU] via SUBCUTANEOUS
  Administered 2022-02-14 (×2): 3 [IU] via SUBCUTANEOUS
  Administered 2022-02-14: 5 [IU] via SUBCUTANEOUS
  Administered 2022-02-14: 3 [IU] via SUBCUTANEOUS
  Administered 2022-02-14 (×2): 5 [IU] via SUBCUTANEOUS
  Administered 2022-02-14: 3 [IU] via SUBCUTANEOUS
  Administered 2022-02-15 (×3): 5 [IU] via SUBCUTANEOUS
  Administered 2022-02-15: 3 [IU] via SUBCUTANEOUS
  Administered 2022-02-15: 5 [IU] via SUBCUTANEOUS
  Administered 2022-02-16: 3 [IU] via SUBCUTANEOUS
  Administered 2022-02-16: 5 [IU] via SUBCUTANEOUS
  Administered 2022-02-16: 3 [IU] via SUBCUTANEOUS
  Administered 2022-02-16 (×2): 5 [IU] via SUBCUTANEOUS
  Administered 2022-02-16 – 2022-02-17 (×7): 3 [IU] via SUBCUTANEOUS
  Administered 2022-02-18: 5 [IU] via SUBCUTANEOUS
  Administered 2022-02-18: 3 [IU] via SUBCUTANEOUS
  Administered 2022-02-18 (×3): 2 [IU] via SUBCUTANEOUS
  Administered 2022-02-18 – 2022-02-19 (×4): 3 [IU] via SUBCUTANEOUS

## 2022-02-10 MED ORDER — ACETAMINOPHEN 325 MG PO TABS: 650.0000 mg | ORAL_TABLET | Freq: Four times a day (QID) | ORAL | Status: DC | PRN

## 2022-02-10 MED ORDER — MEPERIDINE HCL 25 MG/ML IJ SOLN: 6.2500 mg | INTRAMUSCULAR | Status: DC | PRN

## 2022-02-10 MED ORDER — LACTATED RINGERS IV SOLN: INTRAVENOUS | Status: DC

## 2022-02-10 MED ORDER — HYDROMORPHONE HCL 1 MG/ML IJ SOLN
INTRAMUSCULAR | Status: DC | PRN
  Administered 2022-02-10: .5 mg via INTRAVENOUS

## 2022-02-10 MED ORDER — ENOXAPARIN SODIUM 40 MG/0.4ML IJ SOSY: 40.0000 mg | PREFILLED_SYRINGE | INTRAMUSCULAR | Status: DC

## 2022-02-10 MED ORDER — SUCCINYLCHOLINE CHLORIDE 200 MG/10ML IV SOSY
PREFILLED_SYRINGE | INTRAVENOUS | Status: DC | PRN
  Administered 2022-02-10: 100 mg via INTRAVENOUS

## 2022-02-10 MED ORDER — HYDROMORPHONE HCL 1 MG/ML IJ SOLN
INTRAMUSCULAR | Status: AC
  Filled 2022-02-10: qty 1

## 2022-02-10 MED ORDER — ACETAMINOPHEN 10 MG/ML IV SOLN
1000.0000 mg | Freq: Four times a day (QID) | INTRAVENOUS | Status: DC
  Administered 2022-02-10: 1000 mg via INTRAVENOUS

## 2022-02-10 MED ORDER — MIDAZOLAM HCL 2 MG/2ML IJ SOLN
INTRAMUSCULAR | Status: DC | PRN
  Administered 2022-02-10 (×2): 1 mg via INTRAVENOUS
  Administered 2022-02-10: 2 mg via INTRAVENOUS

## 2022-02-10 MED ORDER — ENOXAPARIN SODIUM 40 MG/0.4ML IJ SOSY
40.0000 mg | PREFILLED_SYRINGE | INTRAMUSCULAR | Status: DC
  Administered 2022-02-11 – 2022-02-13 (×3): 40 mg via SUBCUTANEOUS
  Filled 2022-02-10 (×3): qty 0.4

## 2022-02-10 MED ORDER — PHENYLEPHRINE HCL-NACL 20-0.9 MG/250ML-% IV SOLN
INTRAVENOUS | Status: DC | PRN
  Administered 2022-02-10: 50 ug/min via INTRAVENOUS

## 2022-02-10 MED ORDER — LACTATED RINGERS IV BOLUS
1000.0000 mL | Freq: Once | INTRAVENOUS | Status: AC
  Administered 2022-02-10: 1000 mL via INTRAVENOUS

## 2022-02-10 MED ORDER — ROCURONIUM BROMIDE 10 MG/ML (PF) SYRINGE
PREFILLED_SYRINGE | INTRAVENOUS | Status: DC | PRN
  Administered 2022-02-10 (×3): 50 mg via INTRAVENOUS

## 2022-02-10 MED ORDER — PHENYLEPHRINE 80 MCG/ML (10ML) SYRINGE FOR IV PUSH (FOR BLOOD PRESSURE SUPPORT)
PREFILLED_SYRINGE | INTRAVENOUS | Status: DC | PRN
  Administered 2022-02-10 (×2): 80 ug via INTRAVENOUS

## 2022-02-10 MED ORDER — FENTANYL CITRATE (PF) 250 MCG/5ML IJ SOLN
INTRAMUSCULAR | Status: AC
  Filled 2022-02-10: qty 5

## 2022-02-10 MED ORDER — HYDROMORPHONE HCL 1 MG/ML IJ SOLN
INTRAMUSCULAR | Status: AC
  Filled 2022-02-10: qty 0.5

## 2022-02-10 MED ORDER — ONDANSETRON HCL 4 MG/2ML IJ SOLN
INTRAMUSCULAR | Status: DC | PRN
  Administered 2022-02-10: 4 mg via INTRAVENOUS

## 2022-02-10 MED ORDER — SUGAMMADEX SODIUM 200 MG/2ML IV SOLN
INTRAVENOUS | Status: DC | PRN
  Administered 2022-02-10: 230 mg via INTRAVENOUS

## 2022-02-10 MED ORDER — AMISULPRIDE (ANTIEMETIC) 5 MG/2ML IV SOLN: 10.0000 mg | Freq: Once | INTRAVENOUS | Status: DC | PRN

## 2022-02-10 MED ORDER — SODIUM CHLORIDE 0.9 % IV SOLN
10.0000 mL/h | Freq: Once | INTRAVENOUS | Status: AC
  Administered 2022-02-19: 10 mL/h via INTRAVENOUS

## 2022-02-10 MED ORDER — HYDROMORPHONE HCL 1 MG/ML IJ SOLN
1.0000 mg | INTRAMUSCULAR | Status: DC | PRN
  Administered 2022-02-10 – 2022-02-12 (×10): 1 mg via INTRAVENOUS
  Administered 2022-02-12 – 2022-02-14 (×11): 2 mg via INTRAVENOUS
  Administered 2022-02-14: 1 mg via INTRAVENOUS
  Administered 2022-02-14 – 2022-02-15 (×6): 2 mg via INTRAVENOUS
  Administered 2022-02-15: 1 mg via INTRAVENOUS
  Administered 2022-02-15 – 2022-02-17 (×11): 2 mg via INTRAVENOUS
  Administered 2022-02-17: 1 mg via INTRAVENOUS
  Administered 2022-02-17 (×3): 2 mg via INTRAVENOUS
  Administered 2022-02-18: 1 mg via INTRAVENOUS
  Administered 2022-02-18 (×3): 2 mg via INTRAVENOUS
  Administered 2022-02-18: 1 mg via INTRAVENOUS
  Administered 2022-02-19: 2 mg via INTRAVENOUS
  Administered 2022-02-19 (×3): 1 mg via INTRAVENOUS
  Administered 2022-02-21: 2 mg via INTRAVENOUS
  Administered 2022-02-21 (×2): 1 mg via INTRAVENOUS
  Administered 2022-02-21: 2 mg via INTRAVENOUS
  Administered 2022-02-21: 1 mg via INTRAVENOUS
  Administered 2022-02-21 – 2022-02-22 (×2): 2 mg via INTRAVENOUS
  Administered 2022-02-22: 1 mg via INTRAVENOUS
  Administered 2022-02-22: 2 mg via INTRAVENOUS
  Administered 2022-02-22 – 2022-02-23 (×4): 1 mg via INTRAVENOUS
  Administered 2022-02-23 (×2): 2 mg via INTRAVENOUS
  Administered 2022-02-24 (×3): 1 mg via INTRAVENOUS
  Filled 2022-02-10 (×2): qty 2
  Filled 2022-02-10: qty 1
  Filled 2022-02-10 (×2): qty 2
  Filled 2022-02-10 (×3): qty 1
  Filled 2022-02-10 (×2): qty 2
  Filled 2022-02-10 (×2): qty 1
  Filled 2022-02-10 (×2): qty 2
  Filled 2022-02-10 (×2): qty 1
  Filled 2022-02-10 (×3): qty 2
  Filled 2022-02-10 (×2): qty 1
  Filled 2022-02-10: qty 2
  Filled 2022-02-10: qty 1
  Filled 2022-02-10: qty 2
  Filled 2022-02-10 (×2): qty 1
  Filled 2022-02-10 (×2): qty 2
  Filled 2022-02-10 (×2): qty 1
  Filled 2022-02-10 (×6): qty 2
  Filled 2022-02-10: qty 1
  Filled 2022-02-10 (×2): qty 2
  Filled 2022-02-10 (×3): qty 1
  Filled 2022-02-10 (×2): qty 2
  Filled 2022-02-10: qty 1
  Filled 2022-02-10 (×2): qty 2
  Filled 2022-02-10: qty 1
  Filled 2022-02-10 (×2): qty 2
  Filled 2022-02-10 (×2): qty 1
  Filled 2022-02-10: qty 2
  Filled 2022-02-10 (×2): qty 1
  Filled 2022-02-10: qty 2
  Filled 2022-02-10 (×2): qty 1
  Filled 2022-02-10 (×3): qty 2
  Filled 2022-02-10: qty 1
  Filled 2022-02-10: qty 2
  Filled 2022-02-10: qty 1
  Filled 2022-02-10 (×9): qty 2

## 2022-02-10 MED ORDER — DEXAMETHASONE SODIUM PHOSPHATE 10 MG/ML IJ SOLN
INTRAMUSCULAR | Status: DC | PRN
  Administered 2022-02-10: 5 mg via INTRAVENOUS

## 2022-02-10 MED ORDER — ALBUMIN HUMAN 5 % IV SOLN
25.0000 g | Freq: Once | INTRAVENOUS | Status: AC
  Administered 2022-02-10: 25 g via INTRAVENOUS
  Filled 2022-02-10: qty 500

## 2022-02-10 MED ORDER — PIPERACILLIN-TAZOBACTAM 3.375 G IVPB 30 MIN
3.3750 g | Freq: Once | INTRAVENOUS | Status: AC
  Administered 2022-02-10: 3.375 g via INTRAVENOUS
  Filled 2022-02-10: qty 50

## 2022-02-10 MED ORDER — HYDROMORPHONE HCL 1 MG/ML IJ SOLN
0.2500 mg | INTRAMUSCULAR | Status: DC | PRN
  Administered 2022-02-10 (×3): 0.5 mg via INTRAVENOUS

## 2022-02-10 MED ORDER — ORAL CARE MOUTH RINSE: 15.0000 mL | Freq: Once | OROMUCOSAL | Status: AC

## 2022-02-10 MED ORDER — CHLORHEXIDINE GLUCONATE 0.12 % MT SOLN
15.0000 mL | Freq: Once | OROMUCOSAL | Status: AC
  Administered 2022-02-10: 15 mL via OROMUCOSAL
  Filled 2022-02-10 (×2): qty 15

## 2022-02-10 MED ORDER — ALBUMIN HUMAN 5 % IV SOLN: INTRAVENOUS | Status: DC | PRN

## 2022-02-10 MED ORDER — CHLORHEXIDINE GLUCONATE CLOTH 2 % EX PADS
6.0000 | MEDICATED_PAD | Freq: Every day | CUTANEOUS | Status: DC
  Administered 2022-02-10 – 2022-03-01 (×19): 6 via TOPICAL

## 2022-02-10 MED ORDER — PROMETHAZINE HCL 25 MG/ML IJ SOLN: 6.2500 mg | INTRAMUSCULAR | Status: DC | PRN

## 2022-02-10 MED ORDER — ACETAMINOPHEN 10 MG/ML IV SOLN
INTRAVENOUS | Status: AC
  Filled 2022-02-10: qty 100

## 2022-02-10 MED ORDER — FENTANYL CITRATE (PF) 250 MCG/5ML IJ SOLN
INTRAMUSCULAR | Status: DC | PRN
  Administered 2022-02-10 (×2): 50 ug via INTRAVENOUS
  Administered 2022-02-10: 100 ug via INTRAVENOUS
  Administered 2022-02-10: 50 ug via INTRAVENOUS
  Administered 2022-02-10: 100 ug via INTRAVENOUS

## 2022-02-10 SURGICAL SUPPLY — 54 items
BAG COUNTER SPONGE SURGICOUNT (BAG) ×2 IMPLANT
BLADE CLIPPER SURG (BLADE) IMPLANT
CANISTER SUCT 3000ML PPV (MISCELLANEOUS) ×2 IMPLANT
CHLORAPREP W/TINT 26 (MISCELLANEOUS) ×2 IMPLANT
COVER SURGICAL LIGHT HANDLE (MISCELLANEOUS) ×2 IMPLANT
DRAIN CHANNEL 19F RND (DRAIN) IMPLANT
DRAPE LAPAROSCOPIC ABDOMINAL (DRAPES) ×2 IMPLANT
DRAPE WARM FLUID 44X44 (DRAPES) ×2 IMPLANT
DRSG OPSITE POSTOP 4X10 (GAUZE/BANDAGES/DRESSINGS) IMPLANT
DRSG OPSITE POSTOP 4X8 (GAUZE/BANDAGES/DRESSINGS) IMPLANT
ELECT BLADE 6.5 EXT (BLADE) IMPLANT
ELECT CAUTERY BLADE 6.4 (BLADE) ×2 IMPLANT
ELECT REM PT RETURN 9FT ADLT (ELECTROSURGICAL) ×2
ELECTRODE REM PT RTRN 9FT ADLT (ELECTROSURGICAL) ×2 IMPLANT
EVACUATOR SILICONE 100CC (DRAIN) IMPLANT
GLOVE BIO SURGEON STRL SZ8 (GLOVE) ×2 IMPLANT
GLOVE BIOGEL PI IND STRL 8 (GLOVE) ×2 IMPLANT
GOWN STRL REUS W/ TWL LRG LVL3 (GOWN DISPOSABLE) ×2 IMPLANT
GOWN STRL REUS W/ TWL XL LVL3 (GOWN DISPOSABLE) ×2 IMPLANT
GOWN STRL REUS W/TWL LRG LVL3 (GOWN DISPOSABLE) ×2
GOWN STRL REUS W/TWL XL LVL3 (GOWN DISPOSABLE) ×2
HANDLE SUCTION POOLE (INSTRUMENTS) ×2 IMPLANT
HEMOSTAT ARISTA ABSORB 3G PWDR (HEMOSTASIS) IMPLANT
KIT BASIN OR (CUSTOM PROCEDURE TRAY) ×2 IMPLANT
KIT OSTOMY DRAINABLE 2.75 STR (WOUND CARE) IMPLANT
KIT TURNOVER KIT B (KITS) ×2 IMPLANT
LIGASURE IMPACT 36 18CM CVD LR (INSTRUMENTS) IMPLANT
NS IRRIG 1000ML POUR BTL (IV SOLUTION) ×4 IMPLANT
PACK GENERAL/GYN (CUSTOM PROCEDURE TRAY) ×2 IMPLANT
PAD ARMBOARD 7.5X6 YLW CONV (MISCELLANEOUS) ×2 IMPLANT
PENCIL SMOKE EVACUATOR (MISCELLANEOUS) ×2 IMPLANT
RELOAD PROXIMATE 75MM BLUE (ENDOMECHANICALS) ×4 IMPLANT
RELOAD PROXIMATE 75MM GREEN (ENDOMECHANICALS) ×4 IMPLANT
RELOAD STAPLE 75 3.8 BLU REG (ENDOMECHANICALS) IMPLANT
RELOAD STAPLE 75 4.5 GRN THCK (ENDOMECHANICALS) IMPLANT
RELOAD STAPLER LINE PROX 60 GR (STAPLE) ×2 IMPLANT
SPECIMEN JAR LARGE (MISCELLANEOUS) IMPLANT
SPONGE T-LAP 18X18 ~~LOC~~+RFID (SPONGE) IMPLANT
STAPLER PROXIMATE 75MM BLUE (STAPLE) IMPLANT
STAPLER RELOAD LINE PROX 60 GR (STAPLE) ×2
STAPLER RELOADABLE 60 GRN THCK (STAPLE) IMPLANT
STAPLER VISISTAT 35W (STAPLE) ×2 IMPLANT
SUCTION POOLE HANDLE (INSTRUMENTS) ×2
SUT ETHILON 2 0 FS 18 (SUTURE) IMPLANT
SUT PDS AB 1 TP1 96 (SUTURE) ×4 IMPLANT
SUT VIC AB 2-0 SH 18 (SUTURE) ×2 IMPLANT
SUT VIC AB 3-0 SH 18 (SUTURE) ×2 IMPLANT
SUT VIC AB 3-0 SH 8-18 (SUTURE) IMPLANT
SUT VICRYL AB 2 0 TIES (SUTURE) ×2 IMPLANT
SUT VICRYL AB 3 0 TIES (SUTURE) ×2 IMPLANT
TOWEL GREEN STERILE (TOWEL DISPOSABLE) ×2 IMPLANT
TOWEL GREEN STERILE FF (TOWEL DISPOSABLE) ×2 IMPLANT
TRAY FOLEY MTR SLVR 16FR STAT (SET/KITS/TRAYS/PACK) IMPLANT
YANKAUER SUCT BULB TIP NO VENT (SUCTIONS) IMPLANT

## 2022-02-10 NOTE — ED Notes (Signed)
Patient being transported to short stay bay 33

## 2022-02-10 NOTE — Anesthesia Procedure Notes (Signed)
Procedure Name: Intubation Date/Time: 02/15/2022 10:27 AM  Performed by: Mariea Clonts, CRNAPre-anesthesia Checklist: Patient identified, Emergency Drugs available, Suction available and Patient being monitored Patient Re-evaluated:Patient Re-evaluated prior to induction Oxygen Delivery Method: Circle System Utilized Preoxygenation: Pre-oxygenation with 100% oxygen Induction Type: IV induction, Rapid sequence and Cricoid Pressure applied Laryngoscope Size: Miller and 2 Grade View: Grade I Tube type: Oral Tube size: 8.0 mm Number of attempts: 1 Airway Equipment and Method: Stylet and Oral airway Placement Confirmation: ETT inserted through vocal cords under direct vision, positive ETCO2 and breath sounds checked- equal and bilateral Tube secured with: Tape Dental Injury: Teeth and Oropharynx as per pre-operative assessment

## 2022-02-10 NOTE — Interval H&P Note (Signed)
History and Physical Interval Note:  01/20/2022 10:03 AM  Janyce Llanos  has presented today for surgery, with the diagnosis of Bowel Obstruction.  The various methods of treatment have been discussed with the patient and family. After consideration of risks, benefits and other options for treatment, the patient has consented to  Procedure(s): EXPLORATORY LAPAROTOMY POSSIBLE BOWEL RESECTION; POSSIBLE COLOSTOMY (N/A) as a surgical intervention.  The patient's history has been reviewed, patient examined, no change in status, stable for surgery.  I have reviewed the patient's chart and labs.  Questions were answered to the patient's satisfaction.    Patient seen, examined and agree  CT scan reviewed  52 year old male with progressive anorexia weight loss abdominal mass.  Seen last night with a white count of 17,000.  Concern was for perforation.  This appears to be a large intra-abdominal mass measuring almost 15 cm at the level of the transverse colon splenic flexure.  This may represent abscess with perforation, tumor or combination of both.  Discussed operative intervention to exclude exporter laparotomy with attempted resection.  Explained this may not even be resectable depending on anatomical findings.  On CT scan this does extend down to the pancreas and abuts the splenic vein.  Unclear if resectability is possible but I did mention to him that as a possibility.  He may require fecal diversion as well with colostomy.  He may require small bowel resection and/or resection of part of the stomach depending on findings.  Given that he has a white count of 70,000, abdominal pain and is ill at this point in time, recommend exploratory laparotomy for the above reasons.  This was explained in detail to his wife at the bedside they both voiced understanding and agreed to proceed.    The procedure has been discussed with the patient.  Alternative therapies have been discussed with the patient.  Operative  risks include bleeding,  Infection,  Organ injury,  Nerve injury,  Blood vessel injury,  DVT,  Pulmonary embolism,  Death,  And possible reoperation.  Medical management risks include worsening of present situation.  The success of the procedure is 50 -90 % at treating patients symptoms.  The patient understands and agrees to proceed.  [place]

## 2022-02-10 NOTE — Transfer of Care (Signed)
Immediate Anesthesia Transfer of Care Note  Patient: Patrick [name]  Procedure(s) Performed: EXPLORATORY LAPAROTOMY WITH BOWEL RESECTIOM APPLICATION OF WOUND VAC  Patient Location: PACU  Anesthesia Type:General  Level of Consciousness: awake, alert , and oriented  Airway & Oxygen Therapy: Patient Spontanous Breathing and Patient connected to nasal cannula oxygen  Post-op Assessment: Report given to RN and Post -op Vital signs reviewed and stable  Post vital signs: Reviewed and stable  Last Vitals:  Vitals Value Taken Time  BP 136/73 02/06/2022 1400  Temp    Pulse 97 02/08/2022 1401  Resp 14 01/24/2022 1401  SpO2 95 % 02/01/2022 1401  Vitals shown include unvalidated device data.  Last Pain:  Vitals:   02/01/2022 0930  TempSrc:   PainSc: 0-No pain         Complications: No notable events documented.

## 2022-02-10 NOTE — Op Note (Signed)
Preoperative diagnosis: Intra-abdominal mass with possible perforation, abscess or tumor measuring 15 cm  Postoperative diagnosis: 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, proximal small bowel with ischemic necrotic proximal jejunum with perforation fecal contamination with abscess  Procedure: Exploratory laparotomy with resection of transverse colon, drainage of mesenteric abscess, resection of proximal jejunum with small bowel anastomosis and 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  Surgeon: Erroll Luna, MD  Assistant: Barkley Boards, PA  Anesthesia: General  EBL: 250 cc  IV fluids 3 L crystalloid, 500 of albumin and 1 unit of blood  Drains: 19 round  Indications for procedure: The patient is a 52 year old male who was admitted last time to the emergency room due to abdominal pain x 4 weeks, weight loss and failure to thrive.  He had a CT scan done at [facility] by his outpatient PCP obtained which showed large L mass with concern for perforation measuring 15 cm just adjacent to the transverse colon regarding the mesentery of the jejunum as well as pancreas.  He was seen here and evaluated by the additional CT scan without contrast since the initial could not be viewed easily which showed a large necrotic mass without signs of perforation or free air.  He was quite dehydrated and ill.  He was resuscitated.  After reviewing the CT scan I recommended exploration today since concerns for ongoing perforation of this mass.The procedure has been discussed with the patient.  Alternative therapies have been discussed with the patient.  Operative risks include bleeding,  Infection,  Organ injury,  Nerve injury,  Blood vessel injury,  DVT, ostomy formation, pulmonary embolism,  Death,  And possible reoperation.  Medical management risks include worsening of present situation.  The success of the procedure is 50 -90 % at treating patients symptoms.  The patient understands and  agrees to proceed  Description of procedure: The patient was met in the holding area and questions were answered.  The procedure was reviewed.  Taken back to the operative room and placed supine upon the operating room table.  After induction of general esthesia Foley catheter was placed as well as an A-line.  The abdomen was prepped and draped in sterile fashion.  Timeout performed.  Midline incision was used from the xiphoid to the umbilicus.  Dissection was carried down to the linea alba this was opened in the midline.  The abdominal cavity entered.  A retractor was placed.  There is a large "ball sized mass involving what appeared to be the mesentery of the jejunum.  The transverse colon was adherent to his and there is evidence of necrotic dead small bowel around this which corresponded to the proximal jejunum.  Once retractors were placed I was able to mobilize some of the small bowel in the retroperitoneum appeared to feel better.  I ran the small bowel from this there was about 150 cm of small bowel all the way to the cecum..  Of note the ascending colon was viable as was the descending colon sigmoid colon and rectum.  Transverse colon was involved in this process.  It was difficult to tell if this was the primary source of this appears to be a perforated cancer either from the small bowel, transverse colon possibly mesentery.  There is necrotic.  Given the fact that he had perforated necrotic small bowel, I felt that a damage control resection was necessary to control sepsis.  This was not good to be resectable upon evaluation.  Small bowel was divided  just distal to the process.  I then ran the small bowel through the necrotic segment up to what appeared to be the segment headed to the ligament of Treitz.  There was probably about 30 cm between the edge of this process to the ligament of Treitz but the mesentery was quite contracted.  Transverse colon also was a part of this.  I transected it proximal to  the process which is the proximal transverse colon and distal to it.  Transverse colon perforated this mass that extended into the mesentery.  This appeared to be all tumor.  I then resected the transverse colon with the LigaSure and oversewed it much of the visit.  It was thick in nature with 2-0 and 3-0 Vicryl.  This MD perforated this large abscess cavity.  There is fecal peritonitis noted.  This was removed.  The omentum around this was also transected using the LigaSure and passed off the field.  Small bowel was then resected but this was also part of the process of the mesentery.  It was quite thickened days LigaSure and suture ligatures to control the thickened tumor filled mesentery.  This was removed.  We then created a side-to-side functional end anastomosis of the distal jejunum to the proximal jejunum using a GIA 75 stapler and a TX 60 to close the common neurotomy.  There is no undue tension.  There is a large mesenteric defect but this was not close to the to the tumor invading much of this defect and would not close.  3 L of irrigation was used.  Of note he did have gallstones but no signs of cholecystitis.  There is no evidence of liver metastasis.  There is no evidence of peritoneal metastasis.  NG tube was positioned in the stomach.  A right upper quadrant colostomy was pulled through the standard fashion.  We then closed the fascia with double-stranded PDS.  A 19 round drain was placed prior to this the right lower quadrant.  We then placed a wound VAC on the open skin.  Ventriculostomy with 2-0 Vicryl.  Ostomy appliance applied.  J P was placed to bulb suction.  All counts were found to be correct.  Patient was taken to the ICU intubated in critical but stable condition.   CASE DATA:  Type of patient?: DOW CASE (Surgical Hospitalist [facility])  Status of Case? URGENT Add On  Infection Present At Time Of Surgery (PATOS)?  FECULENT PERITONITIS

## 2022-02-10 NOTE — Consult Note (Signed)
[place] Nurse requested for preoperative stoma site marking  Discussed surgical procedure and stoma creation with patient and family.  Explained role of the [place] nurse team.  Answered patient and family questions.   Examined patient lying, sitting, and standing in order to place the marking in the patient's visual field, away from any creases or abdominal contour issues and within the rectus muscle.  Attempted to mark below the patient's belt line. Patient has large pannus that hangs when standing and sitting, initial marks were well below umbilicus however patient is not able to see these marks when he is sitting or standing.   Marked for colostomy in the LLQ  _7___ cm to the left of the umbilicus and __8__AS below the umbilicus.  Marked for ileostomy in the RLQ  5____cm to the right of the umbilicus and  _6___ cm below the umbilicus.   Patient's abdomen cleansed with CHG wipes at site markings, allowed to air dry prior to marking.Covered mark with thin film transparent dressing to preserve mark until date of surgery.   [place] Nurse team will follow up with patient after surgery for continue ostomy care and teaching.  [place] MSN, [place], [place], [place]

## 2022-02-10 NOTE — Progress Notes (Signed)
[place] Progress Note Patient Name: CORLISS LAMARTINA DOB: 21-May-1970 MRN: [id number]   Date of Service  01/29/2022  HPI/Events of Note  Post-op soft blood pressure following ex-lap and bowel resection for bowel perforation with peritonitis, patient is also oliguric.  eICU Interventions  Albumin 5 % 500 ml iv fluid bolus x 1 ordered.        Kerry Kass Jeneva Schweizer 02/15/2022, 8:15 PM

## 2022-02-10 NOTE — Anesthesia Preprocedure Evaluation (Addendum)
Anesthesia Evaluation  Patient identified by MRN, date of birth, ID band Patient awake    Reviewed: Allergy & Precautions, NPO status , Patient's Chart, lab work & pertinent test results  History of Anesthesia Complications (+) PONV and history of anesthetic complications  Airway Mallampati: IV  TM Distance: >3 FB Neck ROM: Full    Dental  (+) Dental Advisory Given, Missing, Poor Dentition   Pulmonary neg pulmonary ROS   Pulmonary exam normal breath sounds clear to auscultation       Cardiovascular negative cardio ROS  Rhythm:Regular Rate:Normal     Neuro/Psych  PSYCHIATRIC DISORDERS Anxiety     negative neurological ROS     GI/Hepatic negative GI ROS, Neg liver ROS,,,  Endo/Other  diabetes    Renal/GU negative Renal ROS     Musculoskeletal  (+) Arthritis ,    Abdominal  (+) + obese  Peds  Hematology negative hematology ROS (+)   Anesthesia Other Findings   Reproductive/Obstetrics                             Anesthesia Physical Anesthesia Plan  ASA: 3 and emergent  Anesthesia Plan: General   Post-op Pain Management: Tylenol PO (pre-op)* and Gabapentin PO (pre-op)*   Induction: Intravenous, Cricoid pressure planned and Rapid sequence  PONV Risk Score and Plan: 4 or greater and Ondansetron, Dexamethasone, Treatment may vary due to age or medical condition and Midazolam  Airway Management Planned: Oral ETT and Video Laryngoscope Planned  Additional Equipment: Arterial line  Intra-op Plan:   Post-operative Plan: Possible Post-op intubation/ventilation  Informed Consent: I have reviewed the patients History and Physical, chart, labs and discussed the procedure including the risks, benefits and alternatives for the proposed anesthesia with the patient or authorized representative who has indicated his/her understanding and acceptance.     Dental advisory given  Plan Discussed  with: CRNA  Anesthesia Plan Comments: (2 x PIV, )        Anesthesia Quick Evaluation

## 2022-02-10 NOTE — Consult Note (Signed)
NAME:  Patrick [name], MRN:  [id number], DOB:  04-Jan-1971, LOS: 1 ADMISSION DATE:  01/25/2022, CONSULTATION DATE:  02/04/2022 REFERRING MD:  Edison Pace, CHIEF COMPLAINT:  Post op Hypotension    History of Present Illness:  Patrick [name]. Fly is a 52 y.o. 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 Of note, he was seen by his PCP for severe abdominal pain x4 weeks that has increasingly progressed.   Patient was seen and evaluated by surgical team in which they believe on exam CT that he did not have free perforation/pneumoperitoneum/or peritoneal signs throughout the abdomen, however, did show an intra-abdominal mass measuring almost 15 cm at the level of the transverse colon splenic flexure.  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 IVF resuscitation and antibiotics started.   On 02/15/2022, patient was taken to the OR for Exploratory laparotomy with resection of transverse colon, drainage of mesenteric abscess, resection of proximal jejunum with small bowel anastomosis and 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. Post operatively patient developed hypotension requiring vasopressors.   PCCM consulted for post operative hypotension management.   Pertinent  Medical History   Past Medical History:  Diagnosis Date   Anxiety    Arthritis    Chronic osteomyelitis of left foot ([place]) 12/20/2018   Chronic osteomyelitis of right foot ([place]) 12/20/2018   Diabetes mellitus without complication ([place])    Diabetic foot infection ([place]) 12/20/2018   Diabetic neuropathy ([place]) 12/20/2018   History of kidney stones    Hx of CABG with Exclusion of left atrial appendage  2022   Morbid obesity ([place]) 12/20/2018   MSSA (methicillin susceptible Staphylococcus aureus) infection 12/20/2018   PONV (postoperative nausea and vomiting)     Significant Hospital Events: Including procedures, antibiotic start and stop dates in addition to other pertinent events   02/14/2022 - Admitted, IVF resuscitation and antibiotics 02/12/2022 - Exploratory Lap Surg  Interim History / Subjective:  Pt is resting in the bed, complaining of minor abdominal pain. Complaining that he is hot.   Objective   Blood pressure 90/62, pulse 95, temperature 98 F (36.7 C), temperature source Oral, resp. rate 12, height 6' (1.829 m), weight 113.4 kg, SpO2 98 %.        Intake/Output Summary (Last 24 hours) at 02/13/2022 1542 Last data filed at 02/01/2022 1534 Gross per 24 hour  Intake 6729.17 ml  Output 1160 ml  Net 5569.17 ml   Filed Weights   02/10/2022 1656  Weight: 113.4 kg   Physical Examination: General: Acutely ill-appearing middle aged in NAD. HEENT: [place]/AT, anicteric sclera, PERRL, moist mucous membranes. Neuro: Awake, oriented x 4. Responds to verbal stimuli. Following commands consistently. Moves all 4 extremities spontaneously.  CV: RRR, no m/g/r. PULM: Breathing even and unlabored on [place]. Lung fields CTAB. GI: Soft, tender, nondistended. ypoacitve bowel sounds. NGT to LIWS, Wound Vac applied to middle-line incision of abdomen. Stoma site to RQ. Extremities:  LE edema noted. Skin: Warm/dry, no rashes, pale, diaphoretic   Resolved Hospital Problem list     Assessment & Plan:  Post Operative Hypotension Microcytic Anemia  > Likely medication induced. On my arrival, patient was not on any vasopressors and MAP is >65.  Plan - Continue IVF  - Ordering IVF bolus for now  - MAP Goal >65 - Monitor urinary output - Trend H&H - Monitor for signs of active bleeding - Transfuse for Hgb < 7.0 or hemodynamically significant bleeding  Hyperglycemia Plan - SSI - CBGs Q4H - Goal  CBG 140-180  Best Practice (right click and "Reselect all SmartList Selections" daily)   Diet/type: NPO DVT prophylaxis: LMWH GI prophylaxis: N/A Lines: N/A Foley:  Yes, and it is still needed Code Status:  full code Last date of multidisciplinary goals of care discussion '[]'$   Labs   CBC: Recent Labs  Lab 01/29/2022 1710 02/02/2022 0002 01/31/2022 0535  WBC 19.7* 17.3* 17.7*  NEUTROABS 16.6*  --   --   HGB 11.3* 9.5* 9.2*  HCT 38.4* 31.6* 29.4*  MCV 77.9* 76.5* 76.4*  PLT 606* 490* 450*    Basic Metabolic Panel: Recent Labs  Lab 01/20/2022 1710 01/23/2022 0002 02/02/2022 0535  NA 133*  --  134*  K 3.9  --  4.0  CL 97*  --  103  CO2 22  --  21*  GLUCOSE 275*  --  144*  BUN 19  --  19  CREATININE 1.57* 1.49* 1.40*  CALCIUM 8.5*  --  7.6*   GFR: Estimated Creatinine Clearance: 82.1 mL/min (A) (by C-G formula based on SCr of 1.4 mg/dL (H)). Recent Labs  Lab 01/26/2022 1710 02/11/2022 1914 01/22/2022 0002 01/26/2022 0535  WBC 19.7*  --  17.3* 17.7*  LATICACIDVEN 1.7 1.5  --   --     Liver Function Tests: Recent Labs  Lab 02/07/2022 1710  AST 24  ALT 17  ALKPHOS 93  BILITOT 1.5*  PROT 7.0  ALBUMIN 2.6*   Recent Labs  Lab 02/02/2022 1710  LIPASE 26   No results for input(s): "AMMONIA" in the last 168 hours.  ABG No results found for: "PHART", "PCO2ART", "PO2ART", "HCO3", "TCO2", "ACIDBASEDEF", "O2SAT"   Coagulation Profile: No results for input(s): "INR", "PROTIME" in the last 168 hours.  Cardiac Enzymes: No results for input(s): "CKTOTAL", "CKMB", "CKMBINDEX", "TROPONINI" in the last 168 hours.  HbA1C: Hgb A1c MFr Bld  Date/Time Value Ref Range Status  10/20/2018 10:49 AM 6.6 (H) 4.8 - 5.6 % Final    Comment:    (NOTE) Pre diabetes:          5.7%-6.4% Diabetes:              >6.4% Glycemic control for   <7.0% adults with diabetes   05/05/2018 12:51 PM 7.3 (H) 4.8 - 5.6 % Final    Comment:    (NOTE) Pre diabetes:          5.7%-6.4% Diabetes:               >6.4% Glycemic control for   <7.0% adults with diabetes     CBG: Recent Labs  Lab 01/30/2022 0915 01/21/2022 1208 02/03/2022 1356  GLUCAP 151* 163* 154*    Review of Systems:   Review of Systems  Constitutional:  Positive for diaphoresis. Negative for chills and fever.  Respiratory: Negative.    Cardiovascular:  Negative for chest pain and palpitations.  Gastrointestinal:  Positive for abdominal pain.  Neurological: Negative.    Past Medical History:  He,  has a past medical history of Anxiety, Arthritis, Chronic osteomyelitis of left foot ([place]) (12/20/2018), Chronic osteomyelitis of right foot ([place]) (12/20/2018), Diabetes mellitus without complication ([place]), Diabetic foot infection ([place]) (12/20/2018), Diabetic neuropathy ([place]) (12/20/2018), History of  kidney stones, Morbid obesity ([place]) (12/20/2018), MSSA (methicillin susceptible Staphylococcus aureus) infection (12/20/2018), and PONV (postoperative nausea and vomiting).   Surgical History:   Past Surgical History:  Procedure Laterality Date   AMPUTATION Right 05/10/2018   Procedure: PARTIAL 5TH RAY AMPUTATION RIGHT FOOT;  Surgeon: Caprice Beaver, DPM;  Location: [facility];  Service: Podiatry;  Laterality: Right;   AMPUTATION Left 10/26/2018   Procedure: PARTIAL 1ST RAY AMPUTATION FOOT;  Surgeon: Caprice Beaver, DPM;  Location: [facility];  Service: Podiatry;  Laterality: Left;   APPLICATION OF WOUND VAC Right 05/10/2018   Procedure: APPLICATION OF WOUND VAC RIGHT FOOT;  Surgeon: Caprice Beaver, DPM;  Location: [facility];  Service: Podiatry;  Laterality: Right;   CORONARY ARTERY BYPASS GRAFT  04/01/2020   CABG x 5 (LIMA-LAD, RCA-PDA, VG-Diag, VG-OM, SVG-RPL) and LAA Clip at [facility] in [place] with Sande Brothers, MD   [place] Right 05/10/2018   Procedure: DEBRIDEMENT PUNCTURE WOUND;  Surgeon: Caprice Beaver, DPM;  Location: [facility];  Service: Podiatry;  Laterality: Right;   WOUND DEBRIDEMENT Left  05/10/2018   Procedure: DEBRIDEMENT ULCERATION LEFT FOOT;  Surgeon: Caprice Beaver, DPM;  Location: [facility];  Service: Podiatry;  Laterality: Left;   WOUND DEBRIDEMENT Left 10/26/2018   Procedure: DEBRIDEMENT ULCER LEFT FOOT;  Surgeon: Caprice Beaver, DPM;  Location: [facility];  Service: Podiatry;  Laterality: Left;     Social History:   reports that he has never smoked. He quit smokeless tobacco use about 32 years ago.  His smokeless tobacco use included chew. He reports that he does not currently use alcohol. He reports that he does not use drugs.   Family History:  His family history is not on file.   Allergies Allergies  Allergen Reactions   Morphine     hallucinations     Home Medications  Prior to Admission medications   Medication Sig Start Date End Date Taking? Authorizing Provider  APPLE CIDER VINEGAR PO Take 450 mg by mouth daily.   Yes [provider]  aspirin EC (BAYER ASPIRIN EC LOW DOSE) 81 MG tablet Take 81 mg by mouth daily.   Yes [provider]  atorvastatin (LIPITOR) 20 MG tablet Take 20 mg by mouth every evening.   Yes [provider]  escitalopram (LEXAPRO) 20 MG tablet Take 20 mg by mouth daily. 01/04/22  Yes [provider]  folic acid (FOLVITE) 1 MG tablet Take 1 tablet by mouth daily. 12/04/21  Yes [provider]  glipiZIDE (GLUCOTROL) 5 MG tablet Take 5 mg by mouth 2 (two) times daily before a meal. 05/22/20  Yes [provider]  omeprazole (PRILOSEC) 20 MG capsule Take 20 mg by mouth daily. 01/14/22  Yes [provider]     Critical care time:     Erma Heritage, NP-S Cct deferred by Attending

## 2022-02-11 ENCOUNTER — Encounter (HOSPITAL_COMMUNITY): Payer: Self-pay

## 2022-02-11 LAB — CBC
HCT: 32.2 % — ABNORMAL LOW (ref 39.0–52.0)
HCT: 31.7 % — ABNORMAL LOW (ref 39.0–52.0)
Hemoglobin: 9.5 g/dL — ABNORMAL LOW (ref 13.0–17.0)
Hemoglobin: 9.7 g/dL — ABNORMAL LOW (ref 13.0–17.0)
MCH: 23.3 pg — ABNORMAL LOW (ref 26.0–34.0)
MCH: 23.2 pg — ABNORMAL LOW (ref 26.0–34.0)
MCHC: 29.5 g/dL — ABNORMAL LOW (ref 30.0–36.0)
MCHC: 30.6 g/dL (ref 30.0–36.0)
MCV: 78.9 fL — ABNORMAL LOW (ref 80.0–100.0)
MCV: 75.8 fL — ABNORMAL LOW (ref 80.0–100.0)
Platelets: 430 10*3/uL — ABNORMAL HIGH (ref 150–400)
Platelets: 449 10*3/uL — ABNORMAL HIGH (ref 150–400)
RBC: 4.08 MIL/uL — ABNORMAL LOW (ref 4.22–5.81)
RBC: 4.18 MIL/uL — ABNORMAL LOW (ref 4.22–5.81)
RDW: 15.2 % (ref 11.5–15.5)
RDW: 15.6 % — ABNORMAL HIGH (ref 11.5–15.5)
WBC: 10 10*3/uL (ref 4.0–10.5)
WBC: 14.3 10*3/uL — ABNORMAL HIGH (ref 4.0–10.5)
nRBC: 0 % (ref 0.0–0.2)
nRBC: 0 % (ref 0.0–0.2)

## 2022-02-11 LAB — GLUCOSE, CAPILLARY
Glucose-Capillary: 108 mg/dL — ABNORMAL HIGH (ref 70–99)
Glucose-Capillary: 127 mg/dL — ABNORMAL HIGH (ref 70–99)
Glucose-Capillary: 131 mg/dL — ABNORMAL HIGH (ref 70–99)
Glucose-Capillary: 137 mg/dL — ABNORMAL HIGH (ref 70–99)
Glucose-Capillary: 137 mg/dL — ABNORMAL HIGH (ref 70–99)
Glucose-Capillary: 138 mg/dL — ABNORMAL HIGH (ref 70–99)
Glucose-Capillary: 150 mg/dL — ABNORMAL HIGH (ref 70–99)

## 2022-02-11 LAB — BASIC METABOLIC PANEL
Anion gap: 12 (ref 5–15)
BUN: 19 mg/dL (ref 6–20)
CO2: 21 mmol/L — ABNORMAL LOW (ref 22–32)
Calcium: 7.6 mg/dL — ABNORMAL LOW (ref 8.9–10.3)
Chloride: 105 mmol/L (ref 98–111)
Creatinine, Ser: 1.28 mg/dL — ABNORMAL HIGH (ref 0.61–1.24)
GFR, Estimated: >60 mL/min (ref >60)
Glucose, Bld: 136 mg/dL — ABNORMAL HIGH (ref 70–99)
Potassium: 4.3 mmol/L (ref 3.5–5.1)
Sodium: 138 mmol/L (ref 135–145)

## 2022-02-11 LAB — MRSA NEXT GEN BY PCR, NASAL: MRSA by PCR Next Gen: NOT DETECTED

## 2022-02-11 MED ORDER — LACTATED RINGERS IV BOLUS
500.0000 mL | Freq: Once | INTRAVENOUS | Status: AC
  Administered 2022-02-11: 500 mL via INTRAVENOUS

## 2022-02-11 MED ORDER — FUROSEMIDE 10 MG/ML IJ SOLN
40.0000 mg | Freq: Once | INTRAMUSCULAR | Status: AC
  Administered 2022-02-11: 40 mg via INTRAVENOUS
  Filled 2022-02-11: qty 4

## 2022-02-11 NOTE — Anesthesia Postprocedure Evaluation (Signed)
Anesthesia Post Note  Patient: Patrick [name]  Procedure(s) Performed: EXPLORATORY LAPAROTOMY WITH BOWEL RESECTIOM APPLICATION OF WOUND VAC     Patient location during evaluation: PACU Anesthesia Type: General Level of consciousness: awake and alert Pain management: pain level controlled Vital Signs Assessment: post-procedure vital signs reviewed and stable Respiratory status: spontaneous breathing, nonlabored ventilation and respiratory function stable Cardiovascular status: blood pressure returned to baseline Postop Assessment: no apparent nausea or vomiting Anesthetic complications: no   No notable events documented.      Marthenia Rolling

## 2022-02-11 NOTE — Consult Note (Signed)
[place] Nurse ostomy consult note Exp lap 02/12/2022 with 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  Stoma type/location: RUQ  Stomal assessment/size: dusky, skin level 1 1/2" x 7/8"  Peristomal assessment: intact  Treatment options for stomal/peristomal skin: 2" barrier ring  Output minimal yellow fluid in bag  Ostomy pouching: 1pc. Education provided: Wife and patient present for education and fully engaged.  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  Explained role of ostomy nurse and creation of stoma  Explained stoma characteristics (budded, flush, color, texture, care) - discussed that stoma is skin level and oval.   Demonstrated pouch change.  Demonstrated  with patient and wife removal of old skin barrier using the push and pull method, cleaning around stoma with water moistened washcloth only. Discussed with patient and wife that the stoma itself is insensate and will not be harmed by cleaning with washcloth but it is normal finding for it to bleed slightly.   Demonstrated cutting new skin barrier and discussed that his stoma is oval therefore will need to cut skin barrier oval to avoid exposed skin.  Also discussed that using a barrier ring around the stoma gives an extra layer of protection for peristomal skin and demonstrated stretching into an oval shape and placing around stoma.   Education on emptying when 1/3 to 1/2 full and how to empty Demonstrated "burping" flatus from pouch with 2 piece although did place in a 1 piece at this visit.   Demonstrated use of wick to clean spout  Did discuss bathing and avoiding soaps and lotions/creams around stoma as this could leave residue that would make it difficult for skin barrier to adhere.    Answered patient/family questions.    WOC will follow this patient for further support and education. Supplies ordered for bedside.  Enrolled patient in [place] program:  Yes  Thanks,    [employer] MSN, RN-BC, [employer]

## 2022-02-11 NOTE — Progress Notes (Signed)
Progress Note  1 Day Post-Op  Subjective: Pt reports some abdominal pain but does not seem severe. Asking about how he will have bowel function and we discussed that he will have stools from colostomy now. Asking if colostomy permanent. Asking about return to work. Seems to have limited insight into overall condition.   Objective: Vital signs in last 24 hours: Temp:  [97.2 F (36.2 C)-98.4 F (36.9 C)] 97.2 F (36.2 C) (01/25 0800) Pulse Rate:  [83-101] 101 (01/25 0800) Resp:  [11-20] 18 (01/25 0800) BP: (81-136)/(57-75) 99/65 (01/25 0800) SpO2:  [90 %-100 %] 100 % (01/25 0800) Arterial Line BP: (90-96)/(75-77) 90/77 (01/24 1400) Last BM Date :  (pta)  Intake/Output from previous day: 01/24 0701 - 01/25 0700 In: 8807.8 [I.V.:5175.1; Blood:315; IV Piggyback:2217.8] Out: 2255 [Urine:875; Emesis/NG output:250; Drains:680; Blood:250] Intake/Output this shift: Total I/O In: 47.3 [I.V.:47.3] Out: 85 [Urine:45; Drains:40]  PE: General: pleasant, WD, obese male who is laying in bed in NAD Heart: regular, rate, and rhythm.  Normal s1,s2. No obvious murmurs, gallops, or rubs noted.  Palpable radial and pedal pulses bilaterally Lungs: CTAB, no wheezes, rhonchi, or rales noted.  Respiratory effort nonlabored Abd: soft, appropriately ttp, VAC to midline, JP with SS fluid, stoma dusky but appears viable without any output yet, NGT with minimal thin non-bilious drainage GU: foley with tea colored urine  Psych: A&Ox3 with an appropriate affect.    Lab Results:  Recent Labs    02/16/2022 1702 02/11/22 0432  WBC 7.4 10.0  HGB 10.3* 9.5*  HCT 33.9* 32.2*  PLT 457* 430*   BMET Recent Labs    02/15/2022 0535 01/26/2022 1200 02/11/22 0432  NA 134* 138 138  K 4.0 4.1 4.3  CL 103  --  105  CO2 21*  --  21*  GLUCOSE 144*  --  136*  BUN 19  --  19  CREATININE 1.40*  --  1.28*  CALCIUM 7.6*  --  7.6*   PT/INR No results for input(s): "LABPROT", "INR" in the last 72 hours. CMP      Component Value Date/Time   NA 138 02/11/2022 0432   K 4.3 02/11/2022 0432   CL 105 02/11/2022 0432   CO2 21 (L) 02/11/2022 0432   GLUCOSE 136 (H) 02/11/2022 0432   BUN 19 02/11/2022 0432   CREATININE 1.28 (H) 02/11/2022 0432   CALCIUM 7.6 (L) 02/11/2022 0432   PROT 7.0 01/21/2022 1710   ALBUMIN 2.6 (L) 02/07/2022 1710   AST 24 02/04/2022 1710   ALT 17 01/29/2022 1710   ALKPHOS 93 01/30/2022 1710   BILITOT 1.5 (H) 02/08/2022 1710   GFRNONAA >60 02/11/2022 0432   GFRAA >60 10/20/2018 1049   Lipase     Component Value Date/Time   LIPASE 26 01/18/2022 1710       Studies/Results: CT ABDOMEN PELVIS WO CONTRAST  Result Date: 02/08/2022 CLINICAL DATA:  Patient sent over from primary care physician for ischemic bowel and subsequent abdominal pain. EXAM: CT ABDOMEN AND PELVIS WITHOUT CONTRAST TECHNIQUE: Multidetector CT imaging of the abdomen and pelvis was performed following the standard protocol without IV contrast. RADIATION DOSE REDUCTION: This exam was performed according to the departmental dose-optimization program which includes automated exposure control, adjustment of the mA and/or kV according to patient size and/or use of iterative reconstruction technique. COMPARISON:  None Available. FINDINGS: Lower chest: Multiple sternal wires are present. Mild to moderate severity lingular and left lower lobe linear atelectasis is seen. Mild atelectatic changes are  also noted within the right lower lobe. Hepatobiliary: No focal liver abnormality is seen. A layer of tiny gallstones is seen within the dependent portion of the gallbladder. There is no evidence of gallbladder Blum thickening or biliary dilatation. Pancreas: Atrophic changes are seen involving the body and head of the pancreas. The pancreatic tail is not clearly identified (see below). There is no evidence of pancreatic ductal dilatation. Spleen: Normal in size without focal abnormality. Adrenals/Urinary Tract: Adrenal glands are  unremarkable. Kidneys are normal in size with heterogeneous enhancement of the renal parenchyma noted. A 16 mm diameter simple cyst is seen within the posterolateral aspect of the mid left kidney. No obstructing renal calculi or hydronephrosis is seen. Contrast is seen throughout the lumen of a mildly distended urinary bladder. Stomach/Bowel: A 12.5 cm x 15.3 cm x 17.8 cm lobulated, ill-defined, area containing fluid, extraluminal air and heterogeneous material is seen within the mid and upper left abdomen. This originates from the distal transverse colon which is markedly thickened and inflamed (best seen on coronal reformatted images 25 through 35, CT series 6). There is subsequently limited evaluation of the tail of the pancreas, left upper quadrant small bowel loops and a portion of the body of the stomach. Marked severity surrounding mesenteric inflammatory fat stranding and numerous enlarged, adjacent mesenteric lymph nodes are seen (the largest measures approximately 2.4 cm). Dilated, markedly inflamed small bowel is seen extending into this left upper quadrant inflammatory process (best seen on coronal reformatted images 19 through 40, CT series 6). A gradual transition zone is seen as the affected small bowel loops exit the left upper quadrant. Vascular/Lymphatic: Aortic atherosclerosis. No enlarged abdominal or pelvic lymph nodes. Reproductive: The prostate gland is mildly enlarged and heterogeneous in appearance. Dense, curvilinear parenchymal calcifications are seen. Other: A 1.9 cm x 2.0 cm x 2.8 cm fat containing umbilical hernia is noted. A mild-to-moderate amount of pelvic free fluid is seen. Musculoskeletal: Multilevel degenerative changes are seen throughout the lumbar spine. IMPRESSION: 1. Marked severity colitis involving the distal transverse colon with an adjacent 12.5 cm x 15.3 cm x 17.8 cm abscess within the mid and upper left abdomen, as described above. The presence of an underlying  neoplastic process cannot be excluded. 2. Markedly inflamed, likely ischemic, partially obstructed proximal small bowel loops within the left upper quadrant. 3. Cholelithiasis. 4. Heterogeneous enhancement of the renal parenchyma which may represent sequelae associated with acute pyelonephritis. Correlation with urinalysis is recommended. 5. Small fat-containing umbilical hernia. 6. Evidence of prior median sternotomy with lingular and left lower lobe linear atelectasis. 7. Mild to moderate amount of pelvic free fluid. 8. Mild prostatomegaly.  Correlation with PSA values is recommended. 9. Aortic atherosclerosis. Aortic Atherosclerosis (ICD10-I70.0). Electronically Signed   By: Virgina Norfolk M.D.   On: 02/15/2022 23:20    Anti-infectives: Anti-infectives (From admission, onward)    Start     Dose/Rate Route Frequency Ordered Stop   01/25/2022 1100  piperacillin-tazobactam (ZOSYN) IVPB 3.375 g        3.375 g 100 mL/hr over 30 Minutes Intravenous  Once 02/07/2022 1056 01/23/2022 1230   02/05/2022 0200  piperacillin-tazobactam (ZOSYN) IVPB 3.375 g        3.375 g 12.5 mL/hr over 240 Minutes Intravenous Every 8 hours 01/26/2022 2322 02/17/22 0159   01/28/2022 1900  piperacillin-tazobactam (ZOSYN) IVPB 3.375 g        3.375 g 100 mL/hr over 30 Minutes Intravenous  Once 02/17/2022 1857 02/16/2022 2012  Assessment/Plan 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, proximal small bowel with ischemic necrotic proximal jejunum with perforation fecal contamination with abscess   - POD1 S/P Exploratory laparotomy with resection of transverse colon, drainage of mesenteric abscess, resection of proximal jejunum with small bowel anastomosis and 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  - no bowel function yet, continue NGT on LIWS, consider TPN if no output in the next few days - JP SS, continue to monitor - VAC to midline, will change tomorrow at bedside  - WOC following for new  ostomy - continue foley with low UOP and soft BP - 500 cc bolus this AM - hgb 9.5, continue to monitor  - continue IV abx - surgical path is pending  AKI - Cr 1.28, trending down, continue to monitor   FEN: NPO, IVF, NGT to LIWS VTE: LMWH ID: [email or web address] 1/23>>  T2DM with hx of foot infection 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

## 2022-02-11 NOTE — Progress Notes (Addendum)
Patient maintaining ST 115-120's at rest. Patient states pain is well managed with current regimen. Currently states in no pain, no complaints at present. Patient afebrile. BP's remain soft with SBP 90-100's MAPs >65 PA Kelly made aware, STAT CBC ordered.

## 2022-02-11 NOTE — Progress Notes (Signed)
Physical Therapy Evaluation Patient Details Name: Patrick [name] MRN: [id number] DOB: 03-09-1970 Today's Date: 02/11/2022  History of Present Illness  Pt is a 52 y/o male admitted 1/24 with progressive anorexia, weight loss and abdominal mass.  1/24 pt s/p exp. Lap. With resection of transverse colon, drainage of mesenteric abscess, resection of proximal jejunum with small bowel anastomosis and diverting right colostomy.  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  Clinical Impression  Pt admitted with/for management of symptoms described above with surgical management described above.  Presently, pt needing min to moderate assist for basic mobility and gait with the RW.Marland Kitchen  Pt currently limited functionally due to the problems listed below.  (see problems list.)  Pt will benefit from PT to maximize function and safety to be able to get home safely with available assist.        Recommendations for follow up therapy are one component of a multi-disciplinary discharge planning process, led by the attending physician.  Recommendations may be updated based on patient status, additional functional criteria and insurance authorization.  Follow Up Recommendations Home health PT (if progresses as expected)      Assistance Recommended at Discharge Intermittent Supervision/Assistance  Patient can return home with the following  A little help with walking and/or transfers;A little help with bathing/dressing/bathroom;Assistance with cooking/housework;Assist for transportation;Help with stairs or ramp for entrance    Equipment Recommendations  (TBD)  Recommendations for Other Services       Functional Status Assessment Patient has had a recent decline in their functional status and demonstrates the ability to make significant improvements in function in a reasonable and predictable amount of time.     Precautions / Restrictions  Precautions Precautions: Fall      Mobility  Bed Mobility Overal bed mobility: Needs Assistance Bed Mobility: Supine to Sit, Sit to Supine     Supine to sit: Min assist Sit to supine: Min assist   General bed mobility comments: assist to roll over onto L elbow with truncal assist up from L elbow.    Transfers Overall transfer level: Needs assistance   Transfers: Sit to/from Stand Sit to Stand: Mod assist           General transfer comment: min to boost and then moderate assist for stability.    Ambulation/Gait Ambulation/Gait assistance: Mod assist Gait Distance (Feet): 120 Feet Assistive device: Rolling walker (2 wheels) Gait Pattern/deviations: Step-through pattern, Decreased step length - right, Decreased step length - left, Decreased stride length   Gait velocity interpretation: <1.31 ft/sec, indicative of household ambulator   General Gait Details: short, slow steps with consistent list L in the RW, needing consistent moderate assist  Stairs            Wheelchair Mobility    Modified Rankin (Stroke Patients Only)       Balance Overall balance assessment: Needs assistance Sitting-balance support: Single extremity supported, No upper extremity supported, Feet supported Sitting balance-Leahy Scale: Fair     Standing balance support: Single extremity supported, During functional activity, Reliant on assistive device for balance Standing balance-Leahy Scale: Poor Standing balance comment: instability overall with external support and IV pole.  Improvement with the RW.                             Pertinent Vitals/Pain Pain Assessment Pain Assessment: Faces Faces Pain Scale: Hurts whole lot Pain  Descriptors / Indicators: Burning, Sore Pain Intervention(s): Limited activity within patient's tolerance    Home Living Family/patient expects to be discharged to:: Private residence Living Arrangements: Spouse/significant other Available  Help at Discharge: Family;Available 24 hours/day Type of Home: House Home Access: Stairs to enter       Home Layout: Two level;Bed/bath upstairs Home Equipment: Kasandra Knudsen - single point      Prior Function Prior Level of Function : Independent/Modified Independent;Working/employed                     Hand Dominance        Extremity/Trunk Assessment   Upper Extremity Assessment Upper Extremity Assessment: Overall WFL for tasks assessed    Lower Extremity Assessment Lower Extremity Assessment: Generalized weakness;Overall WFL for tasks assessed (bil foot neuropathy)    Cervical / Trunk Assessment Cervical / Trunk Assessment: Normal  Communication   Communication: No difficulties  Cognition Arousal/Alertness: Awake/alert Behavior During Therapy: WFL for tasks assessed/performed Overall Cognitive Status: Within Functional Limits for tasks assessed                                          General Comments General comments (skin integrity, edema, etc.): VSS    Exercises     Assessment/Plan    PT Assessment Patient needs continued PT services  PT Problem List Decreased strength;Decreased activity tolerance;Decreased balance;Decreased mobility;Decreased knowledge of use of DME;Pain       PT Treatment Interventions DME instruction;Gait training;Stair training;Functional mobility training;Therapeutic activities;Balance training;Patient/family education    PT Goals (Current goals can be found in the Care Plan section)  Acute Rehab PT Goals Patient Stated Goal: get home, back to work eventually PT Goal Formulation: With patient Time For Goal Achievement: 02/25/22 Potential to Achieve Goals: Good    Frequency Min 3X/week     Co-evaluation               AM-PAC PT "6 Clicks" Mobility  Outcome Measure Help needed turning from your back to your side while in a flat bed without using bedrails?: A Little Help needed moving from lying on your  back to sitting on the side of a flat bed without using bedrails?: A Lot Help needed moving to and from a bed to a chair (including a wheelchair)?: A Lot Help needed standing up from a chair using your arms (e.g., wheelchair or bedside chair)?: A Lot Help needed to walk in hospital room?: A Lot Help needed climbing 3-5 steps with a railing? : A Lot 6 Click Score: 13    End of Session     Patient left: in bed;with call bell/phone within reach;with nursing/sitter in room Nurse Communication: Mobility status PT Visit Diagnosis: Unsteadiness on feet (R26.81);Muscle weakness (generalized) (M62.81);Pain Pain - part of body:  (abdominal incisions.)    Time: 5726-2035 PT Time Calculation (min) (ACUTE ONLY): 29 min   Charges:   PT Evaluation $PT Eval Moderate Complexity: 1 Mod PT Treatments $Gait Training: 8-22 mins        02/11/2022  Ginger Carne., PT Acute Rehabilitation Services [phone]  (office)  [name] Lynnmarie Lovett 02/11/2022, 5:57 PM

## 2022-02-11 NOTE — Progress Notes (Signed)
E-link notified of patient's hypotension and urine output of 155 mL for the shift.

## 2022-02-12 ENCOUNTER — Inpatient Hospital Stay: Payer: Self-pay

## 2022-02-12 DIAGNOSIS — R1900 Intra-abdominal and pelvic swelling, mass and lump, unspecified site: Secondary | ICD-10-CM | POA: Diagnosis not present

## 2022-02-12 DIAGNOSIS — E861 Hypovolemia: Secondary | ICD-10-CM | POA: Diagnosis not present

## 2022-02-12 LAB — CBC
HCT: 25.5 % — ABNORMAL LOW (ref 39.0–52.0)
Hemoglobin: 8 g/dL — ABNORMAL LOW (ref 13.0–17.0)
MCH: 23.7 pg — ABNORMAL LOW (ref 26.0–34.0)
MCHC: 31.4 g/dL (ref 30.0–36.0)
MCV: 75.7 fL — ABNORMAL LOW (ref 80.0–100.0)
Platelets: 493 10*3/uL — ABNORMAL HIGH (ref 150–400)
RBC: 3.37 MIL/uL — ABNORMAL LOW (ref 4.22–5.81)
RDW: 15.6 % — ABNORMAL HIGH (ref 11.5–15.5)
WBC: 10.6 10*3/uL — ABNORMAL HIGH (ref 4.0–10.5)
nRBC: 0 % (ref 0.0–0.2)

## 2022-02-12 LAB — PHOSPHORUS: Phosphorus: 2.9 mg/dL (ref 2.5–4.6)

## 2022-02-12 LAB — PREPARE RBC (CROSSMATCH)

## 2022-02-12 LAB — GLUCOSE, CAPILLARY
Glucose-Capillary: 110 mg/dL — ABNORMAL HIGH (ref 70–99)
Glucose-Capillary: 120 mg/dL — ABNORMAL HIGH (ref 70–99)
Glucose-Capillary: 122 mg/dL — ABNORMAL HIGH (ref 70–99)
Glucose-Capillary: 138 mg/dL — ABNORMAL HIGH (ref 70–99)
Glucose-Capillary: 144 mg/dL — ABNORMAL HIGH (ref 70–99)
Glucose-Capillary: 95 mg/dL (ref 70–99)

## 2022-02-12 LAB — BASIC METABOLIC PANEL
Anion gap: 11 (ref 5–15)
BUN: 20 mg/dL (ref 6–20)
CO2: 23 mmol/L (ref 22–32)
Calcium: 7.5 mg/dL — ABNORMAL LOW (ref 8.9–10.3)
Chloride: 105 mmol/L (ref 98–111)
Creatinine, Ser: 1.06 mg/dL (ref 0.61–1.24)
GFR, Estimated: >60 mL/min (ref >60)
Glucose, Bld: 107 mg/dL — ABNORMAL HIGH (ref 70–99)
Potassium: 3.7 mmol/L (ref 3.5–5.1)
Sodium: 139 mmol/L (ref 135–145)

## 2022-02-12 LAB — MAGNESIUM: Magnesium: 1.6 mg/dL — ABNORMAL LOW (ref 1.7–2.4)

## 2022-02-12 MED ORDER — SODIUM CHLORIDE 0.9% IV SOLUTION: Freq: Once | INTRAVENOUS | Status: AC

## 2022-02-12 MED ORDER — ALBUMIN HUMAN 25 % IV SOLN
25.0000 g | Freq: Once | INTRAVENOUS | Status: AC
  Administered 2022-02-12: 25 g via INTRAVENOUS
  Filled 2022-02-12: qty 100

## 2022-02-12 MED ORDER — POTASSIUM CHLORIDE 10 MEQ/100ML IV SOLN
10.0000 meq | INTRAVENOUS | Status: AC
  Administered 2022-02-12 (×3): 10 meq via INTRAVENOUS
  Filled 2022-02-12 (×3): qty 100

## 2022-02-12 MED ORDER — LACTATED RINGERS IV SOLN: INTRAVENOUS | Status: AC

## 2022-02-12 MED ORDER — MAGNESIUM SULFATE 2 GM/50ML IV SOLN
2.0000 g | Freq: Once | INTRAVENOUS | Status: AC
  Administered 2022-02-12: 2 g via INTRAVENOUS
  Filled 2022-02-12: qty 50

## 2022-02-12 MED ORDER — TRAVASOL 10 % IV SOLN
INTRAVENOUS | Status: AC
  Filled 2022-02-12: qty 470.4

## 2022-02-12 MED ORDER — SODIUM CHLORIDE 0.9% FLUSH
10.0000 mL | Freq: Two times a day (BID) | INTRAVENOUS | Status: DC
  Administered 2022-02-12: 10 mL
  Administered 2022-02-13: 30 mL
  Administered 2022-02-14: 20 mL
  Administered 2022-02-14: 10 mL
  Administered 2022-02-15: 20 mL
  Administered 2022-02-15 – 2022-02-18 (×6): 10 mL
  Administered 2022-02-19: 20 mL
  Administered 2022-02-19 – 2022-02-22 (×4): 10 mL
  Administered 2022-02-22: 30 mL
  Administered 2022-02-23 – 2022-02-25 (×6): 10 mL
  Administered 2022-02-26: 30 mL
  Administered 2022-02-26 – 2022-02-27 (×2): 10 mL
  Administered 2022-02-27: 30 mL
  Administered 2022-02-28: 10 mL
  Administered 2022-02-28: 30 mL

## 2022-02-12 MED ORDER — ALBUMIN HUMAN 5 % IV SOLN
25.0000 g | Freq: Once | INTRAVENOUS | Status: AC
  Administered 2022-02-12: 25 g via INTRAVENOUS
  Filled 2022-02-12: qty 500

## 2022-02-12 MED ORDER — LACTATED RINGERS IV BOLUS: 1000.0000 mL | Freq: Once | INTRAVENOUS | Status: DC

## 2022-02-12 MED ORDER — SODIUM CHLORIDE 0.9% FLUSH: 10.0000 mL | INTRAVENOUS | Status: DC | PRN

## 2022-02-12 MED ORDER — THIAMINE HCL 100 MG/ML IJ SOLN
100.0000 mg | Freq: Once | INTRAMUSCULAR | Status: AC
  Administered 2022-02-12: 100 mg via INTRAVENOUS
  Filled 2022-02-12: qty 2

## 2022-02-12 NOTE — Evaluation (Signed)
Occupational Therapy Evaluation Patient Details Name: Patrick [name] MRN: [id number] DOB: 1970-05-20 Today's Date: 02/12/2022   History of Present Illness Pt is a 52 y/o male admitted 1/24 with progressive anorexia, weight loss and abdominal mass.  1/24 pt s/p exp. Lap. With resection of transverse colon, drainage of mesenteric abscess, resection of proximal jejunum with small bowel anastomosis and diverting right colostomy.  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   Clinical Impression   Patient admitted for the diagnosis above.  PTA he lives with his spouse, continues to work, and needed no assist for ADL, iADL, or mobility.   Currently he is needing up to [place] for basic mobility, and up to Mod A for lower body ADL.  OT will follow in the acute setting to address deficits listed below, and assist with an eventual transition back home.  [place] OT will really depend on progress made, patient's diagnosis and his desire to want HH therapies.       Recommendations for follow up therapy are one component of a multi-disciplinary discharge planning process, led by the attending physician.  Recommendations may be updated based on patient status, additional functional criteria and insurance authorization.   Follow Up Recommendations  No OT follow up     Assistance Recommended at Discharge Intermittent Supervision/Assistance  Patient can return home with the following Assist for transportation;Assistance with cooking/housework;A little help with bathing/dressing/bathroom;A little help with walking and/or transfers    Functional Status Assessment  Patient has had a recent decline in their functional status and demonstrates the ability to make significant improvements in function in a reasonable and predictable amount of time.  Equipment Recommendations  BSC/3in1;Tub/shower bench    Recommendations for Other Services        Precautions / Restrictions Precautions Precautions: Fall Precaution Comments: NG tube, JP Drain, Wound Vac, watch O2 Restrictions Weight Bearing Restrictions: No      Mobility Bed Mobility Overal bed mobility: Needs Assistance Bed Mobility: Sidelying to Sit   Sidelying to sit: Min assist            Transfers Overall transfer level: Needs assistance Equipment used: 1 person hand held assist Transfers: Sit to/from Stand, Bed to chair/wheelchair/BSC Sit to Stand: Min assist     Step pivot transfers: Min assist            Balance Overall balance assessment: Needs assistance Sitting-balance support: Feet supported, Bilateral upper extremity supported Sitting balance-Leahy Scale: Fair     Standing balance support: Bilateral upper extremity supported Standing balance-Leahy Scale: Poor                             ADL either performed or assessed with clinical judgement   ADL       Grooming: Wash/dry hands;Wash/dry face;Set up;Sitting           Upper Body Dressing : Minimal assistance;Sitting   Lower Body Dressing: Moderate assistance;Sit to/from stand                       Vision Patient Visual Report: No change from baseline       Perception     Praxis      Pertinent Vitals/Pain Pain Assessment Faces Pain Scale: Hurts even more Pain Descriptors / Indicators: Tender, Sore Pain Intervention(s): Premedicated before session     Hand Dominance Right  Extremity/Trunk Assessment Upper Extremity Assessment Upper Extremity Assessment: Generalized weakness   Lower Extremity Assessment Lower Extremity Assessment: Defer to PT evaluation   Cervical / Trunk Assessment Cervical / Trunk Assessment: Normal   Communication Communication Communication: No difficulties   Cognition Arousal/Alertness: Awake/alert Behavior During Therapy: Flat affect Overall Cognitive Status: Within Functional Limits for tasks assessed                                        General Comments   VSS on supplemental O2    Exercises     Shoulder Instructions      Home Living Family/patient expects to be discharged to:: Private residence Living Arrangements: Spouse/significant other Available Help at Discharge: Family;Available 24 hours/day Type of Home: House Home Access: Stairs to enter     Home Layout: Two level;Bed/bath upstairs     Bathroom Shower/Tub: Tub/shower unit;Walk-in shower   Bathroom Toilet: Standard Bathroom Accessibility: Yes How Accessible: Accessible via walker Home Equipment: Cane - single point          Prior Functioning/Environment Prior Level of Function : Independent/Modified Independent;Working/employed                        OT Problem List: Decreased strength;Decreased activity tolerance;Impaired balance (sitting and/or standing);Pain      OT Treatment/Interventions: Self-care/ADL training;Therapeutic activities;Therapeutic exercise;Patient/family education;Balance training;DME and/or AE instruction    OT Goals(Current goals can be found in the care plan section) Acute Rehab OT Goals Patient Stated Goal: Return home OT Goal Formulation: With patient Time For Goal Achievement: 02/26/22 Potential to Achieve Goals: Good ADL Goals Pt Will Perform Grooming: with set-up;standing Pt Will Perform Lower Body Dressing: with adaptive equipment;sit to/from stand;with supervision Pt/caregiver will Perform Home Exercise Program: Increased strength;Both right and left upper extremity;With Supervision  OT Frequency: Min 2X/week    Co-evaluation              AM-PAC OT "6 Clicks" Daily Activity     Outcome Measure Help from another person eating meals?: Total Help from another person taking care of personal grooming?: A Little Help from another person toileting, which includes using toliet, bedpan, or urinal?: A Little Help from another person bathing (including washing,  rinsing, drying)?: A Lot Help from another person to put on and taking off regular upper body clothing?: A Little Help from another person to put on and taking off regular lower body clothing?: A Lot 6 Click Score: 14   End of Session Nurse Communication: Mobility status  Activity Tolerance: Patient tolerated treatment well Patient left: in chair;with call bell/phone within reach;with chair alarm set  OT Visit Diagnosis: Unsteadiness on feet (R26.81);Muscle weakness (generalized) (M62.81)                Time: 2248-2500 OT Time Calculation (min): 17 min Charges:  OT General Charges $OT Visit: 1 Visit OT Evaluation $OT Eval Moderate Complexity: 1 Mod  02/12/2022  RP, OTR/L  Acute Rehabilitation Services  Office:  [phone]   Metta Clines 02/12/2022, 2:13 PM

## 2022-02-12 NOTE — Progress Notes (Signed)
NAME:  Patrick [name], MRN:  [id number], DOB:  19-Nov-1970, LOS: 3 ADMISSION DATE:  01/23/2022, CONSULTATION DATE:  01/20/2022 REFERRING MD:  Edison Pace, CHIEF COMPLAINT:  Post op Hypotension    History of Present Illness:  Zandyr Barnhill. Romig is a 52 y.o. 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 Of note, he was seen by his PCP for severe abdominal pain x4 weeks that has increasingly progressed.   Patient was seen and evaluated by surgical team in which they believe on exam CT that he did not have free perforation/pneumoperitoneum/or peritoneal signs throughout the abdomen, however, did show an intra-abdominal mass measuring almost 15 cm at the level of the transverse colon splenic flexure.  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 IVF resuscitation and antibiotics started.   On 02/12/2022, patient was taken to the OR for Exploratory laparotomy with resection of transverse colon, drainage of mesenteric abscess, resection of proximal jejunum with small bowel anastomosis and 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. Post operatively patient developed hypotension requiring vasopressors.   PCCM consulted for post operative hypotension management.   Pertinent  Medical History   Past Medical History:  Diagnosis Date   Anxiety    Arthritis    Chronic osteomyelitis of left foot ([place]) 12/20/2018   Chronic osteomyelitis of right foot ([place]) 12/20/2018   Diabetes mellitus without complication ([place])    Diabetic foot infection ([place]) 12/20/2018   Diabetic neuropathy ([place]) 12/20/2018   History of kidney stones    Hx of CABG with Exclusion of left atrial appendage  2022   Morbid obesity ([place]) 12/20/2018   MSSA (methicillin susceptible Staphylococcus aureus) infection 12/20/2018   PONV (postoperative nausea and vomiting)     Significant Hospital Events: Including procedures, antibiotic start and stop dates in addition to other pertinent events   01/20/2022 - Admitted, IVF resuscitation and antibiotics 02/04/2022 - Exploratory Lap with resection of transverse colon, drainage of mesenteric abscess, resection of proximal jejunum with SB anastamosis and diverting right colostomy and long Harmans pouch. 1/26 PCCM called back due to tachypnea and borderline BP's  Interim History / Subjective:  BP 98/63 (73). Pt was tachypneic earlier and he and RN both state its worse when he has abdominal pain, better after medication. Currently breathing comfortably on room air speaking full sentences with family at bedside. He is eager to drink and eat, feels very thirsty and tongue very dry.  Objective   Blood pressure 100/68, pulse (!) 117, temperature 99.5 F (37.5 C), temperature source Oral, resp. rate 19, height 6' (1.829 m), weight 113.4 kg, SpO2 90 %.        Intake/Output Summary (Last 24 hours) at 02/12/2022 1120 Last data filed at 02/12/2022 1000 Gross per 24 hour  Intake 3451.13 ml  Output 2535 ml  Net 916.13 ml    Filed Weights   02/02/2022 1656  Weight: 113.4 kg   Physical Examination: General: Adult male, resting in bed, in NAD. Neuro: A&O x 3, no deficits. HEENT: [place]/AT. Sclerae anicteric. EOMI. MM quite dry. Cardiovascular: Tachy, regular, no M/R/G.  Lungs: Respirations even and unlabored.  CTA bilaterally, No W/R/R. Abdomen: Wound vac in place, colostomy bag noted and clean but pt  states he is passing flatus. Abd soft, NT/ND.  Musculoskeletal: No gross deformities, no edema.  Skin: Intact, warm, no rashes. + skin turgor on b/l hands.  Assessment & Plan:   Borderline hypotension - acceptable currently with MAP 73. He appears quite dry on exam with  very dry mucous membranes and skin turgor and is requesting to eat and drink. I/O's documented as +9L since admit but only +107 past 24 hrs. 1u PRBC has been ordered by primary team for Hgb 8. - Continue MIVF at 125/hr - 1L LR bolus over 2 hours now, can likely repeat again this afternoon if needed (holding for now given 1u PRBC ordered already). - 1u PRBC per primary team. - No role pressors at current and no role diuresis.  Tachypnea - related to abd pain, confirmed with pt and RN. Improve with pain meds. - Pain control with Dilaudid PRN. - Bronchial hygiene.  Retroperitoneal mass with invasion into transverse colon and prox small bowel with ischemic necrotic proximal jejunum with perforation and fecal contamination with abscess formation - s/p ex lap with resection of transverse colon, drainage of mesenteric abscess, resection of proximal jejunum with SB anastamosis and diverting right colostomy and long Harmans pouch on 1/24. - Per general surgery team. - Continue Zosyn and follow cultures.  Rest per primary team. Nothing further to add.  PCCM will sign off.  Please call us back if we can be of any further assistance.   Best Practice (right click and "Reselect all SmartList Selections" daily)   Diet/type: NPO DVT prophylaxis: LMWH GI prophylaxis: N/A Lines: N/A Foley:  Yes, and it is still needed Code Status:  full code Last date of multidisciplinary goals of care discussion '[]'$   Critical care time: N/A.     Montey Hora, [place] [facility] For pager details, please see AMION or use Epic chat  After 1900, please call [place] for cross coverage needs 02/12/2022, 11:30 AM

## 2022-02-12 NOTE — Progress Notes (Signed)
Initial Nutrition Assessment  DOCUMENTATION CODES:   Severe malnutrition in context of acute illness/injury  INTERVENTION:   TPN to meet nutrition needs  Supplement diet once NG removed and diet advanced    NUTRITION DIAGNOSIS:   Severe Malnutrition related to acute illness (mass with perforation) as evidenced by moderate fat depletion, energy intake < or equal to 50% for > or equal to 5 days.  GOAL:   Patient will meet greater than or equal to 90% of their needs  MONITOR:   I & O's, Diet advancement  REASON FOR ASSESSMENT:   Consult New TPN/TNA  ASSESSMENT:   Pt with PMH of DM s/p L transmetatarsal amputation secondary to osteomyelitis, CAD s/p CABG, arthritis, obesity, and anxiety admitted with severe abd pain x 4 weeks PTA.Pt with retroperitoneal mass with invasion into transverse colon and proximal small bowel with ischemic necrotic proximal jejunum with perforation and fecal contamination with abscess formation.   Pt discussed during ICU rounds and with RN.  Pt and family provide hx. They report that pt lost about 10 lb in the last 4 weeks (~ 3% weight loss). Prior to these last 4 weeks weight and PO intake have been stable. In the last 4 weeks pt was only to eat a few bites but everything he ate he threw up. Not able to tolerate any PO.   01/24 - s/p ex lap with resection of transverse colon, drainage of mesenteric abscess, resection of proximal jejunum with SB anastomosis and diverting R colostomy and long Hartman's pouch 01/26 - starting TPN   Medications reviewed and include: SSI LR @ 90 ml/hr  10 mEq KCl x 3 TPN @ 35 ml/hr to start this PM  Labs reviewed: Magnesium: 1.6 CBG's: 95-150   16 F NG tube for suction   UOP: 1245 ml  NG: 700 ml  JP: 395 ml  VAC: 0 Colostomy: 0  NUTRITION - FOCUSED PHYSICAL EXAM:  Flowsheet Row Most Recent Value  Orbital Region Moderate depletion  Upper Arm Region Mild depletion  Thoracic and Lumbar Region No depletion   Buccal Region Moderate depletion  Temple Region Moderate depletion  Clavicle Bone Region No depletion  Clavicle and Acromion Bone Region No depletion  Scapular Bone Region No depletion  Dorsal Hand Mild depletion  Patellar Region No depletion  Anterior Thigh Region No depletion  Posterior Calf Region No depletion  Edema (RD Assessment) None  Hair Reviewed  Eyes Reviewed  Mouth Reviewed  [dry]  Skin Reviewed  [vitiligo]  Nails Reviewed  [pale nail beds]       Diet Order:   Diet Order             Diet NPO time specified Except for: Ice Chips, Sips with Meds, Other (See Comments)  Diet effective now                   EDUCATION NEEDS:   Not appropriate for education at this time  Skin:  Skin Assessment: Reviewed RN Assessment  Last BM:  new colostomy not output yet  Height:   Ht Readings from Last 1 Encounters:  02/11/2022 6' (1.829 m)    Weight:   Wt Readings from Last 1 Encounters:  02/12/22 110.1 kg    BMI:  Body mass index is 32.92 kg/m.  Estimated Nutritional Needs:   Kcal:  2300-2500  Protein:  120-135 grams  Fluid:  >2 L/day  Lockie Pares., RD, LDN, CNSC See AMiON for contact information

## 2022-02-12 NOTE — Progress Notes (Signed)
Peripherally Inserted Central Catheter Placement  The IV Nurse has discussed with the patient and/or persons authorized to consent for the patient, the purpose of this procedure and the potential benefits and risks involved with this procedure.  The benefits include less needle sticks, lab draws from the catheter, and the patient may be discharged home with the catheter. Risks include, but not limited to, infection, bleeding, blood clot (thrombus formation), and puncture of an artery; nerve damage and irregular heartbeat and possibility to perform a PICC exchange if needed/ordered by physician.  Alternatives to this procedure were also discussed.  Bard Power PICC patient education guide, fact sheet on infection prevention and patient information card has been provided to patient /or left at bedside.    PICC Placement Documentation  PICC Triple Lumen 02/12/22 Right Brachial 4 cm 1 cm (Active)  Indication for Insertion or Continuance of Line Administration of hyperosmolar/irritating solutions (i.e. TPN, Vancomycin, etc.) 02/12/22 1650  Exposed Catheter (cm) 1 cm 02/12/22 1650  Site Assessment Clean, Dry, Intact 02/12/22 1650  Lumen #1 Status Flushed;Blood return noted;Saline locked 02/12/22 1650  Lumen #2 Status Flushed;Blood return noted;Saline locked 02/12/22 1650  Lumen #3 Status Flushed;Blood return noted;Saline locked 02/12/22 1650  Dressing Type Transparent 02/12/22 1650  Dressing Status Antimicrobial disc in place 02/12/22 [place] Not Applicable 26/33/35 4562  Line Care Connections checked and tightened 02/12/22 1650  Line Adjustment (NICU/IV Team Only) No 02/12/22 1650  Dressing Intervention New dressing 02/12/22 1650  Dressing Change Due 02/28/2022 02/12/22 1650       Scotty Court 02/12/2022, 4:57 PM

## 2022-02-12 NOTE — Progress Notes (Addendum)
[name] Progress Note Patient Name: Patrick [name] DOB: 03/02/1970 MRN: [id number]   Date of Service  02/12/2022  HPI/Events of Note  Patient with sinus tachycardia and soft blood pressures, likely from third spacing induced by his general peritonitis.  eICU Interventions  Albumin 5 % 500 ml iv bolus x 1 ordered. Will also check H & H to exclude hemorrhage.        Kerry Kass Zuriel Yeaman 02/12/2022, 1:51 AM

## 2022-02-12 NOTE — Progress Notes (Signed)
PHARMACY - TOTAL PARENTERAL NUTRITION CONSULT NOTE  Indication: Prolonged ileus  Patient Measurements: Height: 6' (182.9 cm) Weight: 113.4 kg (250 lb) IBW/kg (Calculated) : 77.6 TPN AdjBW (KG): 86.6 Body mass index is 33.91 kg/m.  Assessment:  71 YOM presented with abdominal pain for 4 weeks, found to have retroperitoneal mass with necrotic jejunum, perforated fecal contamination and abscess.  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  Patient has been NPO for 4 days since admission.  Pharmacy consulted to dose TPN.  Patient/wife reports he wasn't eating well for 4 weeks PTA.  If anything, he was on a liquid diet.  He endorses a ~10 lb weight loss during this time.  Previously, he typically eats 2 meals a day and snack inbetween.  He eats chicken, vegetables, beans and fruits.  Glucose / Insulin: hx DM on glipizide PTA.  CBGs < 180 on mSSI Electrolytes: K 3.7 post Lasix '40mg'$  IV (goal >/= 4), Mag 1.6 (goal >/= 2) others WNL Renal: SCr 1.06, BUN WNL Hepatic: LFTs WNL, tbili 1.5 Intake / Output; MIVF: UOP 0.5 ml/kg/hr, NG 783m, drain 3999m LR 125 ml/hr, net +8.9L GI Imaging: none since TPN initiation  GI Surgeries / Procedures: none since TPN initation  Central access: PICC ordered on 02/12/22 TPN start date: 02/12/22  Nutritional Goals: RD Assessment: pending   Estimated needs: 2150-2350 kCal, 130-145g protein per day  Current Nutrition:  NPO  Plan:  Thiamine '100mg'$  IV x 1 prior to TPN initiation given refeeding risk Start TPN at 35 ml/hr at 1800 (goal rate ~100 ml/hr) Electrolytes in TPN: Na 5044mL, K 77m12m, Ca 5mEq29m Mg 5mEq/67mPhos 20mmol62mor today, Cl:Ac 1:1 Add standard MVI and trace elements to TPN Continue moderate SSI Q4H Reduce LR to 90 mL/hr at 1800 (OK to reduce per protocol and CCM will bolus fluid as  needed) KCL x 3 runs Mag sulfate 2gm IV x 1 Monitor TPN labs on Mon/Thurs, labs in AM   Halsey Persaud D. Roxi Hlavaty, PMina MarbleD, BCPS, BCCCP [id number], 11:36 AM

## 2022-02-12 NOTE — Consult Note (Signed)
[place] Nurse Consult Note: NPWT dressing changed with Barkley Boards, PA-C CCS Reason for Consult: NPWT postop Exp lap w/diverting right colostomy 02/08/2022  Wound type: surgical  Measurement:Superior portion 14 cms, distal portion 12 cms (total length 26 cms), width 3.5 cms depth 3 cms  Wound bed: 80% red 20% subcutaneous tissue  Drainage (amount, consistency, odor) minimal serosanguinous  Periwound: intact  Dressing procedure/placement/frequency: Removed old NPWT dressing Cleansed wound with normal saline Filled wound with  _2__ pieces of black foam  Sealed NPWT dressing at 137m HG Patient received IV pain medication per bedside nurse prior to dressing change Patient tolerated procedure well  NPWT dressing M/W/F  WOC nurse will continue to provide NPWT dressing changed due to the complexity of the dressing change. Next change due Monday 02/15/2022.   WPerthNurse ostomy follow up Stoma assessed with KBarkley Boards PA-C CCS  Stoma type/location: RUQ  Stoma assessment: yellow mucosal sloughing that covers 50% of stoma, oval, skin level  Peristomal assessment: intact Treatment options for stomal/peristomal skin: barrier ring  Output minimal yellow fluid  Ostomy pouching: 1pc convex (lawson #[phone] No education provided at this visit on ostomy, seen 1/25 for teaching session and will provide additional education 02/15/2022.  Patient and wife engaged in conversation with PA regarding treatment.  [place] in [place]: Yes  WOC will follow this patient for support and further education.    Thank you,    Moxon Messler MSN, RN-BC, [employer]

## 2022-02-12 NOTE — Progress Notes (Signed)
Progress Note  2 Days Post-Op  Subjective: Pt reports he has had some flatus from stoma. Wants NGT out. Having abdominal pain with moving and coughing but not significant pain at rest. Wife at bedside. Patient starting to grasp more that he had a perforated cancer and with appropriate questions regarding that.   Objective: Vital signs in last 24 hours: Temp:  [98 F (36.7 C)-100.7 F (38.2 C)] 99.5 F (37.5 C) (01/26 0830) Pulse Rate:  [100-131] 117 (01/26 1000) Resp:  [16-27] 19 (01/26 1000) BP: (86-111)/(55-74) 100/68 (01/26 1000) SpO2:  [82 %-96 %] 90 % (01/26 1000) Last BM Date :  (pta)  Intake/Output from previous day: 01/25 0701 - 01/26 0700 In: 3662.5 [P.O.:300; I.V.:2221.7; IV Piggyback:1140.8] Out: 2340 [Urine:1245; Emesis/NG output:700; Drains:395] Intake/Output this shift: Total I/O In: 387.7 [I.V.:374.9; IV Piggyback:12.8] Out: 22 [Urine:150; Emesis/NG output:100; Drains:30]  PE: General: pleasant, WD, obese male who is laying in bed in NAD Heart: regular, rate, and rhythm.  Normal s1,s2. No obvious murmurs, gallops, or rubs noted.  Palpable radial and pedal pulses bilaterally Lungs: CTAB, no wheezes, rhonchi, or rales noted.  Respiratory effort nonlabored Abd: soft, appropriately ttp, midline as pictured below, JP with SS fluid, stoma dusky but appears viable flatus noted, NGT with minimal thin non-bilious drainage     GU: foley with clear concentrated appearing urine  Psych: A&Ox3 with an appropriate affect.    Lab Results:  Recent Labs    02/11/22 1703 02/12/22 0240  WBC 14.3* 10.6*  HGB 9.7* 8.0*  HCT 31.7* 25.5*  PLT 449* 493*   BMET Recent Labs    02/11/22 0432 02/12/22 0240  NA 138 139  K 4.3 3.7  CL 105 105  CO2 21* 23  GLUCOSE 136* 107*  BUN 19 20  CREATININE 1.28* 1.06  CALCIUM 7.6* 7.5*   PT/INR No results for input(s): "LABPROT", "INR" in the last 72 hours. CMP     Component Value Date/Time   NA 139 02/12/2022 0240   K  3.7 02/12/2022 0240   CL 105 02/12/2022 0240   CO2 23 02/12/2022 0240   GLUCOSE 107 (H) 02/12/2022 0240   BUN 20 02/12/2022 0240   CREATININE 1.06 02/12/2022 0240   CALCIUM 7.5 (L) 02/12/2022 0240   PROT 7.0 02/08/2022 1710   ALBUMIN 2.6 (L) 01/24/2022 1710   AST 24 02/05/2022 1710   ALT 17 01/28/2022 1710   ALKPHOS 93 02/05/2022 1710   BILITOT 1.5 (H) 02/15/2022 1710   GFRNONAA >60 02/12/2022 0240   GFRAA >60 10/20/2018 1049   Lipase     Component Value Date/Time   LIPASE 26 01/25/2022 1710       Studies/Results: [place] EKG SITE RITE  Result Date: 02/12/2022 If Site Rite image not attached, placement could not be confirmed due to current cardiac rhythm.   Anti-infectives: Anti-infectives (From admission, onward)    Start     Dose/Rate Route Frequency Ordered Stop   01/23/2022 1100  piperacillin-tazobactam (ZOSYN) IVPB 3.375 g        3.375 g 100 mL/hr over 30 Minutes Intravenous  Once 01/26/2022 1056 02/16/2022 1230   01/27/2022 0200  piperacillin-tazobactam (ZOSYN) IVPB 3.375 g        3.375 g 12.5 mL/hr over 240 Minutes Intravenous Every 8 hours 02/16/2022 2322 02/17/22 0159   02/14/2022 1900  piperacillin-tazobactam (ZOSYN) IVPB 3.375 g        3.375 g 100 mL/hr over 30 Minutes Intravenous  Once 02/16/2022 1857 02/05/2022 2012  Assessment/Plan 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, proximal small bowel with ischemic necrotic proximal jejunum with perforation fecal contamination with abscess   - POD2 S/P Exploratory laparotomy with resection of transverse colon, drainage of mesenteric abscess, resection of proximal jejunum with small bowel anastomosis and 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  - passing some flatus, clamp NGT and possibly remove this afternoon, sips and ice chips only - recommend PICC and TPN to help with nutritional support until able to reliably take in more PO - JP SS, 395cc/24h - VAC to midline change MWF - WOC  following for new ostomy - hgb 8.0, BP soft and tachy to 120, transfuse 1 unit PRBC - continue IV abx - surgical path is pending  AKI - Cr 1.06 today, trending down, continue to monitor  - continue foley for strict I&O, UOP improved some with lasix but BP soft, may need pressors and more diuresis - consulted CCM   FEN: sips/ice chips, IVF@ 125 cc/h, NGT clamp, TPN ordered  VTE: LMWH ID: [email or web address] 1/23>>   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

## 2022-02-13 DIAGNOSIS — E43 Unspecified severe protein-calorie malnutrition: Secondary | ICD-10-CM | POA: Insufficient documentation

## 2022-02-13 LAB — CBC
HCT: 23.4 % — ABNORMAL LOW (ref 39.0–52.0)
Hemoglobin: 7.3 g/dL — ABNORMAL LOW (ref 13.0–17.0)
MCH: 25.2 pg — ABNORMAL LOW (ref 26.0–34.0)
MCHC: 31.2 g/dL (ref 30.0–36.0)
MCV: 80.7 fL (ref 80.0–100.0)
Platelets: 424 10*3/uL — ABNORMAL HIGH (ref 150–400)
RBC: 2.9 MIL/uL — ABNORMAL LOW (ref 4.22–5.81)
RDW: 16.1 % — ABNORMAL HIGH (ref 11.5–15.5)
WBC: 11.7 10*3/uL — ABNORMAL HIGH (ref 4.0–10.5)
nRBC: 0 % (ref 0.0–0.2)

## 2022-02-13 LAB — GLUCOSE, CAPILLARY
Glucose-Capillary: 139 mg/dL — ABNORMAL HIGH (ref 70–99)
Glucose-Capillary: 138 mg/dL — ABNORMAL HIGH (ref 70–99)
Glucose-Capillary: 132 mg/dL — ABNORMAL HIGH (ref 70–99)
Glucose-Capillary: 138 mg/dL — ABNORMAL HIGH (ref 70–99)
Glucose-Capillary: 176 mg/dL — ABNORMAL HIGH (ref 70–99)

## 2022-02-13 LAB — TYPE AND SCREEN
ABO/RH(D): O POS
Antibody Screen: NEGATIVE
Unit division: 0
Unit division: 0

## 2022-02-13 LAB — COMPREHENSIVE METABOLIC PANEL
ALT: 11 U/L (ref 0–44)
AST: 20 U/L (ref 15–41)
Albumin: 1.7 g/dL — ABNORMAL LOW (ref 3.5–5.0)
Alkaline Phosphatase: 49 U/L (ref 38–126)
Anion gap: 10 (ref 5–15)
BUN: 14 mg/dL (ref 6–20)
CO2: 21 mmol/L — ABNORMAL LOW (ref 22–32)
Calcium: 7.4 mg/dL — ABNORMAL LOW (ref 8.9–10.3)
Chloride: 102 mmol/L (ref 98–111)
Creatinine, Ser: 0.92 mg/dL (ref 0.61–1.24)
GFR, Estimated: >60 mL/min (ref >60)
Glucose, Bld: 435 mg/dL — ABNORMAL HIGH (ref 70–99)
Potassium: 5.5 mmol/L — ABNORMAL HIGH (ref 3.5–5.1)
Sodium: 133 mmol/L — ABNORMAL LOW (ref 135–145)
Total Bilirubin: 3.4 mg/dL — ABNORMAL HIGH (ref 0.3–1.2)
Total Protein: 4 g/dL — ABNORMAL LOW (ref 6.5–8.1)

## 2022-02-13 LAB — BPAM RBC
Blood Product Expiration Date: 202402222359
Blood Product Expiration Date: 202402222359
ISSUE DATE / TIME: 202401241042
ISSUE DATE / TIME: 202401261135
Unit Type and Rh: 5100
Unit Type and Rh: 5100

## 2022-02-13 LAB — PHOSPHORUS
Phosphorus: 4 mg/dL (ref 2.5–4.6)
Phosphorus: 2.5 mg/dL (ref 2.5–4.6)

## 2022-02-13 LAB — BASIC METABOLIC PANEL
Anion gap: 9 (ref 5–15)
BUN: 15 mg/dL (ref 6–20)
CO2: 23 mmol/L (ref 22–32)
Calcium: 7.3 mg/dL — ABNORMAL LOW (ref 8.9–10.3)
Chloride: 102 mmol/L (ref 98–111)
Creatinine, Ser: 1.05 mg/dL (ref 0.61–1.24)
GFR, Estimated: >60 mL/min (ref >60)
Glucose, Bld: 150 mg/dL — ABNORMAL HIGH (ref 70–99)
Potassium: 3.7 mmol/L (ref 3.5–5.1)
Sodium: 134 mmol/L — ABNORMAL LOW (ref 135–145)

## 2022-02-13 LAB — MAGNESIUM
Magnesium: 2.1 mg/dL (ref 1.7–2.4)
Magnesium: 2.2 mg/dL (ref 1.7–2.4)

## 2022-02-13 LAB — TRIGLYCERIDES
Triglycerides: 334 mg/dL — ABNORMAL HIGH (ref <150)
Triglycerides: 173 mg/dL — ABNORMAL HIGH (ref <150)

## 2022-02-13 MED ORDER — TRAVASOL 10 % IV SOLN
INTRAVENOUS | Status: AC
  Filled 2022-02-13: qty 924

## 2022-02-13 MED ORDER — LACTATED RINGERS IV SOLN: INTRAVENOUS | Status: AC

## 2022-02-13 NOTE — Progress Notes (Addendum)
PHARMACY - TOTAL PARENTERAL NUTRITION CONSULT NOTE  Indication: Prolonged ileus  Patient Measurements: Height: 6' (182.9 cm) Weight: 112.8 kg (248 lb 10.9 oz) IBW/kg (Calculated) : 77.6 TPN AdjBW (KG): 86.6 Body mass index is 33.73 kg/m.  Assessment:  26 YOM presented with abdominal pain for 4 weeks, found to have retroperitoneal mass with necrotic jejunum, perforated fecal contamination and abscess.  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  Patient has been NPO for 4 days since admission.  Patient/wife report he wasn't eating well for 4 weeks PTA and basically on a liquid diet. He endorses a ~10 lb weight loss during this time. Previously, he typically ate 2 meals a day (chicken, vegetables, beans and fruits) and 1 snack. Pharmacy consulted to dose TPN.  Glucose / Insulin: hx DM on glipizide PTA.  BG < 180,  A1C 6.6, received 6 units mSSI/24 hr  Electrolytes: suspect 1/27 0500 labs might be contaminated with TPN > repeated with improvement:  Na 134, K 3.7 (received 30 IV, goal >/= 4), Mag 2.2 (received 2g, goal >/= 2), CoCa 9.1, others WNL Renal: SCr 1.05, BUN WNL Hepatic: LFTs WNL, tbili 1.5, TG 173 Intake / Output; MIVF: UOP 0.4 ml/kg/hr, NG 669m, drain 1666m LR 125 to 90 ml/hr AT 1800, net +10.9L GI Imaging:  1/23 CT - Marked severity colitis with adjacent abscess  GI Surgeries / Procedures: 1/24 s/p ex lap with resection of transverse colon, drainage of mesenteric abscess, resection of proximal jejunum with SB anastomosis and diverting R colostomy and long Hartman's pouch   Central access: PICC 02/12/22 TPN start date: 02/12/22  Nutritional Goals: RD Assessment: pending Estimated Needs Total Energy Estimated Needs: 2300-2500 Total Protein Estimated Needs: 120-135 grams Total Fluid Estimated Needs: >2 L/day   Goal TPN  Rate at  100 ml/hr provides 132g protein, 324g dextrose, 2350 kcal   Current Nutrition:  NPO + TPN  Plan:  Advance TPN to 70 ml/hr at 1800, provides 92g protein and 1645 kcal, meeting 70% of estimated needs  Electrolytes in TPN: Increase Na 100 mEq/L, K 50 mEq/L, Mg 5 mEq/L, Phos 203m/L; Continue Ca 3 mEq/L, Cl:Ac 1:1 Add standard MVI and trace elements to TPN Continue moderate SSI Q4H and adjust as needed  Add Thiamine '100mg'$  x3d (1/26 - 1/28)  Reduce LR to 50 mL/hr at 1800, additional fluid per team  Monitor TPN labs daily until stable at goal then on Mon/Thurs   LydBenetta SparharmD, BCPS, BCCClaytonarmacist  Please check AMION for all [facility] [place] numbers After 10:00 PM, call [place]

## 2022-02-13 NOTE — Progress Notes (Signed)
PT Cancellation Note  Patient Details Name: Patrick [name] MRN: [id number] DOB: September 05, 1970   Cancelled Treatment:    Reason Eval/Treat Not Completed: Patient declined, no reason specified.  I really want a day off today, don't feel well. 02/13/2022  Ginger Carne., PT Acute Rehabilitation Services [phone]  (office)   [name] [name] 02/13/2022, 3:01 PM

## 2022-02-13 NOTE — Progress Notes (Signed)
Patient ID: Patrick [name], male   DOB: [birth year], 52 y.o.   MRN: [id number] 3 Days Post-Op    Subjective: Minimal from ostomy Redraw labs ROS negative except as listed above. Objective: Vital signs in last 24 hours: Temp:  [98.3 F (36.8 C)-99.6 F (37.6 C)] 99.3 F (37.4 C) (01/27 0400) Pulse Rate:  [111-128] 122 (01/27 0600) Resp:  [15-27] 20 (01/27 0600) BP: (90-118)/(59-76) 111/67 (01/27 0600) SpO2:  [87 %-96 %] 95 % (01/27 0600) Weight:  [110.1 kg-112.8 kg] 112.8 kg (01/27 0500) Last BM Date :  (pta)  Intake/Output from previous day: 01/26 0701 - 01/27 0700 In: 3896 [I.V.:2898.5; Blood:371.7; IV Piggyback:625.8] Out: 1875 [Urine:1065; Emesis/NG output:650; Drains:160] Intake/Output this shift: No intake/output data recorded.  General appearance: alert and cooperative Resp: clear to auscultation bilaterally GI: vac midline, ostomy minimal out  Lab Results: CBC  Recent Labs    02/12/22 0240 02/13/22 0500  WBC 10.6* 11.7*  HGB 8.0* 7.3*  HCT 25.5* 23.4*  PLT 493* 424*   BMET Recent Labs    02/12/22 0240 02/13/22 0500  NA 139 133*  K 3.7 5.5*  CL 105 102  CO2 23 21*  GLUCOSE 107* 435*  BUN 20 14  CREATININE 1.06 0.92  CALCIUM 7.5* 7.4*   PT/INR No results for input(s): "LABPROT", "INR" in the last 72 hours. ABG Recent Labs    01/30/2022 1200  PHART 7.329*  HCO3 19.7*    Studies/Results: [place] EKG SITE RITE  Result Date: 02/12/2022 If Site Rite image not attached, placement could not be confirmed due to current cardiac rhythm.   Anti-infectives: Anti-infectives (From admission, onward)    Start     Dose/Rate Route Frequency Ordered Stop   02/17/2022 1100  piperacillin-tazobactam (ZOSYN) IVPB 3.375 g        3.375 g 100 mL/hr over 30 Minutes Intravenous  Once 01/19/2022 1056 02/08/2022 1230   02/12/2022 0200  piperacillin-tazobactam (ZOSYN) IVPB 3.375 g        3.375 g 12.5 mL/hr over 240 Minutes Intravenous Every 8 hours 01/19/2022 2322 02/17/22 0159    02/13/2022 1900  piperacillin-tazobactam (ZOSYN) IVPB 3.375 g        3.375 g 100 mL/hr over 30 Minutes Intravenous  Once 02/01/2022 1857 02/08/2022 2012       Assessment/Plan: 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, proximal small bowel with ischemic necrotic proximal jejunum with perforation fecal contamination with abscess  POD3 S/P Exploratory laparotomy with resection of transverse colon, drainage of mesenteric abscess, resection of proximal jejunum with small bowel anastomosis and 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

## 2022-02-14 ENCOUNTER — Inpatient Hospital Stay (HOSPITAL_COMMUNITY): Payer: BC Managed Care – PPO

## 2022-02-14 LAB — CULTURE, BLOOD (ROUTINE X 2)
Culture: NO GROWTH
Culture: NO GROWTH
Special Requests: ADEQUATE
Special Requests: ADEQUATE

## 2022-02-14 LAB — MAGNESIUM: Magnesium: 2.1 mg/dL (ref 1.7–2.4)

## 2022-02-14 LAB — GLUCOSE, CAPILLARY
Glucose-Capillary: 179 mg/dL — ABNORMAL HIGH (ref 70–99)
Glucose-Capillary: 188 mg/dL — ABNORMAL HIGH (ref 70–99)
Glucose-Capillary: 189 mg/dL — ABNORMAL HIGH (ref 70–99)
Glucose-Capillary: 200 mg/dL — ABNORMAL HIGH (ref 70–99)
Glucose-Capillary: 211 mg/dL — ABNORMAL HIGH (ref 70–99)
Glucose-Capillary: 214 mg/dL — ABNORMAL HIGH (ref 70–99)
Glucose-Capillary: 233 mg/dL — ABNORMAL HIGH (ref 70–99)

## 2022-02-14 LAB — BASIC METABOLIC PANEL
Anion gap: 12 (ref 5–15)
BUN: 16 mg/dL (ref 6–20)
CO2: 22 mmol/L (ref 22–32)
Calcium: 7.3 mg/dL — ABNORMAL LOW (ref 8.9–10.3)
Chloride: 104 mmol/L (ref 98–111)
Creatinine, Ser: 1.18 mg/dL (ref 0.61–1.24)
GFR, Estimated: >60 mL/min (ref >60)
Glucose, Bld: 180 mg/dL — ABNORMAL HIGH (ref 70–99)
Potassium: 3.8 mmol/L (ref 3.5–5.1)
Sodium: 138 mmol/L (ref 135–145)

## 2022-02-14 LAB — CBC
HCT: 28.4 % — ABNORMAL LOW (ref 39.0–52.0)
Hemoglobin: 8.4 g/dL — ABNORMAL LOW (ref 13.0–17.0)
MCH: 24 pg — ABNORMAL LOW (ref 26.0–34.0)
MCHC: 29.6 g/dL — ABNORMAL LOW (ref 30.0–36.0)
MCV: 81.1 fL (ref 80.0–100.0)
Platelets: 459 10*3/uL — ABNORMAL HIGH (ref 150–400)
RBC: 3.5 MIL/uL — ABNORMAL LOW (ref 4.22–5.81)
RDW: 16.3 % — ABNORMAL HIGH (ref 11.5–15.5)
WBC: 16.6 10*3/uL — ABNORMAL HIGH (ref 4.0–10.5)
nRBC: 0.1 % (ref 0.0–0.2)

## 2022-02-14 LAB — PHOSPHORUS: Phosphorus: 3 mg/dL (ref 2.5–4.6)

## 2022-02-14 MED ORDER — ENOXAPARIN SODIUM 60 MG/0.6ML IJ SOSY
60.0000 mg | PREFILLED_SYRINGE | INTRAMUSCULAR | Status: DC
  Administered 2022-02-14 – 2022-02-15 (×2): 60 mg via SUBCUTANEOUS
  Filled 2022-02-14 (×3): qty 0.6

## 2022-02-14 MED ORDER — LACTATED RINGERS IV SOLN: INTRAVENOUS | Status: DC

## 2022-02-14 MED ORDER — TRAVASOL 10 % IV SOLN
INTRAVENOUS | Status: AC
  Filled 2022-02-14: qty 1320

## 2022-02-14 NOTE — Care Management (Signed)
This RN noticed edges of patients abdominal wound were concerning for possible infection. MD Grandville Silos notified; MD will assess 02/15/2022 during wound vac dressing change.   Daisey Must, RN

## 2022-02-14 NOTE — Progress Notes (Signed)
PHARMACY - TOTAL PARENTERAL NUTRITION CONSULT NOTE  Indication: Prolonged ileus  Patient Measurements: Height: 6' (182.9 cm) Weight: 112.9 kg (248 lb 14.4 oz) IBW/kg (Calculated) : 77.6 TPN AdjBW (KG): 86.6 Body mass index is 33.76 kg/m.  Assessment:  53 YOM presented with abdominal pain for 4 weeks, found to have retroperitoneal mass with necrotic jejunum, perforated fecal contamination and abscess.  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  Patient has been NPO for 4 days since admission.  Patient/wife report he wasn't eating well for 4 weeks PTA and basically on a liquid diet. He endorses a ~10 lb weight loss during this time. Previously, he typically ate 2 meals a day (chicken, vegetables, beans and fruits) and 1 snack. Pharmacy consulted to dose TPN.  Glucose / Insulin: hx DM on glipizide PTA.  BG 132-200,  A1C 6.6, received 15 units mSSI/24 hr  Electrolytes: CoCa 9.1, others WNL Renal: SCr 1.18 (BL 1), BUN WNL Hepatic: LFTs WNL, tbili 1.5, TG 173 Intake / Output; MIVF: LR 62m/hr ; UOP 0.3 ml/kg/hr, NG 18563m drain 315 mL, + flatus, colostomy 1m87mnet +10.7L GI Imaging:  1/23 CT - Marked severity colitis with adjacent abscess  GI Surgeries / Procedures: 1/24 s/p ex lap with resection of transverse colon, drainage of mesenteric abscess, resection of proximal jejunum with SB anastomosis and diverting R colostomy and long Hartman's pouch   Central access: PICC 02/12/22 TPN start date: 02/12/22  Nutritional Goals: RD Assessment: pending Estimated Needs Total Energy Estimated Needs: 2300-2500 Total Protein Estimated Needs: 120-135 grams Total Fluid Estimated Needs: >2 L/day   Goal TPN  Rate at 100 ml/hr provides 132g protein, 324g dextrose, 2350 kcal   Current Nutrition:  TPN 1/28 planning for CLD per note  Plan:  Advance TPN  to goal 100 ml/hr at 1800  Electrolytes in TPN:(adjusted for increased bag volume) Increase K 45 mEq/L, Cl:Ac 1:2; Continue  Na 100 mEq/L, Ca 3 mEq/L, Mg 4 mEq/L, Phos 15 mmol/L Add standard MVI and trace elements to TPN Continue moderate SSI Q4H and adjust as needed  Add insulin 20 units to TPN  Add thiamine '100mg'$  x3d (1/26 - 1/28)  Reduce LR to 20 mL/hr at 1800, additional fluid per team  Monitor TPN labs daily until stable at goal then on Mon/Thurs  F/u PO intake and wean TPN as able   LydBenetta SparharmD, BCPS, BCCP Clinical Pharmacist  Please check AMION for all [facility] numbers After 10:00 PM, call [place]

## 2022-02-14 NOTE — Progress Notes (Signed)
Patient ID: Patrick [name], male   DOB: [birth year], 52 y.o.   MRN: [id number] 4 Days Post-Op    Subjective: Passing gas in ostomy and from below ROS negative except as listed above. Objective: Vital signs in last 24 hours: Temp:  [98 F (36.7 C)-100.6 F (38.1 C)] 98 F (36.7 C) (01/28 0000) Pulse Rate:  [117-130] 125 (01/28 0600) Resp:  [12-29] 18 (01/28 0600) BP: (94-119)/(58-76) 101/61 (01/28 0600) SpO2:  [90 %-100 %] 90 % (01/28 0600) Weight:  [112.9 kg] 112.9 kg (01/28 0451) Last BM Date :  (pta)  Intake/Output from previous day: 01/27 0701 - 01/28 0700 In: 2840.4 [I.V.:2690.6; IV Piggyback:149.9] Out: 3070 [Urine:900; Emesis/NG output:1850; Drains:315; Stool:5] Intake/Output this shift: No intake/output data recorded.  General appearance: alert and cooperative Resp: clear to auscultation bilaterally GI: soft, VAC, stoma with little output Extremities: calves soft  Lab Results: CBC  Recent Labs    02/13/22 0500 02/14/22 0445  WBC 11.7* 16.6*  HGB 7.3* 8.4*  HCT 23.4* 28.4*  PLT 424* 459*   BMET Recent Labs    02/13/22 0856 02/14/22 0445  NA 134* 138  K 3.7 3.8  CL 102 104  CO2 23 22  GLUCOSE 150* 180*  BUN 15 16  CREATININE 1.05 1.18  CALCIUM 7.3* 7.3*   PT/INR No results for input(s): "LABPROT", "INR" in the last 72 hours. ABG No results for input(s): "PHART", "HCO3" in the last 72 hours.  Invalid input(s): "PCO2", "PO2"  Studies/Results: DG Abd 1 View  Result Date: 02/14/2022 CLINICAL DATA:  53 year old male status post NG tube placement. Status post exploratory laparotomy postoperative day 4, retroperitoneal mass with invasion of proximal small bowel, transverse colon, perforation and fecal contamination. EXAM: ABDOMEN - 1 VIEW COMPARISON:  CT Abdomen and Pelvis 02/06/2022. FINDINGS: Prior CABG. There is evidence of a linear enteric tube in the midline projecting over the thoracic spine to the T7-T8 level. Tip is just at the level of the  diaphragm. Side hole is not included. Left upper quadrant surgical clips are new. Paucity of bowel gas in the upper abdomen. Left lung base opacity also appears increased. IMPRESSION: 1. Enteric tube projects over the midline thoracic spine, tip at the level of the diaphragm. This should probably be advanced about 12 cm to allow for side hole placement within the stomach. Recommend repeat portable KUB at that time. 2. Recent postoperative changes in the left upper quadrant. Increased left lung base opacity since 02/06/2022. Electronically Signed   By: Genevie Ann M.D.   On: 02/14/2022 05:32   [place] EKG SITE RITE  Result Date: 02/12/2022 If Site Rite image not attached, placement could not be confirmed due to current cardiac rhythm.   Anti-infectives: Anti-infectives (From admission, onward)    Start     Dose/Rate Route Frequency Ordered Stop   02/08/2022 1100  piperacillin-tazobactam (ZOSYN) IVPB 3.375 g        3.375 g 100 mL/hr over 30 Minutes Intravenous  Once 02/01/2022 1056 01/19/2022 1230   02/08/2022 0200  piperacillin-tazobactam (ZOSYN) IVPB 3.375 g        3.375 g 12.5 mL/hr over 240 Minutes Intravenous Every 8 hours 02/15/2022 2322 02/17/22 0159   02/06/2022 1900  piperacillin-tazobactam (ZOSYN) IVPB 3.375 g        3.375 g 100 mL/hr over 30 Minutes Intravenous  Once 02/13/2022 1857 02/16/2022 2012       Assessment/Plan: 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, proximal small bowel with ischemic necrotic proximal  jejunum with perforation fecal contamination with abscess  POD4 S/P Exploratory laparotomy with resection of transverse colon, drainage of mesenteric abscess, resection of proximal jejunum with small bowel anastomosis and 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  - AROBF, D/C NGT, clears from the floor - TNA - JP SS - VAC to midline change MWF - WOC following for new ostomy - continue IV abx - surgical path is pending  AKI - improved   FEN: D/C NGT and  allow clears from floor, IVF@ 125 cc/h, TNA VTE: LMWH ID: Zosyn 1/23>>   T2DM with hx of foot infection s/p 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

## 2022-02-15 ENCOUNTER — Inpatient Hospital Stay (HOSPITAL_COMMUNITY): Payer: BC Managed Care – PPO

## 2022-02-15 ENCOUNTER — Encounter (HOSPITAL_COMMUNITY): Payer: Self-pay

## 2022-02-15 DIAGNOSIS — R1900 Intra-abdominal and pelvic swelling, mass and lump, unspecified site: Secondary | ICD-10-CM | POA: Diagnosis not present

## 2022-02-15 DIAGNOSIS — J9601 Acute respiratory failure with hypoxia: Secondary | ICD-10-CM

## 2022-02-15 DIAGNOSIS — R6510 Systemic inflammatory response syndrome (SIRS) of non-infectious origin without acute organ dysfunction: Secondary | ICD-10-CM | POA: Diagnosis not present

## 2022-02-15 DIAGNOSIS — E43 Unspecified severe protein-calorie malnutrition: Secondary | ICD-10-CM

## 2022-02-15 DIAGNOSIS — E8770 Fluid overload, unspecified: Secondary | ICD-10-CM | POA: Diagnosis not present

## 2022-02-15 DIAGNOSIS — N179 Acute kidney failure, unspecified: Secondary | ICD-10-CM

## 2022-02-15 DIAGNOSIS — R7989 Other specified abnormal findings of blood chemistry: Secondary | ICD-10-CM

## 2022-02-15 LAB — COMPREHENSIVE METABOLIC PANEL
ALT: 16 U/L (ref 0–44)
AST: 33 U/L (ref 15–41)
Albumin: 1.6 g/dL — ABNORMAL LOW (ref 3.5–5.0)
Alkaline Phosphatase: 75 U/L (ref 38–126)
Anion gap: 8 (ref 5–15)
BUN: 24 mg/dL — ABNORMAL HIGH (ref 6–20)
CO2: 25 mmol/L (ref 22–32)
Calcium: 7.3 mg/dL — ABNORMAL LOW (ref 8.9–10.3)
Chloride: 101 mmol/L (ref 98–111)
Creatinine, Ser: 1.51 mg/dL — ABNORMAL HIGH (ref 0.61–1.24)
GFR, Estimated: 56 mL/min — ABNORMAL LOW (ref >60)
Glucose, Bld: 210 mg/dL — ABNORMAL HIGH (ref 70–99)
Potassium: 3.8 mmol/L (ref 3.5–5.1)
Sodium: 134 mmol/L — ABNORMAL LOW (ref 135–145)
Total Bilirubin: 6.2 mg/dL — ABNORMAL HIGH (ref 0.3–1.2)
Total Protein: 4.5 g/dL — ABNORMAL LOW (ref 6.5–8.1)

## 2022-02-15 LAB — CBC
HCT: 25.2 % — ABNORMAL LOW (ref 39.0–52.0)
Hemoglobin: 7.8 g/dL — ABNORMAL LOW (ref 13.0–17.0)
MCH: 24.7 pg — ABNORMAL LOW (ref 26.0–34.0)
MCHC: 31 g/dL (ref 30.0–36.0)
MCV: 79.7 fL — ABNORMAL LOW (ref 80.0–100.0)
Platelets: 378 10*3/uL (ref 150–400)
RBC: 3.16 MIL/uL — ABNORMAL LOW (ref 4.22–5.81)
RDW: 16.5 % — ABNORMAL HIGH (ref 11.5–15.5)
WBC: 16.1 10*3/uL — ABNORMAL HIGH (ref 4.0–10.5)
nRBC: 0.1 % (ref 0.0–0.2)

## 2022-02-15 LAB — HEPATIC FUNCTION PANEL
ALT: 18 U/L (ref 0–44)
AST: 34 U/L (ref 15–41)
Albumin: 1.6 g/dL — ABNORMAL LOW (ref 3.5–5.0)
Alkaline Phosphatase: 75 U/L (ref 38–126)
Bilirubin, Direct: 4.1 mg/dL — ABNORMAL HIGH (ref 0.0–0.2)
Indirect Bilirubin: 1.8 mg/dL — ABNORMAL HIGH (ref 0.3–0.9)
Total Bilirubin: 5.9 mg/dL — ABNORMAL HIGH (ref 0.3–1.2)
Total Protein: 4.4 g/dL — ABNORMAL LOW (ref 6.5–8.1)

## 2022-02-15 LAB — ECHOCARDIOGRAM COMPLETE
Area-P 1/2: 2.33 cm2
Est EF: 50
Height: 72 in
S' Lateral: 3.7 cm
Weight: 3964.75 oz

## 2022-02-15 LAB — GLUCOSE, CAPILLARY
Glucose-Capillary: 186 mg/dL — ABNORMAL HIGH (ref 70–99)
Glucose-Capillary: 205 mg/dL — ABNORMAL HIGH (ref 70–99)
Glucose-Capillary: 207 mg/dL — ABNORMAL HIGH (ref 70–99)
Glucose-Capillary: 214 mg/dL — ABNORMAL HIGH (ref 70–99)
Glucose-Capillary: 228 mg/dL — ABNORMAL HIGH (ref 70–99)
Glucose-Capillary: 238 mg/dL — ABNORMAL HIGH (ref 70–99)

## 2022-02-15 LAB — TRIGLYCERIDES: Triglycerides: 174 mg/dL — ABNORMAL HIGH (ref <150)

## 2022-02-15 LAB — URINALYSIS, ROUTINE W REFLEX MICROSCOPIC
Glucose, UA: NEGATIVE mg/dL
Ketones, ur: NEGATIVE mg/dL
Leukocytes,Ua: NEGATIVE
Nitrite: NEGATIVE
Protein, ur: 30 mg/dL — AB
Specific Gravity, Urine: 1.014 (ref 1.005–1.030)
pH: 5 (ref 5.0–8.0)

## 2022-02-15 LAB — MAGNESIUM: Magnesium: 2.1 mg/dL (ref 1.7–2.4)

## 2022-02-15 LAB — PHOSPHORUS: Phosphorus: 2.9 mg/dL (ref 2.5–4.6)

## 2022-02-15 LAB — CREATININE, URINE, RANDOM: Creatinine, Urine: 114 mg/dL

## 2022-02-15 LAB — SODIUM, URINE, RANDOM: Sodium, Ur: 14 mmol/L

## 2022-02-15 LAB — BRAIN NATRIURETIC PEPTIDE: B Natriuretic Peptide: 120.2 pg/mL — ABNORMAL HIGH (ref 0.0–100.0)

## 2022-02-15 LAB — PROCALCITONIN: Procalcitonin: 2.93 ng/mL

## 2022-02-15 MED ORDER — PIPERACILLIN-TAZOBACTAM 3.375 G IVPB
3.3750 g | Freq: Three times a day (TID) | INTRAVENOUS | Status: AC
  Administered 2022-02-15 – 2022-02-16 (×5): 3.375 g via INTRAVENOUS
  Filled 2022-02-15 (×5): qty 50

## 2022-02-15 MED ORDER — POTASSIUM CHLORIDE 10 MEQ/100ML IV SOLN
10.0000 meq | INTRAVENOUS | Status: DC
  Administered 2022-02-15: 10 meq via INTRAVENOUS
  Filled 2022-02-15 (×4): qty 100

## 2022-02-15 MED ORDER — ZINC CHLORIDE 1 MG/ML IV SOLN
INTRAVENOUS | Status: AC
  Filled 2022-02-15: qty 1320

## 2022-02-15 MED ORDER — POTASSIUM CHLORIDE 10 MEQ/100ML IV SOLN
10.0000 meq | INTRAVENOUS | Status: AC
  Administered 2022-02-15 (×3): 10 meq via INTRAVENOUS

## 2022-02-15 MED ORDER — OXYCODONE HCL 5 MG PO TABS
5.0000 mg | ORAL_TABLET | ORAL | Status: DC | PRN
  Administered 2022-02-18 – 2022-02-19 (×2): 10 mg via ORAL
  Filled 2022-02-15 (×3): qty 2

## 2022-02-15 MED ORDER — ALBUMIN HUMAN 25 % IV SOLN
25.0000 g | Freq: Once | INTRAVENOUS | Status: AC
  Administered 2022-02-15: 25 g via INTRAVENOUS
  Filled 2022-02-15: qty 100

## 2022-02-15 MED ORDER — ACETAMINOPHEN 325 MG PO TABS
650.0000 mg | ORAL_TABLET | ORAL | Status: DC | PRN
  Administered 2022-02-15 – 2022-02-19 (×9): 650 mg via ORAL
  Filled 2022-02-15 (×9): qty 2

## 2022-02-15 MED ORDER — FUROSEMIDE 10 MG/ML IJ SOLN
20.0000 mg | Freq: Two times a day (BID) | INTRAMUSCULAR | Status: AC
  Administered 2022-02-15 – 2022-02-16 (×2): 20 mg via INTRAVENOUS
  Filled 2022-02-15 (×2): qty 2

## 2022-02-15 NOTE — Consult Note (Addendum)
[place] Nurse wound follow up Visit made with Saverio Danker, PA-C [facility] Wound type: surgical post op exp lap 02/03/2022 Wound bed: 50% red 40% yellow slough 10% subcutaneous tissue  Drainage (amount, consistency, odor) minimal serosanguinous/tan drainage noted in NPWT cannister  Periwound: noted to have erythema circumferential around wound extending out approximately 0.5 cms Dressing procedure/placement/frequency: After examining wound Saverio Danker, PA-C made determination to stop NPWT at this time and change to wet to dry dressing twice daily; this RN applied wet to dry dressing at this visit.   Patient tolerated above well.     [place] Nurse ostomy follow up Stoma type/location: RUQ   Stomal assessment/size: 1 3/4" x 7/8", oval, skin level,  yellow mucosal sloughing covering stoma, was able to be removed revealing mucocutaneous separation from  6 to 9 o'clock Peristomal assessment: erythema surrounding  Treatment options for stomal/peristomal skin: Did crust the area with antifungal powder and skin prep prior to applying new skin barrier; 2" skin barrier ring applied as well  Output minimal soft yellow brown stool in bag  Ostomy pouching: 1pc convex Kellie Simmering [phone]) Education provided: Education provided for wife and patient at this visit.  Wife was able to demonstrate how to use the lock and roll system to empty pouch, cleaned spout with wick and closed pouch.  Demonstrated crusting technique with wife, ostomy powder ordered for bedside.  Wife able to cut new skin barrier, shape barrier ring to fit stoma and apply new pouching system to stoma.  Patient and wife both feel confident in care of ostomy.    Enrolled patient in [place] Start Discharge program: Yes  WOC will continue to follow this patient and family for further support and ostomy education.  Will reapply NPWT if reordered by [facility].    Thank you,     Ha Shannahan MSN, RN-BC, [employer]

## 2022-02-15 NOTE — Progress Notes (Addendum)
PHARMACY - TOTAL PARENTERAL NUTRITION CONSULT NOTE  Indication: Prolonged ileus  Patient Measurements: Height: 6' (182.9 cm) Weight: 112.4 kg (247 lb 12.8 oz) IBW/kg (Calculated) : 77.6 TPN AdjBW (KG): 86.6 Body mass index is 33.61 kg/m.  Assessment:  20 YOM presented with abdominal pain for 4 weeks, found to have retroperitoneal mass with necrotic jejunum, perforated fecal contamination and abscess. 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 Patient has been NPO for 4 days since admission. Patient/wife report he wasn't eating well for 4 weeks PTA and basically on a liquid diet. He endorses a ~10 lb weight loss during this time. Previously, he typically ate 2 meals a day (chicken, vegetables, beans and fruits) and 1 snack. Pharmacy consulted to dose TPN.  Glucose / Insulin: hx DM (A1C 6.6) on glipizide PTA. CBGs 189-211, received 26 units mSSI/24 hr  Electrolytes: Na 134, others WNL Renal: AKI - SCr up to 1.51 (BL 1), BUN up to 24 Hepatic: LFTs WNL, Tbili 3.4>>6.2, TG 174 stable, albumin 1.6 S/p thiamine x 3 days (1/26>1/28) Intake / Output; MIVF: MIVF: LR 56m/hr ; UOP 0.3 ml/kg/hr, NG 8056m drain 2954mcolostomy 5ml37met +12.7L (increased) GI Imaging: 1/23 CT - Marked severity colitis with adjacent abscess  GI Surgeries / Procedures: 1/24 s/p ex lap with resection of transverse colon, drainage of mesenteric abscess, resection of proximal jejunum with SB anastomosis and diverting R colostomy and long Hartman's pouch   Central access: PICC 02/12/22 TPN start date: 02/12/22  Nutritional Goals per RD assessment: Estimated Needs Total Energy Estimated Needs: 2300-2500 Total Protein Estimated Needs: 120-135 grams Total Fluid Estimated Needs: >2 L/day   Goal TPN rate is 100 ml/hr, which will provide 132g protein, 324g dextrose, 2350 kcal in  a 24-hr period.  Current Nutrition:  TPN CLD 1/29  Plan:  Continue TPN at goal rate 100 ml/hr at 1800  Electrolytes in TPN: Na 100 mEq/L, K 45 mEq/L, Ca 3 mEq/L, Mg 4 mEq/L, Phos 15 mmol/L; Cl:Ac 1:2 Add standard MVI to TPN Remove standard trace elements with Tbili now >6. Add back selenium 60mc56minc '5mg'$ , and chromium 10mcg90mTPN bag. Continue moderate SSI Q4H and adjust as needed  Increase regular insulin to 30 units added to TPN bag  Stop LR 20 mL/hr per discussion with Surgery team Monitor TPN labs daily until stable, then standard on Mon/Thurs - watch SCr trend closely and adjust electrolytes as needed F/u volume status - per Surgery team, likely will give Lasix/albumin today F/u PO intake and ability advance diet as appropriate   Patrick [name] Arturo MortonmD, BCPS Please check AMION for all [facility] numbers Clinical Pharmacist 02/15/2022 7:43 AM

## 2022-02-15 NOTE — Progress Notes (Signed)
Pt getting blood drawn, will attempt later.

## 2022-02-15 NOTE — Progress Notes (Signed)
  Echocardiogram 2D Echocardiogram has been performed.  Patrick [name] 02/15/2022, 3:48 PM

## 2022-02-15 NOTE — Consult Note (Addendum)
Initial Consultation Note   Patient: Patrick [name]:[id number] DOB: [birth year] PCP: Caryl Bis, MD DOA: 01/29/2022 DOS: the patient was seen and examined on 02/15/2022 Primary service: Edison Pace, Md, MD   Reason for consult: Medical management  Assessment/Plan: 52 year old male with history of abdominal pain after being initially found to have concern for pneumatosis and possible pneumoperitoneum on outpatient CT.  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 -Per surgery  SIRS Patient has continued to have fevers up to 101 F with tachycardia.  Question possibility of infection postoperatively such as recurrent abscess.  Previous blood cultures from 1/23 have been negative and MRSA screen negative from 1/25.  The possibility of pneumonia is also on the differential.  Orders have been placed to start patient on empiric antibiotics of Zosyn. -Check blood cultures -Check Procalcitonin -Check urinalysis as patient has had Foley catheter in place  Acute hypoxic respiratory failure Patient noted to be hypoxic down to 84% on room air requiring 2 L nasal cannula oxygen maintain O2 saturations.  Chest x-ray had noted low lung volumes with bibasilar lung densities concerning for atelectasis and/or consolidation for which pleural effusions cannot be excluded. -Continuous pulse oximetry with oxygen maintain O2 saturation greater than 90%. -Encourage use of incentive spirometry -Follow-up procalcitonin    Acute kidney injury Creatinine today noted to be 1.51 with BUN 24.  Baseline creatinine  noted to be around 1-1.1.  Symptoms previously had been thought to be prerenal in nature and improved with IV fluids.    -Check urine sodium and urine creatinine(FeNa 0.1% suggesting prerenal, but currently think symptoms may be secondary to heart failure.) -Hold nephrotoxic agents -Monitor kidney function with IV diuresis  Elevated BNP Acute.  On physical exam patient with at least 2+ pitting bilateral lower extremity edema.  BNP noted be elevated up to 120.2.  Patient has been given albumin and ordered Lasix 20 mg IV. -Check echocardiogram -Reassess and determine need of further IV diuresis  Hyperbilirubinemia Acute.Total bilirubin trending up to 6.2 today with direct bilirubin elevated 4.1., but had been 3.4 on 1/27.  Patient's other liver enzymes were within normal limits -Follow-up CT scan of the abdomen and pelvis  Coronary artery disease Patient does not report any complaints of chest pain.  Prior history of CABG back in 2022  Severe protein calorie malnutrition Patient currently on TPN.   TRH will continue to follow the patient.  HPI: Patrick [name] is a 52 y.o. male with past medical history of CAD s/p CABG, DM type II, nephrolithiasis,  history of osteomyelitis, and  obesity who presented 1/23 with complaints of abdominal pain, anorexia, and weight loss over the last 4 weeks.  He had been directed to come to the emergency department after having CT scan in the outpatient setting that found extensive small bowel ischemia with pneumatosis of the left upper quadrant and surrounding inflammatory changes and probable adjacent pneumoperitoneum.  Repeat CT scan noted concern for intra-abdominal mass measuring 12.5 cm x 15.53 cm x 17.8 cm at the level of the transverse colon.  Patient has been started on IV fluids and empiric antibiotics.  Patient went to the operating room on  1/24 for exploratory laparotomy with resection of transverse colon, drainage of mesenteric abscess, resection of  proximal jejunum with small bowel anastomosis, diverting colostomy, and long Hartman's pouch.  Postoperatively patient had developed hypotension requiring vasopressors.  Critical care has been consulted and patient was temporarily on vasopressors.  Patient had been noted to have an acute kidney injury for which symptoms improved with IV fluids and albumin.  Labs today significant for WBC 16.1, hemoglobin 7.8, sodium 134, BUN 24, creatinine 1.51, glucose 210, albumin 1.6, total bilirubin 6.2 with direct bilirubin 4.1.  Chest x-ray today noted low lung volumes with bibasilar lung densities concerning for atelectasis and consolidation.  Patient has had a dry cough and normally is not on oxygen at baseline.  He has no significant history of alcohol or tobacco abuse.   Review of Systems: As mentioned in the history of present illness. All other systems reviewed and are negative. Past Medical History:  Diagnosis Date   Anxiety    Arthritis    CAD (coronary artery disease)    s/p quad CABG   Chronic osteomyelitis of left foot ([place]) 12/20/2018   Chronic osteomyelitis of right foot ([place]) 12/20/2018   Diabetes mellitus without complication ([place])    Diabetic foot infection ([place]) 12/20/2018   Diabetic neuropathy ([place]) 12/20/2018   History of kidney stones    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   Morbid obesity ([place]) 12/20/2018   MSSA (methicillin susceptible Staphylococcus aureus) infection 12/20/2018   PONV (postoperative nausea and vomiting)    Past Surgical History:  Procedure Laterality Date   AMPUTATION Right 05/10/2018   Procedure: PARTIAL 5TH RAY AMPUTATION RIGHT FOOT;  Surgeon: Caprice Beaver, DPM;  Location: [facility];  Service: Podiatry;  Laterality: Right;   AMPUTATION Left 10/26/2018   Procedure: PARTIAL 1ST RAY AMPUTATION FOOT;  Surgeon: Caprice Beaver, DPM;  Location: [facility];  Service: Podiatry;  Laterality: Left;   APPLICATION OF WOUND VAC Right 05/10/2018    Procedure: APPLICATION OF WOUND VAC RIGHT FOOT;  Surgeon: Caprice Beaver, DPM;  Location: [facility];  Service: Podiatry;  Laterality: Right;   APPLICATION OF WOUND VAC  01/23/2022   Procedure: APPLICATION OF WOUND VAC;  Surgeon: Erroll Luna, MD;  Location: [place];  Service: General;;   COLECTOMY WITH COLOSTOMY CREATION/HARTMANN PROCEDURE N/A 02/12/2022   ex-lap, resection of transverse colon with long Hartman's pouch, drainage of mesenteric abscess, resection of proximal jejunum with anastomosis - Dr. Brantley Stage   CORONARY ARTERY BYPASS GRAFT  04/01/2020   CABG x 5 (LIMA-LAD, RCA-PDA, VG-Diag, VG-OM, SVG-RPL) and LAA Clip at [facility] in [place] with Sande Brothers, MD   CORONARY ARTERY BYPASS GRAFT     x4   LAPAROTOMY N/A 01/26/2022   Procedure: EXPLORATORY LAPAROTOMY WITH BOWEL RESECTIOM;  Surgeon: Erroll Luna, MD;  Location: [place];  Service: General;  Laterality: N/A;   VASECTOMY     WOUND DEBRIDEMENT Right 05/10/2018   Procedure: DEBRIDEMENT PUNCTURE WOUND;  Surgeon: Caprice Beaver, DPM;  Location: [facility];  Service: Podiatry;  Laterality: Right;   WOUND DEBRIDEMENT Left 05/10/2018   Procedure: DEBRIDEMENT ULCERATION LEFT FOOT;  Surgeon: Caprice Beaver, DPM;  Location: [facility];  Service: Podiatry;  Laterality: Left;   WOUND DEBRIDEMENT Left 10/26/2018   Procedure: DEBRIDEMENT ULCER LEFT FOOT;  Surgeon: Caprice Beaver, DPM;  Location: [facility];  Service: Podiatry;  Laterality: Left;   Social History:  reports that he has never smoked. He quit smokeless tobacco use about 32 years ago.  His smokeless tobacco use included chew. He reports that he does not currently use alcohol. He reports that he does not use drugs.  Allergies  Allergen Reactions   Morphine     hallucinations    No family history on file.  Prior to Admission medications   Medication Sig Start Date End Date Taking? Authorizing Provider  APPLE CIDER VINEGAR PO Take 450 mg by mouth daily.   Yes [provider]  aspirin EC (BAYER ASPIRIN EC LOW DOSE) 81 MG tablet Take 81 mg by mouth daily.   Yes [provider]  atorvastatin (LIPITOR) 20 MG tablet Take 20 mg by mouth every evening.   Yes [provider]  escitalopram (LEXAPRO) 20 MG tablet Take 20 mg by mouth daily. 01/04/22  Yes [provider]  folic acid (FOLVITE) 1 MG tablet Take 1 tablet by mouth daily. 12/04/21  Yes [provider]  glipiZIDE (GLUCOTROL) 5 MG tablet Take 5 mg by mouth 2 (two) times daily before a meal. 05/22/20  Yes [provider]  omeprazole (PRILOSEC) 20 MG capsule Take 20 mg by mouth daily. 01/14/22  Yes [provider]    Physical Exam: Vitals:   02/15/22 0700 02/15/22 0800 02/15/22 0900 02/15/22 1000  BP: 101/69 109/61 (!) 103/59 100/62  Pulse: (!) 127 (!) 118 (!) 121 (!) 114  Resp: (!) 31 17 (!) 23 16  Temp:  (!) 100.5 F (38.1 C)    TempSrc:      SpO2: (!) 86% 92% 93% 93%  Weight:      Height:        Constitutional: Middle-age male who appears to be in no acute distress at this time Eyes: PERRL, lids and conjunctivae normal ENMT: Mucous membranes are moist.    Neck: normal, supple,  Respiratory: Decreased overall aeration without significant wheezes appreciated and some intermittent crackles noted in the lower lung fields.  Currently on 2 L of nasal cannula oxygen. Cardiovascular: Regular rate and rhythm, no murmurs / rubs / gallops.  At least 2+ edema bilateral lower extremity edema. Abdomen: Ostomy present at the right lower quadrant of the abdomen. Midline Surgical wound present with some erythema around the edges.  Foley catheter in place draining orange discolored urine. Musculoskeletal:  Skin: no rashes, lesions, ulcers. No induration Neurologic: CN 2-12 grossly intact.  Strength 5/5 in all 4.  Psychiatric: Normal judgment and insight. Alert and oriented x 3. Normal mood.   Data Reviewed:   Reviewed labs, imaging and pertinent records  as noted above in HPI   Family Communication: Wife updated at bedside. Primary team communication:   Thank you very much for involving [place] in the care of your patient.  Author: Norval Morton, MD 02/15/2022 10:28 AM  For on call review [email or web address].

## 2022-02-15 NOTE — Progress Notes (Cosign Needed Addendum)
Patient ID: Patrick [name], male   DOB: [birth year], 52 y.o.   MRN: [id number] 5 Days Post-Op    Subjective: Passing gas in ostomy.  No nausea.  Tolerated some sips of clears from the floor yesterday  Doesn't feel well.  T max 101.3, 100.5 right now.  O2, 2L, 92%.     Objective: Vital signs in last 24 hours: Temp:  [100.1 F (37.8 C)-101.3 F (38.5 C)] 100.5 F (38.1 C) (01/29 0800) Pulse Rate:  [105-132] 118 (01/29 0800) Resp:  [11-31] 17 (01/29 0800) BP: (88-121)/(55-73) 109/61 (01/29 0800) SpO2:  [84 %-99 %] 92 % (01/29 0800) Weight:  [112.4 kg] 112.4 kg (01/29 0500) Last BM Date :  (pta)  Intake/Output from previous day: 01/28 0701 - 01/29 0700 In: 2988.4 [I.V.:2838.2; IV Piggyback:150.1] Out: 1140 [Urine:890; Drains:250] Intake/Output this shift: Total I/O In: 119.9 [I.V.:119.9] Out: 175 [Urine:175]  Gen: NAD, but appears tired Heart: regular, but tachy Lungs: CTAB, 2L [place], sats in low 90s. Abd: soft, appropriately tender, wound VAC removed.  Wound with some increasing yellow slough on edges and base.  No overt infection.  Some erythema around edges of wound c/w irritation from sponge from VAC.  JP drain with bright yellow serous fluid.    GU: foley in place with orange colored urine. Ext: some mild BLE edema Skin: some jaundice Psych: A&Ox3  Lab Results: CBC  Recent Labs    02/14/22 0445 02/15/22 0441  WBC 16.6* 16.1*  HGB 8.4* 7.8*  HCT 28.4* 25.2*  PLT 459* 378   BMET Recent Labs    02/14/22 0445 02/15/22 0441  NA 138 134*  K 3.8 3.8  CL 104 101  CO2 22 25  GLUCOSE 180* 210*  BUN 16 24*  CREATININE 1.18 1.51*  CALCIUM 7.3* 7.3*   PT/INR No results for input(s): "LABPROT", "INR" in the last 72 hours. ABG No results for input(s): "PHART", "HCO3" in the last 72 hours.  Invalid input(s): "PCO2", "PO2"  Studies/Results: DG Abd 1 View  Result Date: 02/14/2022 CLINICAL DATA:  51 year old male status post NG tube placement. Status post exploratory  laparotomy postoperative day 4, retroperitoneal mass with invasion of proximal small bowel, transverse colon, perforation and fecal contamination. EXAM: ABDOMEN - 1 VIEW COMPARISON:  CT Abdomen and Pelvis 02/02/2022. FINDINGS: Prior CABG. There is evidence of a linear enteric tube in the midline projecting over the thoracic spine to the T7-T8 level. Tip is just at the level of the diaphragm. Side hole is not included. Left upper quadrant surgical clips are new. Paucity of bowel gas in the upper abdomen. Left lung base opacity also appears increased. IMPRESSION: 1. Enteric tube projects over the midline thoracic spine, tip at the level of the diaphragm. This should probably be advanced about 12 cm to allow for side hole placement within the stomach. Recommend repeat portable KUB at that time. 2. Recent postoperative changes in the left upper quadrant. Increased left lung base opacity since 02/02/2022. Electronically Signed   By: Genevie Ann M.D.   On: 02/14/2022 05:32    Anti-infectives: Anti-infectives (From admission, onward)    Start     Dose/Rate Route Frequency Ordered Stop   02/15/22 1500  piperacillin-tazobactam (ZOSYN) IVPB 3.375 g        3.375 g 12.5 mL/hr over 240 Minutes Intravenous Every 8 hours 02/15/22 0836 02/17/22 0659   02/08/2022 1100  piperacillin-tazobactam (ZOSYN) IVPB 3.375 g        3.375 g 100 mL/hr over 30 Minutes  Intravenous  Once 01/24/2022 1056 02/05/2022 1230   01/23/2022 0200  piperacillin-tazobactam (ZOSYN) IVPB 3.375 g  Status:  Discontinued        3.375 g 12.5 mL/hr over 240 Minutes Intravenous Every 8 hours 01/18/2022 2322 02/15/22 0836   02/13/2022 1900  piperacillin-tazobactam (ZOSYN) IVPB 3.375 g        3.375 g 100 mL/hr over 30 Minutes Intravenous  Once 01/27/2022 1857 01/30/2022 2012       Assessment/Plan: 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, proximal small bowel with ischemic necrotic proximal jejunum with perforation fecal contamination with  abscess  POD 5, S/P Exploratory laparotomy with resection of transverse colon, drainage of mesenteric abscess, resection of proximal jejunum with small bowel anastomosis and 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  - AROBF, D/C NGT, clears  -febrile overnight night with T max 101.3.  may need CT Scan to eval for post op complication.  WBC up yesterday and today to 16K - TNA - JP SS - DC VAC and start BID dressing changes to midline wound to help debride some slough - WOC following for new ostomy - continue IV abx - surgical path is pending  -PT/OT evals  AKI - back up to 1.51 despite IVFs/TNA.  Making good UOP.  Not on any nephrotoxic meds.   Fluid overload/hypoxia - 12L + this admission.  Cont full rate TNA.  Stop LR at 20cc/hr.  Given '20mg'$  lasix BID but given 50g of albumin to help combat given increase in cr.  Albumin is poor at 1.6 this am as well.  Obtain echo.  H/o CABG.  Has had Echo in [place] system   at least 2 yrs ago but can't see this.  PCXR as well.  Abx x-ray yesterday showed possible infiltrate in LLL so eval this as well.  CAD, s/p CABG - no chest pain, echo as above DM - SSI Hyperbilirubinemia - unclear etiology.  Up to 6 today.  Fractionate out his TB.  Could be secondary to acute infection, cholestasis etc, but will need to monitor.  Other LFTs essentially normal. Chronic osteomyelitis of left foot - s/p amputation  FEN: CLD/TNA/lasix/albumin VTE: LMWH ID: Zosyn 1/23>> monitor WBC     LOS: 6 days   Henreitta Cea PA-C Use [email or web address] to contact on call provider  02/15/2022

## 2022-02-15 NOTE — Progress Notes (Addendum)
Physical Therapy Treatment Patient Details Name: Patrick [name] MRN: [id number] DOB: 24-Feb-1970 Today's Date: 02/15/2022   History of Present Illness Pt is a 52 y/o male admitted 1/24 with progressive anorexia, weight loss and abdominal mass.  1/24 pt s/p exp. Lap. With resection of transverse colon, drainage of mesenteric abscess, resection of proximal jejunum with small bowel anastomosis and diverting right colostomy.  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    PT Comments    Pt agree to participate with encouragement and prodding.  Emphasis on rolling, transition up via R elbow, sit to standing, transfers in between sitting rests to recover ( and to get Psyched up), with progression of gait with the RW into the halls for approx 60 feet.    Recommendations for follow up therapy are one component of a multi-disciplinary discharge planning process, led by the attending physician.  Recommendations may be updated based on patient status, additional functional criteria and insurance authorization.  Follow Up Recommendations  Home health PT     Assistance Recommended at Discharge Intermittent Supervision/Assistance  Patient can return home with the following A little help with walking and/or transfers;A little help with bathing/dressing/bathroom;Assistance with cooking/housework;Assist for transportation;Help with stairs or ramp for entrance   Equipment Recommendations  Other (comment) (tbd)    Recommendations for Other Services       Precautions / Restrictions Precautions Precautions: Fall Precaution Comments: NG tube, JP Drain, watch O2     Mobility  Bed Mobility Overal bed mobility: Needs Assistance Bed Mobility: Sidelying to Sit   Sidelying to sit: Mod assist       General bed mobility comments: Light mod A up only R elbow then light min up to sitting EOB    Transfers Overall transfer level: Needs  assistance Equipment used: 1 person hand held assist Transfers: Sit to/from Stand, Bed to chair/wheelchair/BSC Sit to Stand: Min assist   Step pivot transfers: Min assist            Ambulation/Gait Ambulation/Gait assistance: Herbalist (Feet): 60 Feet Assistive device: Rolling walker (2 wheels) Gait Pattern/deviations: Step-through pattern, Decreased step length - right, Decreased step length - left, Decreased stride length   Gait velocity interpretation: <1.31 ft/sec, indicative of household ambulator   General Gait Details: short slow steps with flexed posture. cues for proximity to the RW and posture   Stairs             Wheelchair Mobility    Modified Rankin (Stroke Patients Only)       Balance Overall balance assessment: Needs assistance Sitting-balance support: No upper extremity supported, Feet supported Sitting balance-Leahy Scale: Fair       Standing balance-Leahy Scale: Poor Standing balance comment: external support needed due to transmet amps and neuropatny                            Cognition Arousal/Alertness: Awake/alert Behavior During Therapy: Flat affect Overall Cognitive Status: Within Functional Limits for tasks assessed                                          Exercises      General Comments General comments (skin integrity, edema, etc.): vss, sats in low 90's on 4L [place], HR in the 100's.  Pertinent Vitals/Pain Pain Assessment Pain Assessment: Faces Faces Pain Scale: Hurts a little bit Pain Descriptors / Indicators: Tender, Sore Pain Intervention(s): Monitored during session    Home Living                          Prior Function            PT Goals (current goals can now be found in the care plan section) Acute Rehab PT Goals PT Goal Formulation: With patient Time For Goal Achievement: 02/25/22 Potential to Achieve Goals: Good Progress towards PT goals:  Progressing toward goals    Frequency    Min 3X/week      PT Plan Current plan remains appropriate    Co-evaluation              AM-PAC PT "6 Clicks" Mobility   Outcome Measure  Help needed turning from your back to your side while in a flat bed without using bedrails?: A Little Help needed moving from lying on your back to sitting on the side of a flat bed without using bedrails?: A Lot Help needed moving to and from a bed to a chair (including a wheelchair)?: A Lot Help needed standing up from a chair using your arms (e.g., wheelchair or bedside chair)?: A Little Help needed to walk in hospital room?: A Little Help needed climbing 3-5 steps with a railing? : A Lot 6 Click Score: 15    End of Session   Activity Tolerance: Patient tolerated treatment well;Patient limited by fatigue Patient left: in bed;with call bell/phone within reach;with nursing/sitter in room Nurse Communication: Mobility status PT Visit Diagnosis: Unsteadiness on feet (R26.81);Muscle weakness (generalized) (M62.81);Pain     Time: 1645-1710 PT Time Calculation (min) (ACUTE ONLY): 25 min  Charges:  $Gait Training: 8-22 mins $Therapeutic Activity: 8-22 mins                     02/15/2022  Patrick Carne., PT Acute Rehabilitation Services [phone]  (office)   Patrick [name] 02/15/2022, 6:33 PM

## 2022-02-16 ENCOUNTER — Inpatient Hospital Stay (HOSPITAL_COMMUNITY): Payer: BC Managed Care – PPO

## 2022-02-16 DIAGNOSIS — R17 Unspecified jaundice: Secondary | ICD-10-CM

## 2022-02-16 LAB — BASIC METABOLIC PANEL
Anion gap: 7 (ref 5–15)
BUN: 36 mg/dL — ABNORMAL HIGH (ref 6–20)
CO2: 26 mmol/L (ref 22–32)
Calcium: 7.3 mg/dL — ABNORMAL LOW (ref 8.9–10.3)
Chloride: 101 mmol/L (ref 98–111)
Creatinine, Ser: 2.02 mg/dL — ABNORMAL HIGH (ref 0.61–1.24)
GFR, Estimated: 39 mL/min — ABNORMAL LOW (ref >60)
Glucose, Bld: 209 mg/dL — ABNORMAL HIGH (ref 70–99)
Potassium: 4.2 mmol/L (ref 3.5–5.1)
Sodium: 134 mmol/L — ABNORMAL LOW (ref 135–145)

## 2022-02-16 LAB — HEPATIC FUNCTION PANEL
ALT: 20 U/L (ref 0–44)
AST: 50 U/L — ABNORMAL HIGH (ref 15–41)
Albumin: 1.5 g/dL — ABNORMAL LOW (ref 3.5–5.0)
Alkaline Phosphatase: 78 U/L (ref 38–126)
Bilirubin, Direct: 4.7 mg/dL — ABNORMAL HIGH (ref 0.0–0.2)
Indirect Bilirubin: 2.1 mg/dL — ABNORMAL HIGH (ref 0.3–0.9)
Total Bilirubin: 6.8 mg/dL — ABNORMAL HIGH (ref 0.3–1.2)
Total Protein: 4.6 g/dL — ABNORMAL LOW (ref 6.5–8.1)

## 2022-02-16 LAB — CBC
HCT: 24.8 % — ABNORMAL LOW (ref 39.0–52.0)
Hemoglobin: 7.4 g/dL — ABNORMAL LOW (ref 13.0–17.0)
MCH: 23.5 pg — ABNORMAL LOW (ref 26.0–34.0)
MCHC: 29.8 g/dL — ABNORMAL LOW (ref 30.0–36.0)
MCV: 78.7 fL — ABNORMAL LOW (ref 80.0–100.0)
Platelets: 389 10*3/uL (ref 150–400)
RBC: 3.15 MIL/uL — ABNORMAL LOW (ref 4.22–5.81)
RDW: 17.1 % — ABNORMAL HIGH (ref 11.5–15.5)
WBC: 16.3 10*3/uL — ABNORMAL HIGH (ref 4.0–10.5)
nRBC: 0.1 % (ref 0.0–0.2)

## 2022-02-16 LAB — GLUCOSE, CAPILLARY
Glucose-Capillary: 166 mg/dL — ABNORMAL HIGH (ref 70–99)
Glucose-Capillary: 168 mg/dL — ABNORMAL HIGH (ref 70–99)
Glucose-Capillary: 171 mg/dL — ABNORMAL HIGH (ref 70–99)
Glucose-Capillary: 188 mg/dL — ABNORMAL HIGH (ref 70–99)
Glucose-Capillary: 208 mg/dL — ABNORMAL HIGH (ref 70–99)
Glucose-Capillary: 216 mg/dL — ABNORMAL HIGH (ref 70–99)

## 2022-02-16 LAB — PROTIME-INR
INR: 1.4 — ABNORMAL HIGH (ref 0.8–1.2)
Prothrombin Time: 16.7 seconds — ABNORMAL HIGH (ref 11.4–15.2)

## 2022-02-16 MED ORDER — ALTEPLASE 2 MG IJ SOLR: 2.0000 mg | Freq: Once | INTRAMUSCULAR | Status: DC

## 2022-02-16 MED ORDER — MIDAZOLAM HCL 2 MG/2ML IJ SOLN
INTRAMUSCULAR | Status: AC | PRN
  Administered 2022-02-16: .5 mg via INTRAVENOUS

## 2022-02-16 MED ORDER — ALBUMIN HUMAN 25 % IV SOLN
25.0000 g | Freq: Four times a day (QID) | INTRAVENOUS | Status: DC
  Administered 2022-02-16: 25 g via INTRAVENOUS
  Administered 2022-02-16: 12.5 g via INTRAVENOUS
  Administered 2022-02-16 – 2022-02-17 (×4): 25 g via INTRAVENOUS
  Filled 2022-02-16 (×6): qty 100

## 2022-02-16 MED ORDER — METHOCARBAMOL 500 MG PO TABS
500.0000 mg | ORAL_TABLET | Freq: Four times a day (QID) | ORAL | Status: DC | PRN
  Administered 2022-02-19: 500 mg via ORAL
  Filled 2022-02-16: qty 1

## 2022-02-16 MED ORDER — LIDOCAINE HCL (PF) 1 % IJ SOLN
10.0000 mL | Freq: Once | INTRAMUSCULAR | Status: AC
  Administered 2022-02-16: 10 mL

## 2022-02-16 MED ORDER — ENOXAPARIN SODIUM 60 MG/0.6ML IJ SOSY
60.0000 mg | PREFILLED_SYRINGE | INTRAMUSCULAR | Status: DC
  Administered 2022-02-17: 60 mg via SUBCUTANEOUS
  Filled 2022-02-16: qty 0.6

## 2022-02-16 MED ORDER — SODIUM CHLORIDE 0.9% FLUSH
5.0000 mL | Freq: Three times a day (TID) | INTRAVENOUS | Status: DC
  Administered 2022-02-16 – 2022-02-22 (×14): 5 mL

## 2022-02-16 MED ORDER — ZINC CHLORIDE 1 MG/ML IV SOLN
INTRAVENOUS | Status: AC
  Filled 2022-02-16: qty 1320

## 2022-02-16 MED ORDER — MIDAZOLAM HCL 2 MG/2ML IJ SOLN
INTRAMUSCULAR | Status: AC
  Filled 2022-02-16: qty 2

## 2022-02-16 NOTE — Consult Note (Signed)
Chief Complaint: Patient was seen in consultation today for  Chief Complaint  Patient presents with   Abdominal Pain   at the request of General Surgery  Referring Physician(s): Reather Laurence, MD  Supervising Physician: Corrie Mckusick  Patient Status: [facility] - In-pt  History of Present Illness: Patrick [name] is a 52 y.o. male  with pmh of DM, osteomyelitis of left foot, now s/p amputation, and CABG, found to have RP mass with necrotic jejunum.  He is POD #6 ex lap with resection of transverse colon, mesenteric abscess drainage, resection of proximal jejunum and diverting R colostomy.  Repeat CT yesterday demonstrates    "Large ill-defined air fluid collection just distal to the surgical drain in the left upper quadrant adjacent to the transverse colonic suture line measuring up to 14.9 cm, suspicious for abscess. There are multiple additional smaller air-fluid collections in the abdomen as described."  And  "Subcapsular fluid collection about the spleen inferiorly containing air measuring 5.1 x 3.7 x 3.1 cm, suspicious for abscess."  IR consulted for possible drainage.  Past Medical History:  Diagnosis Date   Anxiety    Arthritis    CAD (coronary artery disease)    s/p quad CABG   Chronic osteomyelitis of left foot ([place]) 12/20/2018   Chronic osteomyelitis of right foot ([place]) 12/20/2018   Diabetes mellitus without complication ([place])    Diabetic foot infection ([place]) 12/20/2018   Diabetic neuropathy ([place]) 12/20/2018   History of kidney stones    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   Morbid obesity ([place]) 12/20/2018   MSSA (methicillin susceptible Staphylococcus aureus) infection 12/20/2018   PONV (postoperative nausea and vomiting)     Past Surgical History:  Procedure Laterality Date   AMPUTATION Right 05/10/2018   Procedure: PARTIAL 5TH RAY AMPUTATION RIGHT FOOT;  Surgeon: Caprice Beaver, DPM;  Location: [facility];  Service: Podiatry;   Laterality: Right;   AMPUTATION Left 10/26/2018   Procedure: PARTIAL 1ST RAY AMPUTATION FOOT;  Surgeon: Caprice Beaver, DPM;  Location: [facility];  Service: Podiatry;  Laterality: Left;   APPLICATION OF WOUND VAC Right 05/10/2018   Procedure: APPLICATION OF WOUND VAC RIGHT FOOT;  Surgeon: Caprice Beaver, DPM;  Location: [facility];  Service: Podiatry;  Laterality: Right;   APPLICATION OF WOUND VAC  01/30/2022   Procedure: APPLICATION OF WOUND VAC;  Surgeon: Erroll Luna, MD;  Location: [place];  Service: General;;   COLECTOMY WITH COLOSTOMY CREATION/HARTMANN PROCEDURE N/A 02/01/2022   ex-lap, resection of transverse colon with long Hartman's pouch, drainage of mesenteric abscess, resection of proximal jejunum with anastomosis - Dr. Brantley Stage   CORONARY ARTERY BYPASS GRAFT  04/01/2020   CABG x 5 (LIMA-LAD, RCA-PDA, VG-Diag, VG-OM, SVG-RPL) and LAA Clip at [facility] in [place] with Sande Brothers, MD   CORONARY ARTERY BYPASS GRAFT     x4   LAPAROTOMY N/A 01/26/2022   Procedure: EXPLORATORY LAPAROTOMY WITH BOWEL RESECTIOM;  Surgeon: Erroll Luna, MD;  Location: [place];  Service: General;  Laterality: N/A;   VASECTOMY     WOUND DEBRIDEMENT Right 05/10/2018   Procedure: DEBRIDEMENT PUNCTURE WOUND;  Surgeon: Caprice Beaver, DPM;  Location: [facility];  Service: Podiatry;  Laterality: Right;   WOUND DEBRIDEMENT Left 05/10/2018   Procedure: DEBRIDEMENT ULCERATION LEFT FOOT;  Surgeon: Caprice Beaver, DPM;  Location: [facility];  Service: Podiatry;  Laterality: Left;   WOUND DEBRIDEMENT Left 10/26/2018   Procedure: DEBRIDEMENT ULCER LEFT FOOT;  Surgeon: Caprice Beaver, DPM;  Location:  [facility];  Service: Podiatry;  Laterality: Left;    Allergies: Morphine  Medications: Prior to Admission medications   Medication Sig Start Date End Date Taking? Authorizing Provider  APPLE CIDER VINEGAR PO Take 450 mg by mouth daily.   Yes [provider]  aspirin EC (BAYER ASPIRIN EC LOW DOSE) 81 MG  tablet Take 81 mg by mouth daily.   Yes [provider]  atorvastatin (LIPITOR) 20 MG tablet Take 20 mg by mouth every evening.   Yes [provider]  escitalopram (LEXAPRO) 20 MG tablet Take 20 mg by mouth daily. 01/04/22  Yes [provider]  folic acid (FOLVITE) 1 MG tablet Take 1 tablet by mouth daily. 12/04/21  Yes [provider]  glipiZIDE (GLUCOTROL) 5 MG tablet Take 5 mg by mouth 2 (two) times daily before a meal. 05/22/20  Yes [provider]  omeprazole (PRILOSEC) 20 MG capsule Take 20 mg by mouth daily. 01/14/22  Yes [provider]     No family history on file.  Social History   Socioeconomic History   Marital status: Married    Spouse name: Not on file   Number of children: Not on file   Years of education: Not on file   Highest education level: Not on file  Occupational History   Not on file  Tobacco Use   Smoking status: Never   Smokeless tobacco: Former    Types: Chew    Quit date: 05/04/1989  Vaping Use   Vaping Use: Never used  Substance and Sexual Activity   Alcohol use: Not Currently   Drug use: Never   Sexual activity: Yes  Other Topics Concern   Not on file  Social History Narrative   Not on file   Social Determinants of Health   Financial Resource Strain: Not on file  Food Insecurity: Not on file  Transportation Needs: Not on file  Physical Activity: Not on file  Stress: Not on file  Social Connections: Not on file    Review of Systems: A 12 point ROS discussed and pertinent positives are indicated in the HPI above.  All other systems are negative.  Review of Systems  Constitutional:  Positive for activity change and appetite change.  HENT: Negative.    Eyes: Negative.   Respiratory: Negative.    Cardiovascular: Negative.   Gastrointestinal:  Positive for abdominal pain.  Endocrine: Negative.    Vital Signs: BP 111/63   Pulse (!) 114   Temp 100 F (37.8 C) (Oral)   Resp 16   Ht  6' (1.829 m)   Wt 247 lb 12.8 oz (112.4 kg)   SpO2 95%   BMI 33.61 kg/m   Physical Exam Vitals reviewed.  Constitutional:      General: He is not in acute distress.    Appearance: He is ill-appearing.     Interventions: Nasal cannula in place.     Comments: Generalized anasarca  HENT:     Head: Normocephalic and atraumatic.     Mouth/Throat:     Pharynx: Oropharynx is clear.  Eyes:     General: Scleral icterus present.  Cardiovascular:     Rate and Rhythm: Tachycardia present.  Pulmonary:     Effort: Pulmonary effort is normal. No respiratory distress.  Abdominal:     Tenderness: There is abdominal tenderness.     Comments: Open midline wound.  R-sided ostomy  Skin:    General: Skin is warm.     Coloration: Skin  is jaundiced.  Neurological:     General: No focal deficit present.     Mental Status: He is alert.     Imaging: CT ABDOMEN PELVIS WO CONTRAST  Result Date: 02/15/2022 CLINICAL DATA:  Abdominal pain, post-op s/p SBR and transverse colectomy for large retroperitoneal mass, fevers, leukocytosis. EXAM: CT ABDOMEN AND PELVIS WITHOUT CONTRAST TECHNIQUE: Multidetector CT imaging of the abdomen and pelvis was performed following the standard protocol without IV contrast. RADIATION DOSE REDUCTION: This exam was performed according to the departmental dose-optimization program which includes automated exposure control, adjustment of the mA and/or kV according to patient size and/or use of iterative reconstruction technique. COMPARISON:  CT 02/05/2022 FINDINGS: Lower chest: Bilateral lower lobe airspace disease, left greater than right. The left lower lobe pulmonary parenchyma is likely heterogeneous. Small left pleural effusion. Decreased density of the blood pool suggests anemia. Hepatobiliary: No evidence of focal hepatic abnormality on this unenhanced exam. Layering gallstones in the gallbladder which is mildly distended. No biliary dilatation. Pancreas: No ductal dilatation  or inflammation. Spleen: Subcapsular fluid collection, inferiorly containing air measures approximately 5.1 x 3.7 x 3.1 cm, series 3, image 35 and series 6, image 2018. The spleen is normal in size. Adrenals/Urinary Tract: Normal adrenal glands. No hydronephrosis. Mild bilateral perinephric edema. Minimally distended urinary bladder. Foley catheter is in place, balloon is likely in the bladder. Air in the bladder is consistent with Foley placement. Stomach/Bowel: Lack of enteric contrast limits detailed bowel assessment. Interval transverse colostomy. There is no small bowel obstruction or postoperative ileus. The appendix is tentatively visualized and normal. Surgical drain entering from the right paramidline courses into the left abdomen and terminates in the left upper quadrant anteriorly. Just distal to the drain is a large heterogeneous ill-defined air fluid collection. Exact size measurement is difficult due to the ill-defined nature, however representative measurements of 12.4 x 13.9 x 14.9 cm, series 3, image 35 and series 6, image 37. This is at the level of the transverse colonic staple line. This may be contiguous with a component extending into the left upper quadrant series 3, image 29. Ill-defined air fluid collection in the left pericolic gutter measures approximately 7.4 x 4.3 x 8.4 cm, series 3, image 49 and series 6, image 85. There are multiple scattered small fluid collections and ill-defined fluid as well as mottled air within the right upper quadrant abdominal fat. Vascular/Lymphatic: Normal caliber abdominal aorta with mild atherosclerosis. Large presumed lymph node adjacent to the celiac axis measures 3.7 cm series 3, image 23. There are multifocal prominent and mildly enlarged retroperitoneal nodes, including 13 mm aortocaval node series 3, image 39. Prominent and mildly enlarged mesenteric, periportal and upper abdominal nodes measuring up to 14 mm series 3, image 32 adjacent to the fluid  collection. Reproductive: No prostatic mass. Other: Recent postsurgical change with open midline abdominal wound. Scattered foci of free air deep to the abdominal wound as well as patchy air within the right upper abdominal fat. Abdominopelvic fluid collections as described above. Scattered areas of non organized free fluid in the upper abdomen and pelvis. Generalized body Kendle edema consistent with anasarca. Musculoskeletal: There are no acute or suspicious osseous abnormalities. IMPRESSION: 1. Interval transverse colostomy. Large ill-defined air fluid collection just distal to the surgical drain in the left upper quadrant adjacent to the transverse colonic suture line measuring up to 14.9 cm, suspicious for abscess. There are multiple additional smaller air-fluid collections in the abdomen as described. Areas of non organized free fluid  are also seen which are indeterminate. 2. Subcapsular fluid collection about the spleen inferiorly containing air measuring 5.1 x 3.7 x 3.1 cm, suspicious for abscess. 3. Bilateral lower lobe airspace disease, left greater than right, greater than typically seen with atelectasis and suspicious for pneumonia. Small left pleural effusion. 4. Recent postsurgical change with open midline abdominal wound. Scattered foci of free air deep to the abdominal wound as well as patchy air within the right upper quadrant abdominal fat, likely postsurgical. 5. Cholelithiasis without CT findings of acute cholecystitis. 6. Abdominal adenopathy with a dominant lymph node adjacent to the celiac axis. Prominent and mildly enlarged retroperitoneal, mesenteric, periportal, and upper abdominal nodes, nonspecific. 7. Generalized body Verry edema consistent with anasarca. Aortic Atherosclerosis (ICD10-I70.0). These results will be called to the ordering clinician or representative by the Radiologist Assistant, and communication documented in the PACS or [employer]. Electronically Signed   By: Keith Rake M.D.   On: 02/15/2022 18:16   ECHOCARDIOGRAM COMPLETE  Result Date: 02/15/2022    ECHOCARDIOGRAM REPORT   Patient Name:   Patrick [name] [place] Date of Exam: 02/15/2022 Medical Rec #:  [id number]    Height:       72.0 in Accession #:    [id number]   Weight:       247.8 lb Date of Birth:  February 28, 1970   BSA:          2.334 m Patient Age:    32 years     BP:           106/65 mmHg Patient Gender: M            HR:           104 bpm. Exam Location:  Inpatient Procedure: 2D Echo, Color Doppler and Cardiac Doppler Indications:    Fluid overload [[id number]]  History:        Patient has no prior history of Echocardiogram examinations.                 CAD, Prior CABG; Risk Factors:Diabetes.  Sonographer:    Greer Pickerel Referring Phys: [id number] Saverio Danker  Sonographer Comments: Patient is obese. Image acquisition challenging due to respiratory motion. IMPRESSIONS  1. Left ventricular ejection fraction, by estimation, is 50%. The left ventricle has mildly decreased function. The left ventricle demonstrates global hypokinesis with septal bounce suggesting prior cardiac surgery. Left ventricular diastolic parameters  are consistent with Grade II diastolic dysfunction (pseudonormalization).  2. Peak RV-RA gradient 14 mmHg. Right ventricular systolic function is normal. The right ventricular size is normal.  3. Left atrial size was moderately dilated.  4. Right atrial size was mildly dilated.  5. The mitral valve is normal in structure. No evidence of mitral valve regurgitation. No evidence of mitral stenosis.  6. The aortic valve is tricuspid. Aortic valve regurgitation is not visualized. No aortic stenosis is present.  7. Aortic dilatation noted. There is mild dilatation of the ascending aorta, measuring 40 mm.  8. Technically difficult study with poor acoustic windows. FINDINGS  Left Ventricle: Left ventricular ejection fraction, by estimation, is 50%. The left ventricle has mildly decreased function. The left ventricle  demonstrates global hypokinesis. The left ventricular internal cavity size was normal in size. There is no left ventricular hypertrophy. Left ventricular diastolic parameters are consistent with Grade II diastolic dysfunction (pseudonormalization). Right Ventricle: Peak RV-RA gradient 14 mmHg. The right ventricular size is normal. No increase in right ventricular Berryman thickness. Right ventricular systolic function is normal.  Left Atrium: Left atrial size was moderately dilated. Right Atrium: Right atrial size was mildly dilated. Pericardium: There is no evidence of pericardial effusion. Mitral Valve: The mitral valve is normal in structure. No evidence of mitral valve regurgitation. No evidence of mitral valve stenosis. Tricuspid Valve: The tricuspid valve is normal in structure. Tricuspid valve regurgitation is trivial. Aortic Valve: The aortic valve is tricuspid. Aortic valve regurgitation is not visualized. No aortic stenosis is present. Pulmonic Valve: The pulmonic valve was normal in structure. Pulmonic valve regurgitation is not visualized. Aorta: The aortic root is normal in size and structure and aortic dilatation noted. There is mild dilatation of the ascending aorta, measuring 40 mm. Venous: The inferior vena cava was not well visualized. IAS/Shunts: No atrial level shunt detected by color flow Doppler.  LEFT VENTRICLE PLAX 2D LVIDd:         5.30 cm   Diastology LVIDs:         3.70 cm   LV e' medial:    10.90 cm/s LV PW:         1.10 cm   LV E/e' medial:  11.3 LV IVS:        0.80 cm   LV e' lateral:   16.60 cm/s LVOT diam:     2.40 cm   LV E/e' lateral: 7.4 LV SV:         86 LV SV Index:   37 LVOT Area:     4.52 cm  RIGHT VENTRICLE RV S prime:     12.30 cm/s TAPSE (M-mode): 2.2 cm LEFT ATRIUM            Index        RIGHT ATRIUM           Index LA diam:      4.90 cm  2.10 cm/m   RA Area:     19.40 cm LA Vol (A2C): 47.4 ml  20.31 ml/m  RA Volume:   48.70 ml  20.87 ml/m LA Vol (A4C): 100.0 ml 42.85  ml/m  AORTIC VALVE             PULMONIC VALVE LVOT Vmax:   120.00 cm/s PR End Diast Vel: 1.83 msec LVOT Vmean:  80.400 cm/s LVOT VTI:    0.191 m  AORTA Ao Root diam: 3.80 cm Ao Asc diam:  4.00 cm MITRAL VALVE                TRICUSPID VALVE MV Area (PHT): 2.33 cm     TR Peak grad:   14.6 mmHg MV Decel Time: 325 msec     TR Vmax:        191.00 cm/s MV E velocity: 123.00 cm/s MV A velocity: 43.50 cm/s   SHUNTS MV E/A ratio:  2.83         Systemic VTI:  0.19 m                             Systemic Diam: 2.40 cm Dalton McleanMD Electronically signed by Franki Monte Signature Date/Time: 02/15/2022/5:47:20 PM    Final    DG CHEST PORT 1 VIEW  Result Date: 02/15/2022 CLINICAL DATA:  Chest pain and hypoxia. Status post colectomy and colostomy creation. EXAM: PORTABLE CHEST 1 VIEW COMPARISON:  CT abdomen 01/24/2022 and abdominal radiograph 02/06/2022 FINDINGS: Low lung volumes with some elevation of left hemidiaphragm. Patchy densities at the right lung base most likely represent atelectasis. Persistent  densities at the left lung base. There is a right arm PICC line with the tip near the superior cavoatrial junction. Heart size appears to be upper limits of normal with post CABG changes. IMPRESSION: Low lung volumes with bibasilar lung densities. Findings could represent a combination of atelectasis and consolidation. Cannot exclude pleural effusions. Electronically Signed   By: Markus Daft M.D.   On: 02/15/2022 11:15   DG Abd 1 View  Result Date: 02/14/2022 CLINICAL DATA:  52 year old male status post NG tube placement. Status post exploratory laparotomy postoperative day 4, retroperitoneal mass with invasion of proximal small bowel, transverse colon, perforation and fecal contamination. EXAM: ABDOMEN - 1 VIEW COMPARISON:  CT Abdomen and Pelvis 01/18/2022. FINDINGS: Prior CABG. There is evidence of a linear enteric tube in the midline projecting over the thoracic spine to the T7-T8 level. Tip is just at the level  of the diaphragm. Side hole is not included. Left upper quadrant surgical clips are new. Paucity of bowel gas in the upper abdomen. Left lung base opacity also appears increased. IMPRESSION: 1. Enteric tube projects over the midline thoracic spine, tip at the level of the diaphragm. This should probably be advanced about 12 cm to allow for side hole placement within the stomach. Recommend repeat portable KUB at that time. 2. Recent postoperative changes in the left upper quadrant. Increased left lung base opacity since 01/21/2022. Electronically Signed   By: Genevie Ann M.D.   On: 02/14/2022 05:32   [place] EKG SITE RITE  Result Date: 02/12/2022 If Site Rite image not attached, placement could not be confirmed due to current cardiac rhythm.  CT ABDOMEN PELVIS WO CONTRAST  Result Date: 02/01/2022 CLINICAL DATA:  Patient sent over from primary care physician for ischemic bowel and subsequent abdominal pain. EXAM: CT ABDOMEN AND PELVIS WITHOUT CONTRAST TECHNIQUE: Multidetector CT imaging of the abdomen and pelvis was performed following the standard protocol without IV contrast. RADIATION DOSE REDUCTION: This exam was performed according to the departmental dose-optimization program which includes automated exposure control, adjustment of the mA and/or kV according to patient size and/or use of iterative reconstruction technique. COMPARISON:  None Available. FINDINGS: Lower chest: Multiple sternal wires are present. Mild to moderate severity lingular and left lower lobe linear atelectasis is seen. Mild atelectatic changes are also noted within the right lower lobe. Hepatobiliary: No focal liver abnormality is seen. A layer of tiny gallstones is seen within the dependent portion of the gallbladder. There is no evidence of gallbladder Marlatt thickening or biliary dilatation. Pancreas: Atrophic changes are seen involving the body and head of the pancreas. The pancreatic tail is not clearly identified (see below). There is no  evidence of pancreatic ductal dilatation. Spleen: Normal in size without focal abnormality. Adrenals/Urinary Tract: Adrenal glands are unremarkable. Kidneys are normal in size with heterogeneous enhancement of the renal parenchyma noted. A 16 mm diameter simple cyst is seen within the posterolateral aspect of the mid left kidney. No obstructing renal calculi or hydronephrosis is seen. Contrast is seen throughout the lumen of a mildly distended urinary bladder. Stomach/Bowel: A 12.5 cm x 15.3 cm x 17.8 cm lobulated, ill-defined, area containing fluid, extraluminal air and heterogeneous material is seen within the mid and upper left abdomen. This originates from the distal transverse colon which is markedly thickened and inflamed (best seen on coronal reformatted images 25 through 35, CT series 6). There is subsequently limited evaluation of the tail of the pancreas, left upper quadrant small bowel loops and a  portion of the body of the stomach. Marked severity surrounding mesenteric inflammatory fat stranding and numerous enlarged, adjacent mesenteric lymph nodes are seen (the largest measures approximately 2.4 cm). Dilated, markedly inflamed small bowel is seen extending into this left upper quadrant inflammatory process (best seen on coronal reformatted images 19 through 40, CT series 6). A gradual transition zone is seen as the affected small bowel loops exit the left upper quadrant. Vascular/Lymphatic: Aortic atherosclerosis. No enlarged abdominal or pelvic lymph nodes. Reproductive: The prostate gland is mildly enlarged and heterogeneous in appearance. Dense, curvilinear parenchymal calcifications are seen. Other: A 1.9 cm x 2.0 cm x 2.8 cm fat containing umbilical hernia is noted. A mild-to-moderate amount of pelvic free fluid is seen. Musculoskeletal: Multilevel degenerative changes are seen throughout the lumbar spine. IMPRESSION: 1. Marked severity colitis involving the distal transverse colon with an  adjacent 12.5 cm x 15.3 cm x 17.8 cm abscess within the mid and upper left abdomen, as described above. The presence of an underlying neoplastic process cannot be excluded. 2. Markedly inflamed, likely ischemic, partially obstructed proximal small bowel loops within the left upper quadrant. 3. Cholelithiasis. 4. Heterogeneous enhancement of the renal parenchyma which may represent sequelae associated with acute pyelonephritis. Correlation with urinalysis is recommended. 5. Small fat-containing umbilical hernia. 6. Evidence of prior median sternotomy with lingular and left lower lobe linear atelectasis. 7. Mild to moderate amount of pelvic free fluid. 8. Mild prostatomegaly.  Correlation with PSA values is recommended. 9. Aortic atherosclerosis. Aortic Atherosclerosis (ICD10-I70.0). Electronically Signed   By: Virgina Norfolk M.D.   On: 01/26/2022 23:20    Labs:  CBC: Recent Labs    02/13/22 0500 02/14/22 0445 02/15/22 0441 02/16/22 0520  WBC 11.7* 16.6* 16.1* 16.3*  HGB 7.3* 8.4* 7.8* 7.4*  HCT 23.4* 28.4* 25.2* 24.8*  PLT 424* 459* 378 389    COAGS: Recent Labs    02/16/22 0631  INR 1.4*    BMP: Recent Labs    02/13/22 0856 02/14/22 0445 02/15/22 0441 02/16/22 0520  NA 134* 138 134* 134*  K 3.7 3.8 3.8 4.2  CL 102 104 101 101  CO2 '23 22 25 26  '$ GLUCOSE 150* 180* 210* 209*  BUN 15 16 24* 36*  CALCIUM 7.3* 7.3* 7.3* 7.3*  CREATININE 1.05 1.18 1.51* 2.02*  GFRNONAA >60 >60 56* 39*    LIVER FUNCTION TESTS: Recent Labs    01/18/2022 1710 02/13/22 0500 02/15/22 0441 02/16/22 0631  BILITOT 1.5* 3.4* 5.9*  6.2* 6.8*  AST 24 20 34  33 50*  ALT '17 11 18  16 20  '$ ALKPHOS 93 49 75  75 78  PROT 7.0 4.0* 4.4*  4.5* 4.6*  ALBUMIN 2.6* 1.7* 1.6*  1.6* 1.5*    Assessment and Plan:  Post-operative abdominal abscesses --pt is tachycardic, has elevated WBC, and remains febrile --CT demonstrates multiple collections --Imaging reviewed by Dr. Earleen Newport who approves CT-guided  drainage, anticipate 1-2 drains needed. --pt agreeable to procedure.  He is NPO on TPN.    Risks and benefits discussed with the patient including bleeding, infection, damage to adjacent structures, bowel perforation/fistula connection, and sepsis.  All of the patient's questions were answered, patient is agreeable to proceed. Consent signed and in chart.   Thank you for this interesting consult.  I greatly enjoyed meeting Leelan Rajewski [place] and look forward to participating in their care.  A copy of this report was sent to the requesting provider on this date.  Electronically Signed: Pasty Spillers,  PA 02/16/2022, 9:24 AM   I spent a total of  30 minutes  in face to face in clinical consultation, greater than 50% of which was counseling/coordinating care for abdominal abscesses

## 2022-02-16 NOTE — Progress Notes (Signed)
PT Cancellation Note  Patient Details Name: RIVERS HAMRICK MRN: [id number] DOB: 01-Nov-1970   Cancelled Treatment:    Reason Eval/Treat Not Completed: Pain limiting ability to participate. Pt declines PT intervention again, reporting soreness at drain site. Pt reports "we will see about tomorrow". PT will attempt to follow up tomorrow to mobilize.   Zenaida Niece 02/16/2022, 4:01 PM

## 2022-02-16 NOTE — Progress Notes (Signed)
OT Cancellation Note  Patient Details Name: Patrick [name] MRN: [id number] DOB: 1971/01/04   Cancelled Treatment:    Reason Eval/Treat Not Completed: Patient at procedure or test/ unavailable (Pt currently off unit receiving CT guided visceral fluid drain and unable to participate in OT treatment session.)  Ailene Ravel, OTR/L,CBIS  Supplemental OT - [facility] Secure Chat Preferred   02/16/2022, 12:15 PM

## 2022-02-16 NOTE — Progress Notes (Signed)
PT Cancellation Note  Patient Details Name: Patrick [name] MRN: [id number] DOB: 1971/01/09   Cancelled Treatment:    Reason Eval/Treat Not Completed: Patient declined, no reason specified. Pt reports he is not doing anything at this time. PT provides encouragement however pt continues to decline. PT will attempt to follow up as time allows.   Zenaida Niece 02/16/2022, 10:52 AM

## 2022-02-16 NOTE — Progress Notes (Signed)
PHARMACY - TOTAL PARENTERAL NUTRITION CONSULT NOTE  Indication: Prolonged ileus  Patient Measurements: Height: 6' (182.9 cm) Weight: 112.4 kg (247 lb 12.8 oz) IBW/kg (Calculated) : 77.6 TPN AdjBW (KG): 86.6 Body mass index is 33.61 kg/m.  Assessment:  55 YOM presented with abdominal pain for 4 weeks, found to have retroperitoneal mass with necrotic jejunum, perforated fecal contamination and abscess. 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 Patient has been NPO for 4 days since admission. Patient/wife report he wasn't eating well for 4 weeks PTA and basically on a liquid diet. He endorses a ~10 lb weight loss during this time. Previously, he typically ate 2 meals a day (chicken, vegetables, beans and fruits) and 1 snack. Pharmacy consulted to dose TPN.  Glucose / Insulin: hx DM (A1C 6.6) on glipizide PTA. CBGs 186-216, received 28 units mSSI/24 hr  Electrolytes: Na 134 stable, K 3.8>4.2 (s/p KCl 44mq IV total yesterday); others WNL Renal: AKI - SCr up to 2.02 (BL 1), BUN up to 36 S/p Lasix '20mg'$  IV daily 1/29-1/30 Hepatic: AST up to 50 / ALT WNL, Tbili up to 6.8, TG 174 stable, albumin 1.5 S/p thiamine x 3 days (1/26>1/28) Intake / Output; MIVF: UOP 0.6 ml/kg/hr, NG output not recorded, drain 1469m colostomy 70m62mnet +13.8L this admit (increased) GI Imaging: 1/23 CT abd pelvis - Marked severity colitis with adjacent abscess  1/29 CT abd pelvis - suspicious for abscesses and PNA, small pleural effusion, anasarca GI Surgeries / Procedures: 1/24 s/p ex lap with resection of transverse colon, drainage of mesenteric abscess, resection of proximal jejunum with SB anastomosis and diverting R colostomy and long Hartman's pouch   Central access: PICC 02/12/22 TPN start date: 02/12/22  Nutritional Goals per RD assessment: Estimated Needs Total  Energy Estimated Needs: 2300-2500 Total Protein Estimated Needs: 120-135 grams Total Fluid Estimated Needs: >2 L/day   Goal TPN rate is 100 ml/hr, which will provide 132g protein, 324g dextrose, 2350 kcal in a 24-hr period.  Current Nutrition:  TPN CLD 1/29 (tolerated) >> back to NPO 1/30 for IR procedure  Plan:  Continue TPN at goal rate 100 ml/hr at 1800  Electrolytes in TPN: Na 100 mEq/L, K 45 mEq/L, Ca 3 mEq/L, Mg 4 mEq/L, Phos 15 mmol/L; Cl:Ac 1:2 Add standard MVI to TPN Remove standard trace elements with Tbili >6. Add back zinc '5mg'$  to TPN bag. Remove selenium/chromium for now with worsening AKI. Continue moderate SSI Q4H and adjust as needed  Increase regular insulin to 45 units added to TPN bag Monitor TPN labs daily until stable, then standard on Mon/Thurs - watch SCr trend closely and adjust electrolytes as needed   HalArturo MortonharmD, BCPS Please check AMION for all [facility] numbers Clinical Pharmacist 02/16/2022 7:32 AM

## 2022-02-16 NOTE — Progress Notes (Signed)
PROGRESS NOTE    Patrick [name]  BTD:[id number] DOB: 12-May-1970 DOA: 02/02/2022 PCP: Caryl Bis, MD    Brief Narrative:  52 year old male with history of abdominal pain after being initially found to have concern for pneumatosis and possible pneumoperitoneum on outpatient CT. Taken for exploratory laparotomy on 1/24   [facility] consulted for medical management.  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 -Per surgery   SIRS Patient has continued to have fevers up to 101 F with tachycardia.  Question possibility of infection postoperatively such as recurrent abscess.  Previous blood cultures from 1/23 have been negative and MRSA screen negative from 1/25.  The possibility of pneumonia is also on the differential.   - Procalcitonin elevated -continue looking for source-- drain being placed in IR 1/30   Acute hypoxic respiratory failure Patient noted to be hypoxic down to 84% on room air requiring 2 L nasal cannula oxygen maintain O2 saturations.  Chest x-ray had noted low lung volumes with bibasilar lung densities concerning for atelectasis -Encourage use of incentive spirometry and OOB   Acute kidney injury -Symptoms previously had been thought to be prerenal in nature and improved with IV fluids.    -Check urine sodium and urine creatinine(FeNa 0.1% suggesting prerenal, but currently think symptoms may be secondary to heart failure.) -avoid hypotension -will use albumin today -hold on lasix for now   Elevated BNP -appears to have LE edema-- probably 3rd spacing -hold on lasix for now    Hyperbilirubinemia Acute.Total bilirubin trending up to 6.2 today with direct bilirubin elevated 4.1., but had been 3.4 on 1/27.  Patient's other liver enzymes were within normal limits -normal bilirubin in 2020-- may need GI consult if continues to climb-- unclear reason- does not appear to be on any offending meds, ? Autoimmune-- r/o viral first   Coronary artery disease Patient does not report any complaints of chest pain.  Prior history of CABG back in 2022   Severe protein calorie malnutrition Patient currently on TPN.   DVT prophylaxis: SCDs Start: 02/07/2022 2308    Code Status: Full Code   Disposition Plan:  Level of care: ICU Status is: Inpatient     Subjective: +cough  Objective: Vitals:   02/16/22 1150 02/16/22 1155 02/16/22 1200 02/16/22 1204  BP: (!) 97/56 107/63 (!) 99/56 106/64  Pulse: (!) 112 (!) 111 (!) 111 (!) 113  Resp: 15 16 (!) 24 15  Temp:      TempSrc:      SpO2: 94% 94% 94% 94%  Weight:      Height:        Intake/Output Summary (Last 24 hours) at 02/16/2022 1215 Last data filed at 02/16/2022 0800 Gross per 24 hour  Intake 2349.82 ml  Output 1440 ml  Net 909.82 ml   Filed Weights   02/13/22 0500 02/14/22 0451 02/15/22 0500  Weight: 112.8 kg 112.9 kg 112.4 kg    Examination:   General: Appearance:    Obese male in no acute distress- jaundiced     Lungs:     respirations unlabored, diminished, on [place]  Heart:    Tachycardic.        Neurologic:   Awake, alert  Data Reviewed: I have personally reviewed following labs and imaging studies  CBC: Recent Labs  Lab 02/02/2022 1710 02/17/2022 0002 02/12/22 0240 02/13/22 0500 02/14/22 0445 02/15/22 0441 02/16/22 0520  WBC 19.7*   < > 10.6* 11.7* 16.6* 16.1* 16.3*  NEUTROABS 16.6*  --   --   --   --   --   --   HGB 11.3*   < > 8.0* 7.3* 8.4* 7.8* 7.4*  HCT 38.4*   < > 25.5* 23.4* 28.4* 25.2* 24.8*  MCV 77.9*   < > 75.7* 80.7 81.1 79.7* 78.7*  PLT 606*   < > 493* 424* 459* 378  389   < > = values in this interval not displayed.   Basic Metabolic Panel: Recent Labs  Lab 02/12/22 0240 02/13/22 0500 02/13/22 0856 02/14/22 0445 02/15/22 0441 02/16/22 0520  NA 139 133* 134* 138 134* 134*  K 3.7 5.5* 3.7 3.8 3.8 4.2  CL 105 102 102 104 101 101  CO2 23 21* '23 22 25 26  '$ GLUCOSE 107* 435* 150* 180* 210* 209*  BUN '20 14 15 16 '$ 24* 36*  CREATININE 1.06 0.92 1.05 1.18 1.51* 2.02*  CALCIUM 7.5* 7.4* 7.3* 7.3* 7.3* 7.3*  MG 1.6* 2.1 2.2 2.1 2.1  --   PHOS 2.9 4.0 2.5 3.0 2.9  --    GFR: Estimated Creatinine Clearance: 56.6 mL/min (A) (by C-G formula based on SCr of 2.02 mg/dL (H)). Liver Function Tests: Recent Labs  Lab 01/21/2022 1710 02/13/22 0500 02/15/22 0441 02/16/22 0631  AST 24 20 34  33 50*  ALT '17 11 18  16 20  '$ ALKPHOS 93 49 75  75 78  BILITOT 1.5* 3.4* 5.9*  6.2* 6.8*  PROT 7.0 4.0* 4.4*  4.5* 4.6*  ALBUMIN 2.6* 1.7* 1.6*  1.6* 1.5*   Recent Labs  Lab 02/13/2022 1710  LIPASE 26   No results for input(s): "AMMONIA" in the last 168 hours. Coagulation Profile: Recent Labs  Lab 02/16/22 0631  INR 1.4*   Cardiac Enzymes: No results for input(s): "CKTOTAL", "CKMB", "CKMBINDEX", "TROPONINI" in the last 168 hours. BNP (last 3 results) No results for input(s): "PROBNP" in the last 8760 hours. HbA1C: No results for input(s): "HGBA1C" in the last 72 hours. CBG: Recent Labs  Lab 02/15/22 2007 02/15/22 2356 02/16/22 0357 02/16/22 0812 02/16/22 1053  GLUCAP 186* 205* 216* 208* 188*   Lipid Profile: Recent Labs    02/15/22 0441  TRIG 174*   Thyroid Function Tests: No results for input(s): "TSH", "T4TOTAL", "FREET4", "T3FREE", "THYROIDAB" in the last 72 hours. Anemia Panel: No results for input(s): "VITAMINB12", "FOLATE", "FERRITIN", "TIBC", "IRON", "RETICCTPCT" in the last 72 hours. Sepsis Labs: Recent Labs  Lab 01/18/2022 1710 01/18/2022 1914 02/15/22 0441  PROCALCITON  --   --  2.93  LATICACIDVEN 1.7 1.5  --     Recent  Results (from the past 240 hour(s))  Culture, blood (routine x 2)     Status: None   Collection Time: 02/12/2022  5:10 PM   Specimen: BLOOD  Result Value Ref Range Status   Specimen Description BLOOD LEFT ANTECUBITAL  Final   Special Requests   Final    BOTTLES DRAWN AEROBIC AND ANAEROBIC Blood Culture adequate volume   Culture   Final    NO GROWTH 5 DAYS Performed at [facility], [address], [place], [place] [address]    Report Status 02/14/2022 FINAL  Final  Culture, blood (routine x 2)  Status: None   Collection Time: 01/28/2022  7:14 PM   Specimen: BLOOD  Result Value Ref Range Status   Specimen Description BLOOD SITE NOT SPECIFIED  Final   Special Requests   Final    BOTTLES DRAWN AEROBIC AND ANAEROBIC Blood Culture adequate volume   Culture   Final    NO GROWTH 5 DAYS Performed at [facility], [name] [place], [place] [address]    Report Status 02/14/2022 FINAL  Final  MRSA Next Gen by PCR, Nasal     Status: None   Collection Time: 02/11/22  7:58 AM   Specimen: Nasal Mucosa; Nasal Swab  Result Value Ref Range Status   MRSA by PCR Next Gen NOT DETECTED NOT DETECTED Final    Comment: (NOTE) The GeneXpert MRSA Assay (FDA approved for NASAL specimens only), is one component of a comprehensive MRSA colonization surveillance program. It is not intended to diagnose MRSA infection nor to guide or monitor treatment for MRSA infections. Test performance is not FDA approved in patients less than 41 years old. Performed at [facility], [place] [address], [place], [place] [address]   Culture, blood (Routine X 2) w Reflex to ID Panel     Status: None (Preliminary result)   Collection Time: 02/15/22  2:00 PM   Specimen: BLOOD  Result Value Ref Range Status   Specimen Description BLOOD SITE NOT SPECIFIED  Final   Special Requests   Final    BOTTLES DRAWN AEROBIC AND ANAEROBIC Blood Culture adequate volume   Culture   Final    NO GROWTH < 24  HOURS Performed at [facility], [place] [address] [place], [place] [address]    Report Status PENDING  Incomplete  Culture, blood (Routine X 2) w Reflex to ID Panel     Status: None (Preliminary result)   Collection Time: 02/15/22  2:05 PM   Specimen: BLOOD  Result Value Ref Range Status   Specimen Description BLOOD SITE NOT SPECIFIED  Final   Special Requests   Final    BOTTLES DRAWN AEROBIC AND ANAEROBIC Blood Culture adequate volume   Culture   Final    NO GROWTH < 24 HOURS Performed at [facility], [place] [address] [place], [place] [address]    Report Status PENDING  Incomplete         Radiology Studies: CT ABDOMEN PELVIS WO CONTRAST  Result Date: 02/15/2022 CLINICAL DATA:  Abdominal pain, post-op s/p SBR and transverse colectomy for large retroperitoneal mass, fevers, leukocytosis. EXAM: CT ABDOMEN AND PELVIS WITHOUT CONTRAST TECHNIQUE: Multidetector CT imaging of the abdomen and pelvis was performed following the standard protocol without IV contrast. RADIATION DOSE REDUCTION: This exam was performed according to the departmental dose-optimization program which includes automated exposure control, adjustment of the mA and/or kV according to patient size and/or use of iterative reconstruction technique. COMPARISON:  CT 02/04/2022 FINDINGS: Lower chest: Bilateral lower lobe airspace disease, left greater than right. The left lower lobe pulmonary parenchyma is likely heterogeneous. Small left pleural effusion. Decreased density of the blood pool suggests anemia. Hepatobiliary: No evidence of focal hepatic abnormality on this unenhanced exam. Layering gallstones in the gallbladder which is mildly distended. No biliary dilatation. Pancreas: No ductal dilatation or inflammation. Spleen: Subcapsular fluid collection, inferiorly containing air measures approximately 5.1 x 3.7 x 3.1 cm, series 3, image 35 and series 6, image 2018. The spleen is normal in size. Adrenals/Urinary Tract:  Normal adrenal glands. No  hydronephrosis. Mild bilateral perinephric edema. Minimally distended urinary bladder. Foley catheter is in place, balloon is likely in the bladder. Air in the bladder is consistent with Foley placement. Stomach/Bowel: Lack of enteric contrast limits detailed bowel assessment. Interval transverse colostomy. There is no small bowel obstruction or postoperative ileus. The appendix is tentatively visualized and normal. Surgical drain entering from the right paramidline courses into the left abdomen and terminates in the left upper quadrant anteriorly. Just distal to the drain is a large heterogeneous ill-defined air fluid collection. Exact size measurement is difficult due to the ill-defined nature, however representative measurements of 12.4 x 13.9 x 14.9 cm, series 3, image 35 and series 6, image 37. This is at the level of the transverse colonic staple line. This may be contiguous with a component extending into the left upper quadrant series 3, image 29. Ill-defined air fluid collection in the left pericolic gutter measures approximately 7.4 x 4.3 x 8.4 cm, series 3, image 49 and series 6, image 85. There are multiple scattered small fluid collections and ill-defined fluid as well as mottled air within the right upper quadrant abdominal fat. Vascular/Lymphatic: Normal caliber abdominal aorta with mild atherosclerosis. Large presumed lymph node adjacent to the celiac axis measures 3.7 cm series 3, image 23. There are multifocal prominent and mildly enlarged retroperitoneal nodes, including 13 mm aortocaval node series 3, image 39. Prominent and mildly enlarged mesenteric, periportal and upper abdominal nodes measuring up to 14 mm series 3, image 32 adjacent to the fluid collection. Reproductive: No prostatic mass. Other: Recent postsurgical change with open midline abdominal wound. Scattered foci of free air deep to the abdominal wound as well as patchy air within the right upper abdominal  fat. Abdominopelvic fluid collections as described above. Scattered areas of non organized free fluid in the upper abdomen and pelvis. Generalized body [place] edema consistent with anasarca. Musculoskeletal: There are no acute or suspicious osseous abnormalities. IMPRESSION: 1. Interval transverse colostomy. Large ill-defined air fluid collection just distal to the surgical drain in the left upper quadrant adjacent to the transverse colonic suture line measuring up to 14.9 cm, suspicious for abscess. There are multiple additional smaller air-fluid collections in the abdomen as described. Areas of non organized free fluid are also seen which are indeterminate. 2. Subcapsular fluid collection about the spleen inferiorly containing air measuring 5.1 x 3.7 x 3.1 cm, suspicious for abscess. 3. Bilateral lower lobe airspace disease, left greater than right, greater than typically seen with atelectasis and suspicious for pneumonia. Small left pleural effusion. 4. Recent postsurgical change with open midline abdominal wound. Scattered foci of free air deep to the abdominal wound as well as patchy air within the right upper quadrant abdominal fat, likely postsurgical. 5. Cholelithiasis without CT findings of acute cholecystitis. 6. Abdominal adenopathy with a dominant lymph node adjacent to the celiac axis. Prominent and mildly enlarged retroperitoneal, mesenteric, periportal, and upper abdominal nodes, nonspecific. 7. Generalized body [place] edema consistent with anasarca. Aortic Atherosclerosis (ICD10-I70.0). These results will be called to the ordering clinician or representative by the Radiologist Assistant, and communication documented in the PACS or [employer]. Electronically Signed   By: Keith Rake M.D.   On: 02/15/2022 18:16   ECHOCARDIOGRAM COMPLETE  Result Date: 02/15/2022    ECHOCARDIOGRAM REPORT   Patient Name:   Patrick [name] Diloreto Date of Exam: 02/15/2022 Medical Rec #:  [id number]    Height:       72.0 in  Accession #:    [id number]  Weight:       247.8 lb Date of Birth:  [birth year]   BSA:          2.334 m Patient Age:    35 years     BP:           106/65 mmHg Patient Gender: M            HR:           104 bpm. Exam Location:  Inpatient Procedure: 2D Echo, Color Doppler and Cardiac Doppler Indications:    Fluid overload [[id number]]  History:        Patient has no prior history of Echocardiogram examinations.                 CAD, Prior CABG; Risk Factors:Diabetes.  Sonographer:    Greer Pickerel Referring Phys: [id number] Saverio Danker  Sonographer Comments: Patient is obese. Image acquisition challenging due to respiratory motion. IMPRESSIONS  1. Left ventricular ejection fraction, by estimation, is 50%. The left ventricle has mildly decreased function. The left ventricle demonstrates global hypokinesis with septal bounce suggesting prior cardiac surgery. Left ventricular diastolic parameters  are consistent with Grade II diastolic dysfunction (pseudonormalization).  2. Peak RV-RA gradient 14 mmHg. Right ventricular systolic function is normal. The right ventricular size is normal.  3. Left atrial size was moderately dilated.  4. Right atrial size was mildly dilated.  5. The mitral valve is normal in structure. No evidence of mitral valve regurgitation. No evidence of mitral stenosis.  6. The aortic valve is tricuspid. Aortic valve regurgitation is not visualized. No aortic stenosis is present.  7. Aortic dilatation noted. There is mild dilatation of the ascending aorta, measuring 40 mm.  8. Technically difficult study with poor acoustic windows. FINDINGS  Left Ventricle: Left ventricular ejection fraction, by estimation, is 50%. The left ventricle has mildly decreased function. The left ventricle demonstrates global hypokinesis. The left ventricular internal cavity size was normal in size. There is no left ventricular hypertrophy. Left ventricular diastolic parameters are consistent with Grade II diastolic dysfunction  (pseudonormalization). Right Ventricle: Peak RV-RA gradient 14 mmHg. The right ventricular size is normal. No increase in right ventricular [place]ase thickness. Right ventricular systolic function is normal. Left Atrium: Left atrial size was moderately dilated. Right Atrium: Right atrial size was mildly dilated. Pericardium: There is no evidence of pericardial effusion. Mitral Valve: The mitral valve is normal in structure. No evidence of mitral valve regurgitation. No evidence of mitral valve stenosis. Tricuspid Valve: The tricuspid valve is normal in structure. Tricuspid valve regurgitation is trivial. Aortic Valve: The aortic valve is tricuspid. Aortic valve regurgitation is not visualized. No aortic stenosis is present. Pulmonic Valve: The pulmonic valve was normal in structure. Pulmonic valve regurgitation is not visualized. Aorta: The aortic root is normal in size and structure and aortic dilatation noted. There is mild dilatation of the ascending aorta, measuring 40 mm. Venous: The inferior vena cava was not well visualized. IAS/Shunts: No atrial level shunt detected by color flow Doppler.  LEFT VENTRICLE PLAX 2D LVIDd:         5.30 cm   Diastology LVIDs:         3.70 cm   LV e' medial:    10.90 cm/s LV PW:         1.10 cm   LV E/e' medial:  11.3 LV IVS:        0.80 cm   LV e' lateral:   16.60 cm/s LVOT diam:  2.40 cm   LV E/e' lateral: 7.4 LV SV:         86 LV SV Index:   37 LVOT Area:     4.52 cm  RIGHT VENTRICLE RV S prime:     12.30 cm/s TAPSE (M-mode): 2.2 cm LEFT ATRIUM            Index        RIGHT ATRIUM           Index LA diam:      4.90 cm  2.10 cm/m   RA Area:     19.40 cm LA Vol (A2C): 47.4 ml  20.31 ml/m  RA Volume:   48.70 ml  20.87 ml/m LA Vol (A4C): 100.0 ml 42.85 ml/m  AORTIC VALVE             PULMONIC VALVE LVOT Vmax:   120.00 cm/s PR End Diast Vel: 1.83 msec LVOT Vmean:  80.400 cm/s LVOT VTI:    0.191 m  AORTA Ao Root diam: 3.80 cm Ao Asc diam:  4.00 cm MITRAL VALVE                 TRICUSPID VALVE MV Area (PHT): 2.33 cm     TR Peak grad:   14.6 mmHg MV Decel Time: 325 msec     TR Vmax:        191.00 cm/s MV E velocity: 123.00 cm/s MV A velocity: 43.50 cm/s   SHUNTS MV E/A ratio:  2.83         Systemic VTI:  0.19 m                             Systemic Diam: 2.40 cm Dalton McleanMD Electronically signed by Franki Monte Signature Date/Time: 02/15/2022/5:47:20 PM    Final    DG CHEST PORT 1 VIEW  Result Date: 02/15/2022 CLINICAL DATA:  Chest pain and hypoxia. Status post colectomy and colostomy creation. EXAM: PORTABLE CHEST 1 VIEW COMPARISON:  CT abdomen 01/31/2022 and abdominal radiograph 02/06/2022 FINDINGS: Low lung volumes with some elevation of left hemidiaphragm. Patchy densities at the right lung base most likely represent atelectasis. Persistent densities at the left lung base. There is a right arm PICC line with the tip near the superior cavoatrial junction. Heart size appears to be upper limits of normal with post CABG changes. IMPRESSION: Low lung volumes with bibasilar lung densities. Findings could represent a combination of atelectasis and consolidation. Cannot exclude pleural effusions. Electronically Signed   By: Markus Daft M.D.   On: 02/15/2022 11:15        Scheduled Meds:  alteplase  2 mg Intracatheter Once   Chlorhexidine Gluconate Cloth  6 each Topical Daily   [START ON 02/17/2022] enoxaparin (LOVENOX) injection  60 mg Subcutaneous Q24H   insulin aspart  0-15 Units Subcutaneous Q4H   sodium chloride flush  10-40 mL Intracatheter Q12H   Continuous Infusions:  sodium chloride Stopped (02/14/2022 1602)   albumin human 25 g (02/16/22 0905)   piperacillin-tazobactam (ZOSYN)  IV 12.5 mL/hr at 02/16/22 0800   TPN ADULT (ION) 100 mL/hr at 02/16/22 0800   TPN ADULT (ION)       LOS: 7 days    Time spent: 45 minutes spent on chart review, discussion with nursing staff, consultants, updating family and interview/physical exam; more than 50% of that time was  spent in counseling and/or coordination of care.    Tomi Bamberger  Eliseo Squires, DO [facility] Available via Epic secure chat 7am-7pm After these hours, please refer to coverage provider listed on [email or web address] 02/16/2022, 12:15 PM

## 2022-02-16 NOTE — Procedures (Signed)
Interventional Radiology Procedure Note  Procedure: Image guided drain placement, left upper quadrant.  7F pigtail drain.  Complications: None  EBL: None Findings: ~350cc of purulent material  Recommendations: - Routine drain care, with sterile flushes, record output - routine wound care  Signed,  Dulcy Fanny. Earleen Newport, DO

## 2022-02-16 NOTE — Progress Notes (Addendum)
Patient ID: Patrick [name], male   DOB: [birth year], 52 y.o.   MRN: [id number] 6 Days Post-Op    Subjective: Passing gas in ostomy.  No nausea.  Tolerated some clears yesterday.  Feels a little better today.  T max 101.0.  O2, 3.5L, 92%.     Objective: Vital signs in last 24 hours: Temp:  [99.3 F (37.4 C)-101.9 F (38.8 C)] 100 F (37.8 C) (01/30 0800) Pulse Rate:  [104-121] 113 (01/30 0700) Resp:  [14-23] 19 (01/30 0700) BP: (92-115)/(54-69) 107/62 (01/30 0700) SpO2:  [87 %-98 %] 92 % (01/30 0700) Last BM Date :  (pta)  Intake/Output from previous day: 01/29 0701 - 01/30 0700 In: 2921.5 [I.V.:2412.2; IV Piggyback:509.2] Out: 1740 [Urine:1600; Drains:125; Stool:15] Intake/Output this shift: Total I/O In: -  Out: 200 [Urine:200]  Gen: NAD, but appears tired, fatigued Heart: regular, but tachy Lungs: CTAB, 3.5L [place], sats in low 90s. Abd: soft, wound looks better already today with WD with less fibrin on edges.  Appropriately tender.  Some erythema around edges of wound c/w irritation from sponge from VAC.  JP drain with cloudy more tan/bloody output today than yesterday. 125cc.  GU: foley in place with orange colored urine with some sediment. Ext: BLE edema Skin: some jaundice and diffuse anasarca Psych: A&Ox3  Lab Results: CBC  Recent Labs    02/15/22 0441 02/16/22 0520  WBC 16.1* 16.3*  HGB 7.8* 7.4*  HCT 25.2* 24.8*  PLT 378 389   BMET Recent Labs    02/15/22 0441 02/16/22 0520  NA 134* 134*  K 3.8 4.2  CL 101 101  CO2 25 26  GLUCOSE 210* 209*  BUN 24* 36*  CREATININE 1.51* 2.02*  CALCIUM 7.3* 7.3*   PT/INR Recent Labs    02/16/22 0631  LABPROT 16.7*  INR 1.4*   ABG No results for input(s): "PHART", "HCO3" in the last 72 hours.  Invalid input(s): "PCO2", "PO2"  Studies/Results: CT ABDOMEN PELVIS WO CONTRAST  Result Date: 02/15/2022 CLINICAL DATA:  Abdominal pain, post-op s/p SBR and transverse colectomy for large retroperitoneal mass, fevers,  leukocytosis. EXAM: CT ABDOMEN AND PELVIS WITHOUT CONTRAST TECHNIQUE: Multidetector CT imaging of the abdomen and pelvis was performed following the standard protocol without IV contrast. RADIATION DOSE REDUCTION: This exam was performed according to the departmental dose-optimization program which includes automated exposure control, adjustment of the mA and/or kV according to patient size and/or use of iterative reconstruction technique. COMPARISON:  CT 02/17/2022 FINDINGS: Lower chest: Bilateral lower lobe airspace disease, left greater than right. The left lower lobe pulmonary parenchyma is likely heterogeneous. Small left pleural effusion. Decreased density of the blood pool suggests anemia. Hepatobiliary: No evidence of focal hepatic abnormality on this unenhanced exam. Layering gallstones in the gallbladder which is mildly distended. No biliary dilatation. Pancreas: No ductal dilatation or inflammation. Spleen: Subcapsular fluid collection, inferiorly containing air measures approximately 5.1 x 3.7 x 3.1 cm, series 3, image 35 and series 6, image 2018. The spleen is normal in size. Adrenals/Urinary Tract: Normal adrenal glands. No hydronephrosis. Mild bilateral perinephric edema. Minimally distended urinary bladder. Foley catheter is in place, balloon is likely in the bladder. Air in the bladder is consistent with Foley placement. Stomach/Bowel: Lack of enteric contrast limits detailed bowel assessment. Interval transverse colostomy. There is no small bowel obstruction or postoperative ileus. The appendix is tentatively visualized and normal. Surgical drain entering from the right paramidline courses into the left abdomen and terminates in the left upper quadrant anteriorly. Just  distal to the drain is a large heterogeneous ill-defined air fluid collection. Exact size measurement is difficult due to the ill-defined nature, however representative measurements of 12.4 x 13.9 x 14.9 cm, series 3, image 35 and  series 6, image 37. This is at the level of the transverse colonic staple line. This may be contiguous with a component extending into the left upper quadrant series 3, image 29. Ill-defined air fluid collection in the left pericolic gutter measures approximately 7.4 x 4.3 x 8.4 cm, series 3, image 49 and series 6, image 85. There are multiple scattered small fluid collections and ill-defined fluid as well as mottled air within the right upper quadrant abdominal fat. Vascular/Lymphatic: Normal caliber abdominal aorta with mild atherosclerosis. Large presumed lymph node adjacent to the celiac axis measures 3.7 cm series 3, image 23. There are multifocal prominent and mildly enlarged retroperitoneal nodes, including 13 mm aortocaval node series 3, image 39. Prominent and mildly enlarged mesenteric, periportal and upper abdominal nodes measuring up to 14 mm series 3, image 32 adjacent to the fluid collection. Reproductive: No prostatic mass. Other: Recent postsurgical change with open midline abdominal wound. Scattered foci of free air deep to the abdominal wound as well as patchy air within the right upper abdominal fat. Abdominopelvic fluid collections as described above. Scattered areas of non organized free fluid in the upper abdomen and pelvis. Generalized body Lotter edema consistent with anasarca. Musculoskeletal: There are no acute or suspicious osseous abnormalities. IMPRESSION: 1. Interval transverse colostomy. Large ill-defined air fluid collection just distal to the surgical drain in the left upper quadrant adjacent to the transverse colonic suture line measuring up to 14.9 cm, suspicious for abscess. There are multiple additional smaller air-fluid collections in the abdomen as described. Areas of non organized free fluid are also seen which are indeterminate. 2. Subcapsular fluid collection about the spleen inferiorly containing air measuring 5.1 x 3.7 x 3.1 cm, suspicious for abscess. 3. Bilateral lower  lobe airspace disease, left greater than right, greater than typically seen with atelectasis and suspicious for pneumonia. Small left pleural effusion. 4. Recent postsurgical change with open midline abdominal wound. Scattered foci of free air deep to the abdominal wound as well as patchy air within the right upper quadrant abdominal fat, likely postsurgical. 5. Cholelithiasis without CT findings of acute cholecystitis. 6. Abdominal adenopathy with a dominant lymph node adjacent to the celiac axis. Prominent and mildly enlarged retroperitoneal, mesenteric, periportal, and upper abdominal nodes, nonspecific. 7. Generalized body Corvera edema consistent with anasarca. Aortic Atherosclerosis (ICD10-I70.0). These results will be called to the ordering clinician or representative by the Radiologist Assistant, and communication documented in the PACS or [employer]. Electronically Signed   By: Keith Rake M.D.   On: 02/15/2022 18:16   ECHOCARDIOGRAM COMPLETE  Result Date: 02/15/2022    ECHOCARDIOGRAM REPORT   Patient Name:   RAHEIM BEUTLER Slinker Date of Exam: 02/15/2022 Medical Rec #:  [id number]    Height:       72.0 in Accession #:    [id number]   Weight:       247.8 lb Date of Birth:  1970-02-20   BSA:          2.334 m Patient Age:    51 years     BP:           106/65 mmHg Patient Gender: M            HR:  104 bpm. Exam Location:  Inpatient Procedure: 2D Echo, Color Doppler and Cardiac Doppler Indications:    Fluid overload [161096]  History:        Patient has no prior history of Echocardiogram examinations.                 CAD, Prior CABG; Risk Factors:Diabetes.  Sonographer:    Greer Pickerel Referring Phys: [id number] Saverio Danker  Sonographer Comments: Patient is obese. Image acquisition challenging due to respiratory motion. IMPRESSIONS  1. Left ventricular ejection fraction, by estimation, is 50%. The left ventricle has mildly decreased function. The left ventricle demonstrates global hypokinesis with septal  bounce suggesting prior cardiac surgery. Left ventricular diastolic parameters  are consistent with Grade II diastolic dysfunction (pseudonormalization).  2. Peak RV-RA gradient 14 mmHg. Right ventricular systolic function is normal. The right ventricular size is normal.  3. Left atrial size was moderately dilated.  4. Right atrial size was mildly dilated.  5. The mitral valve is normal in structure. No evidence of mitral valve regurgitation. No evidence of mitral stenosis.  6. The aortic valve is tricuspid. Aortic valve regurgitation is not visualized. No aortic stenosis is present.  7. Aortic dilatation noted. There is mild dilatation of the ascending aorta, measuring 40 mm.  8. Technically difficult study with poor acoustic windows. FINDINGS  Left Ventricle: Left ventricular ejection fraction, by estimation, is 50%. The left ventricle has mildly decreased function. The left ventricle demonstrates global hypokinesis. The left ventricular internal cavity size was normal in size. There is no left ventricular hypertrophy. Left ventricular diastolic parameters are consistent with Grade II diastolic dysfunction (pseudonormalization). Right Ventricle: Peak RV-RA gradient 14 mmHg. The right ventricular size is normal. No increase in right ventricular Defina thickness. Right ventricular systolic function is normal. Left Atrium: Left atrial size was moderately dilated. Right Atrium: Right atrial size was mildly dilated. Pericardium: There is no evidence of pericardial effusion. Mitral Valve: The mitral valve is normal in structure. No evidence of mitral valve regurgitation. No evidence of mitral valve stenosis. Tricuspid Valve: The tricuspid valve is normal in structure. Tricuspid valve regurgitation is trivial. Aortic Valve: The aortic valve is tricuspid. Aortic valve regurgitation is not visualized. No aortic stenosis is present. Pulmonic Valve: The pulmonic valve was normal in structure. Pulmonic valve regurgitation is  not visualized. Aorta: The aortic root is normal in size and structure and aortic dilatation noted. There is mild dilatation of the ascending aorta, measuring 40 mm. Venous: The inferior vena cava was not well visualized. IAS/Shunts: No atrial level shunt detected by color flow Doppler.  LEFT VENTRICLE PLAX 2D LVIDd:         5.30 cm   Diastology LVIDs:         3.70 cm   LV e' medial:    10.90 cm/s LV PW:         1.10 cm   LV E/e' medial:  11.3 LV IVS:        0.80 cm   LV e' lateral:   16.60 cm/s LVOT diam:     2.40 cm   LV E/e' lateral: 7.4 LV SV:         86 LV SV Index:   37 LVOT Area:     4.52 cm  RIGHT VENTRICLE RV S prime:     12.30 cm/s TAPSE (M-mode): 2.2 cm LEFT ATRIUM            Index        RIGHT ATRIUM  Index LA diam:      4.90 cm  2.10 cm/m   RA Area:     19.40 cm LA Vol (A2C): 47.4 ml  20.31 ml/m  RA Volume:   48.70 ml  20.87 ml/m LA Vol (A4C): 100.0 ml 42.85 ml/m  AORTIC VALVE             PULMONIC VALVE LVOT Vmax:   120.00 cm/s PR End Diast Vel: 1.83 msec LVOT Vmean:  80.400 cm/s LVOT VTI:    0.191 m  AORTA Ao Root diam: 3.80 cm Ao Asc diam:  4.00 cm MITRAL VALVE                TRICUSPID VALVE MV Area (PHT): 2.33 cm     TR Peak grad:   14.6 mmHg MV Decel Time: 325 msec     TR Vmax:        191.00 cm/s MV E velocity: 123.00 cm/s MV A velocity: 43.50 cm/s   SHUNTS MV E/A ratio:  2.83         Systemic VTI:  0.19 m                             Systemic Diam: 2.40 cm Dalton McleanMD Electronically signed by Franki Monte Signature Date/Time: 02/15/2022/5:47:20 PM    Final    DG CHEST PORT 1 VIEW  Result Date: 02/15/2022 CLINICAL DATA:  Chest pain and hypoxia. Status post colectomy and colostomy creation. EXAM: PORTABLE CHEST 1 VIEW COMPARISON:  CT abdomen 02/07/2022 and abdominal radiograph 02/06/2022 FINDINGS: Low lung volumes with some elevation of left hemidiaphragm. Patchy densities at the right lung base most likely represent atelectasis. Persistent densities at the left lung base.  There is a right arm PICC line with the tip near the superior cavoatrial junction. Heart size appears to be upper limits of normal with post CABG changes. IMPRESSION: Low lung volumes with bibasilar lung densities. Findings could represent a combination of atelectasis and consolidation. Cannot exclude pleural effusions. Electronically Signed   By: Markus Daft M.D.   On: 02/15/2022 11:15    Anti-infectives: Anti-infectives (From admission, onward)    Start     Dose/Rate Route Frequency Ordered Stop   02/15/22 1500  piperacillin-tazobactam (ZOSYN) IVPB 3.375 g        3.375 g 12.5 mL/hr over 240 Minutes Intravenous Every 8 hours 02/15/22 0836 02/17/22 0659   02/15/2022 1100  piperacillin-tazobactam (ZOSYN) IVPB 3.375 g        3.375 g 100 mL/hr over 30 Minutes Intravenous  Once 02/11/2022 1056 02/07/2022 1230   01/18/2022 0200  piperacillin-tazobactam (ZOSYN) IVPB 3.375 g  Status:  Discontinued        3.375 g 12.5 mL/hr over 240 Minutes Intravenous Every 8 hours 02/12/2022 2322 02/15/22 0836   01/28/2022 1900  piperacillin-tazobactam (ZOSYN) IVPB 3.375 g        3.375 g 100 mL/hr over 30 Minutes Intravenous  Once 01/19/2022 1857 02/05/2022 2012       Assessment/Plan: 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  Discussed fluid collections and possible need for intervention pending drain output, pus vs serous vs feculent. -conts to remain febrile. - TNA - JP cloudy today - BID dressing changes to midline wound  to help debride some slough - WOC following for new ostomy - continue IV abx - surgical path is pending  -PT/OT evals -CBC in am  AKI - up to 2.  Still making urine.  ? Secondary to some hypotension.  Not really on anything nephrotoxic at this time.  Reviewed all medications with pharmacy and medicine today and nothing that needs to be adjusted at this time.  Albumin per medicine.  Recheck labs in am   Fluid overload/hypoalbuminemia - 13L + this admission.  Cont full rate TNA.  Stop LR at 20cc/hr.  Albumin per medicine.  Echo reviewed and EF preserved but with some hypokinesis. LLL PNA - pulm toilet. IS.  Noted on CT.  On zosyn already which should cover this for now. CAD, s/p CABG - no chest pain, echo as above DM - SSI Hyperbilirubinemia - unclear etiology.  Up to 7 today.  Fractionate out his TB suggests more obstructive patter with elevated direct bilirubin.  Gallstones noted on CT, but no ductal dilatation.  Other LFTs essentially normal.  CMET in am Chronic osteomyelitis of left foot - s/p amputation  FEN: NPO for hopeful procedure/TNA/albumin VTE: LMWH ID: Zosyn 1/23>> monitor WBC remains at 16K     LOS: 7 days    Henreitta Cea PA-C 9:11 AM 02/16/2022  Use [email or web address] to contact on call provider  02/16/2022

## 2022-02-17 ENCOUNTER — Inpatient Hospital Stay (HOSPITAL_COMMUNITY): Payer: BC Managed Care – PPO

## 2022-02-17 DIAGNOSIS — R1900 Intra-abdominal and pelvic swelling, mass and lump, unspecified site: Secondary | ICD-10-CM | POA: Diagnosis not present

## 2022-02-17 LAB — BRAIN NATRIURETIC PEPTIDE: B Natriuretic Peptide: 215.8 pg/mL — ABNORMAL HIGH (ref 0.0–100.0)

## 2022-02-17 LAB — POCT I-STAT 7, (LYTES, BLD GAS, ICA,H+H)
Acid-Base Excess: 2 mmol/L (ref 0.0–2.0)
Bicarbonate: 26 mmol/L (ref 20.0–28.0)
Calcium, Ion: 1.12 mmol/L — ABNORMAL LOW (ref 1.15–1.40)
HCT: 26 % — ABNORMAL LOW (ref 39.0–52.0)
Hemoglobin: 8.8 g/dL — ABNORMAL LOW (ref 13.0–17.0)
O2 Saturation: 92 %
Patient temperature: 98.6
Potassium: 4.3 mmol/L (ref 3.5–5.1)
Sodium: 138 mmol/L (ref 135–145)
TCO2: 27 mmol/L (ref 22–32)
pCO2 arterial: 39.3 mmHg (ref 32–48)
pH, Arterial: 7.428 (ref 7.35–7.45)
pO2, Arterial: 62 mmHg — ABNORMAL LOW (ref 83–108)

## 2022-02-17 LAB — COMPREHENSIVE METABOLIC PANEL
ALT: 19 U/L (ref 0–44)
AST: 41 U/L (ref 15–41)
Albumin: 2.1 g/dL — ABNORMAL LOW (ref 3.5–5.0)
Alkaline Phosphatase: 67 U/L (ref 38–126)
Anion gap: 13 (ref 5–15)
BUN: 41 mg/dL — ABNORMAL HIGH (ref 6–20)
CO2: 22 mmol/L (ref 22–32)
Calcium: 7.5 mg/dL — ABNORMAL LOW (ref 8.9–10.3)
Chloride: 101 mmol/L (ref 98–111)
Creatinine, Ser: 2.25 mg/dL — ABNORMAL HIGH (ref 0.61–1.24)
GFR, Estimated: 35 mL/min — ABNORMAL LOW (ref >60)
Glucose, Bld: 192 mg/dL — ABNORMAL HIGH (ref 70–99)
Potassium: 4.2 mmol/L (ref 3.5–5.1)
Sodium: 136 mmol/L (ref 135–145)
Total Bilirubin: 6.4 mg/dL — ABNORMAL HIGH (ref 0.3–1.2)
Total Protein: 5 g/dL — ABNORMAL LOW (ref 6.5–8.1)

## 2022-02-17 LAB — OSMOLALITY: Osmolality: 408 mOsm/kg (ref 275–295)

## 2022-02-17 LAB — CREATININE, URINE, RANDOM: Creatinine, Urine: 74 mg/dL

## 2022-02-17 LAB — CBC
HCT: 21.4 % — ABNORMAL LOW (ref 39.0–52.0)
Hemoglobin: 6.7 g/dL — CL (ref 13.0–17.0)
MCH: 24.5 pg — ABNORMAL LOW (ref 26.0–34.0)
MCHC: 31.3 g/dL (ref 30.0–36.0)
MCV: 78.4 fL — ABNORMAL LOW (ref 80.0–100.0)
Platelets: 358 10*3/uL (ref 150–400)
RBC: 2.73 MIL/uL — ABNORMAL LOW (ref 4.22–5.81)
RDW: 17.3 % — ABNORMAL HIGH (ref 11.5–15.5)
WBC: 16.9 10*3/uL — ABNORMAL HIGH (ref 4.0–10.5)
nRBC: 0 % (ref 0.0–0.2)

## 2022-02-17 LAB — MAGNESIUM: Magnesium: 2.1 mg/dL (ref 1.7–2.4)

## 2022-02-17 LAB — GLUCOSE, CAPILLARY
Glucose-Capillary: 160 mg/dL — ABNORMAL HIGH (ref 70–99)
Glucose-Capillary: 163 mg/dL — ABNORMAL HIGH (ref 70–99)
Glucose-Capillary: 173 mg/dL — ABNORMAL HIGH (ref 70–99)
Glucose-Capillary: 187 mg/dL — ABNORMAL HIGH (ref 70–99)
Glucose-Capillary: 189 mg/dL — ABNORMAL HIGH (ref 70–99)
Glucose-Capillary: 190 mg/dL — ABNORMAL HIGH (ref 70–99)

## 2022-02-17 LAB — HEPATITIS PANEL, ACUTE
HCV Ab: NONREACTIVE
Hep A IgM: NONREACTIVE
Hep B C IgM: NONREACTIVE
Hepatitis B Surface Ag: NONREACTIVE

## 2022-02-17 LAB — SODIUM, URINE, RANDOM: Sodium, Ur: 29 mmol/L

## 2022-02-17 LAB — OSMOLALITY, URINE: Osmolality, Ur: 375 mOsm/kg (ref 300–900)

## 2022-02-17 LAB — PHOSPHORUS: Phosphorus: 3.7 mg/dL (ref 2.5–4.6)

## 2022-02-17 LAB — PREPARE RBC (CROSSMATCH)

## 2022-02-17 MED ORDER — ZINC CHLORIDE 1 MG/ML IV SOLN
INTRAVENOUS | Status: AC
  Filled 2022-02-17: qty 1320

## 2022-02-17 MED ORDER — HEPARIN SODIUM (PORCINE) 5000 UNIT/ML IJ SOLN: 5000.0000 [IU] | Freq: Three times a day (TID) | INTRAMUSCULAR | Status: DC

## 2022-02-17 MED ORDER — SODIUM CHLORIDE 0.9 % IV SOLN: INTRAVENOUS | Status: DC | PRN

## 2022-02-17 MED ORDER — NOREPINEPHRINE 16 MG/250ML-% IV SOLN
0.0000 ug/min | INTRAVENOUS | Status: DC
  Administered 2022-02-18: 2 ug/min via INTRAVENOUS
  Administered 2022-02-20: 13 ug/min via INTRAVENOUS
  Administered 2022-02-21: 10 ug/min via INTRAVENOUS
  Administered 2022-02-22 – 2022-02-24 (×2): 2 ug/min via INTRAVENOUS
  Administered 2022-02-25: 1 ug/min via INTRAVENOUS
  Administered 2022-03-01: 2 ug/min via INTRAVENOUS
  Filled 2022-02-17 (×4): qty 250

## 2022-02-17 MED ORDER — SODIUM CHLORIDE 0.9% IV SOLUTION: Freq: Once | INTRAVENOUS | Status: AC

## 2022-02-17 MED ORDER — FUROSEMIDE 10 MG/ML IJ SOLN
60.0000 mg | Freq: Once | INTRAMUSCULAR | Status: AC
  Administered 2022-02-17: 60 mg via INTRAVENOUS
  Filled 2022-02-17: qty 6

## 2022-02-17 MED ORDER — FUROSEMIDE 10 MG/ML IJ SOLN
8.0000 mg/h | INTRAVENOUS | Status: DC
  Administered 2022-02-17: 4 mg/h via INTRAVENOUS
  Administered 2022-02-18: 10 mg/h via INTRAVENOUS
  Filled 2022-02-17 (×3): qty 20

## 2022-02-17 MED ORDER — ENOXAPARIN SODIUM 60 MG/0.6ML IJ SOSY
55.0000 mg | PREFILLED_SYRINGE | INTRAMUSCULAR | Status: DC
  Administered 2022-02-17 – 2022-02-26 (×10): 55 mg via SUBCUTANEOUS
  Filled 2022-02-17 (×11): qty 0.6

## 2022-02-17 MED ORDER — PIPERACILLIN-TAZOBACTAM 3.375 G IVPB
3.3750 g | Freq: Three times a day (TID) | INTRAVENOUS | Status: DC
  Administered 2022-02-17 – 2022-03-02 (×39): 3.375 g via INTRAVENOUS
  Filled 2022-02-17 (×39): qty 50

## 2022-02-17 MED ORDER — NOREPINEPHRINE 4 MG/250ML-% IV SOLN: 0.0000 ug/min | INTRAVENOUS | Status: DC

## 2022-02-17 NOTE — Progress Notes (Signed)
RT attempted aline x2 without success. MD made aware.

## 2022-02-17 NOTE — Progress Notes (Signed)
Attempted lower extremity venous duplex. Patient unavailable . Will try again as schedule permits.   Jinny Blossom Vonne Mcdanel 02/17/2022 2:49 PM

## 2022-02-17 NOTE — Consult Note (Signed)
Reason for Consult: Renal failure Referring Physician:  Saverio Danker  Chief Complaint:  Abdominal pain  Assessment/Plan: Renal failure in the setting of surgery for a retroperitoneal mass invading into the transverse colon with no significant drop in MAP during the operation. He is +15L since being hospitalized but fortunately has good UOP.  FeNA less than 1% which can be consistent with heart failure and CRS; he doesn't appear to be hypovolemic. - Will give a trial with diuretics along with the albumin. - Fortunately no absolute indications for RRT and his UOP is still good.   -Monitor Daily I/Os, Daily weight  -Maintain MAP>65 for optimal renal perfusion.  - Dose all meds for creatinine clearance < 30 ml/min  - Unless absolutely necessary, no MRIs with gadolinium.  - Prefer needle sticks in the dorsum of the hands or wrists.  No blood pressure measurements in right arm. - Preferred narcotic agents for pain control are hydromorphone, fentanyl, and methadone. Morphine should not be used. Avoid Baclofen and avoid oral sodium phosphate and magnesium citrate based laxatives / bowel preps. Continue strict Input and Output monitoring. Will monitor the patient closely with you and intervene or adjust therapy as indicated by changes in clinical status/labs    Retroperitoneal mass s/p resection, drainage of abscess as well as CT guided drain of a LUQ abscess on 1/30. Patient currently receiving TPN.  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 TTE shows grade 2 diastolic dysfunction, LVEF 50% w/ global hypokinesis.  DM per primary currently on ISS    HPI: Patrick [name] is an 52 y.o. male DM, diabetic foot infections including osteomyelitis b/l feet w/ multiple debridements, nephrolithiasis, morbid obesity, CASHD s/p CABG x5, recently received contrast at [facility] 02/11/2022 with CT showing small bowel ischemia with pneumatosis and pneumoperitoneum, repeat CT (without contrast) showed severe colitis as  well and possible acute pyelonephritis + pelvic free fluid. Patient had a exploratory laparotomy which showed a retroperitoneal mass invading into the transverse colon, proximal small bowel with ischemic proximal jejunum with perforation and abscess. Patient had resection of the transverse colon, drainage of the abscess, resection of proximal jejunum with small anastomosis and diverting right colostomy. Hospital course complicated by fevers up to 101, atelectasis with possible consolidation as well as worsening renal function. Initially renal dysfunction improved with IVF.  Patient received Lasix '40mg'$  earlier in the hospitalization and then '20mg'$  on 1/29 and 1/30. Patient has been treated with albumin 25gm, Zosyn as well. UOP has remained good with 1759m past 24hrs.  Patient is +15L during this hospitalization.  ROS Pertinent items are noted in HPI.  Chemistry and CBC: Creatinine, Ser  Date/Time Value Ref Range Status  02/17/2022 05:42 AM 2.25 (H) 0.61 - 1.24 mg/dL Final  02/16/2022 05:20 AM 2.02 (H) 0.61 - 1.24 mg/dL Final  02/15/2022 04:41 AM 1.51 (H) 0.61 - 1.24 mg/dL Final  02/14/2022 04:45 AM 1.18 0.61 - 1.24 mg/dL Final  02/13/2022 08:56 AM 1.05 0.61 - 1.24 mg/dL Final  02/13/2022 05:00 AM 0.92 0.61 - 1.24 mg/dL Final  02/12/2022 02:40 AM 1.06 0.61 - 1.24 mg/dL Final  02/11/2022 04:32 AM 1.28 (H) 0.61 - 1.24 mg/dL Final  01/22/2022 05:35 AM 1.40 (H) 0.61 - 1.24 mg/dL Final  01/31/2022 12:02 AM 1.49 (H) 0.61 - 1.24 mg/dL Final  01/22/2022 05:10 PM 1.57 (H) 0.61 - 1.24 mg/dL Final  01/16/2019 08:51 AM 1.00 0.61 - 1.24 mg/dL Final  10/20/2018 10:49 AM 0.81 0.61 - 1.24 mg/dL Final  05/05/2018 12:51  PM 0.94 0.61 - 1.24 mg/dL Final   Recent Labs  Lab 02/12/22 0240 02/13/22 0500 02/13/22 0856 02/14/22 0445 02/15/22 0441 02/16/22 0520 02/17/22 0542 02/17/22 1151  NA 139 133* 134* 138 134* 134* 136 138  K 3.7 5.5* 3.7 3.8 3.8 4.2 4.2 4.3  CL 105 102 102 104 101 101 101  --   CO2 23  21* '23 22 25 26 22  '$ --   GLUCOSE 107* 435* 150* 180* 210* 209* 192*  --   BUN '20 14 15 16 '$ 24* 36* 41*  --   CREATININE 1.06 0.92 1.05 1.18 1.51* 2.02* 2.25*  --   CALCIUM 7.5* 7.4* 7.3* 7.3* 7.3* 7.3* 7.5*  --   PHOS 2.9 4.0 2.5 3.0 2.9  --  3.7  --    Recent Labs  Lab 02/14/22 0445 02/15/22 0441 02/16/22 0520 02/17/22 0542 02/17/22 1151  WBC 16.6* 16.1* 16.3* 16.9*  --   HGB 8.4* 7.8* 7.4* 6.7* 8.8*  HCT 28.4* 25.2* 24.8* 21.4* 26.0*  MCV 81.1 79.7* 78.7* 78.4*  --   PLT 459* 378 389 358  --    Liver Function Tests: Recent Labs  Lab 02/15/22 0441 02/16/22 0631 02/17/22 0542  AST 34  33 50* 41  ALT '18  16 20 19  '$ ALKPHOS 75  75 78 67  BILITOT 5.9*  6.2* 6.8* 6.4*  PROT 4.4*  4.5* 4.6* 5.0*  ALBUMIN 1.6*  1.6* 1.5* 2.1*   No results for input(s): "LIPASE", "AMYLASE" in the last 168 hours. No results for input(s): "AMMONIA" in the last 168 hours. Cardiac Enzymes: No results for input(s): "CKTOTAL", "CKMB", "CKMBINDEX", "TROPONINI" in the last 168 hours. Iron Studies: No results for input(s): "IRON", "TIBC", "TRANSFERRIN", "FERRITIN" in the last 72 hours. PT/INR: '@LABRCNTIP'$ (inr:5)  Xrays/Other Studies: ) Results for orders placed or performed during the hospital encounter of 02/12/2022 (from the past 48 hour(s))  Glucose, capillary     Status: Abnormal   Collection Time: 02/15/22  5:28 PM  Result Value Ref Range   Glucose-Capillary 214 (H) 70 - 99 mg/dL    Comment: Glucose reference range applies only to samples taken after fasting for at least 8 hours.  Glucose, capillary     Status: Abnormal   Collection Time: 02/15/22  8:07 PM  Result Value Ref Range   Glucose-Capillary 186 (H) 70 - 99 mg/dL    Comment: Glucose reference range applies only to samples taken after fasting for at least 8 hours.  Glucose, capillary     Status: Abnormal   Collection Time: 02/15/22 11:56 PM  Result Value Ref Range   Glucose-Capillary 205 (H) 70 - 99 mg/dL    Comment: Glucose  reference range applies only to samples taken after fasting for at least 8 hours.  Glucose, capillary     Status: Abnormal   Collection Time: 02/16/22  3:57 AM  Result Value Ref Range   Glucose-Capillary 216 (H) 70 - 99 mg/dL    Comment: Glucose reference range applies only to samples taken after fasting for at least 8 hours.  CBC     Status: Abnormal   Collection Time: 02/16/22  5:20 AM  Result Value Ref Range   WBC 16.3 (H) 4.0 - 10.5 K/uL   RBC 3.15 (L) 4.22 - 5.81 MIL/uL   Hemoglobin 7.4 (L) 13.0 - 17.0 g/dL   HCT 24.8 (L) 39.0 - 52.0 %   MCV 78.7 (L) 80.0 - 100.0 fL   MCH 23.5 (L) 26.0 - 34.0  pg   MCHC 29.8 (L) 30.0 - 36.0 g/dL   RDW 17.1 (H) 11.5 - 15.5 %   Platelets 389 150 - 400 K/uL   nRBC 0.1 0.0 - 0.2 %    Comment: Performed at [facility] [address], [place], [place] [address]  Basic metabolic panel     Status: Abnormal   Collection Time: 02/16/22  5:20 AM  Result Value Ref Range   Sodium 134 (L) 135 - 145 mmol/L   Potassium 4.2 3.5 - 5.1 mmol/L   Chloride 101 98 - 111 mmol/L   CO2 26 22 - 32 mmol/L   Glucose, Bld 209 (H) 70 - 99 mg/dL    Comment: Glucose reference range applies only to samples taken after fasting for at least 8 hours.   BUN 36 (H) 6 - 20 mg/dL   Creatinine, Ser 2.02 (H) 0.61 - 1.24 mg/dL   Calcium 7.3 (L) 8.9 - 10.3 mg/dL   GFR, Estimated 39 (L) >60 mL/min    Comment: (NOTE) Calculated using the CKD-EPI Creatinine Equation (2021)    Anion gap 7 5 - 15    Comment: Performed at [facility] [address], [place], [place] [address]  Hepatic function panel     Status: Abnormal   Collection Time: 02/16/22  6:31 AM  Result Value Ref Range   Total Protein 4.6 (L) 6.5 - 8.1 g/dL   Albumin 1.5 (L) 3.5 - 5.0 g/dL   AST 50 (H) 15 - 41 U/L   ALT 20 0 - 44 U/L   Alkaline Phosphatase 78 38 - 126 U/L   Total Bilirubin 6.8 (H) 0.3 - 1.2 mg/dL   Bilirubin, Direct 4.7 (H) 0.0 - 0.2 mg/dL   Indirect Bilirubin 2.1 (H) 0.3 - 0.9 mg/dL     Comment: Performed at [facility] [address] [place], [place] [address]  Protime-INR     Status: Abnormal   Collection Time: 02/16/22  6:31 AM  Result Value Ref Range   Prothrombin Time 16.7 (H) 11.4 - 15.2 seconds   INR 1.4 (H) 0.8 - 1.2    Comment: (NOTE) INR goal varies based on device and disease states. Performed at [facility], [place] [address] [place], [place] [address]   Glucose, capillary     Status: Abnormal   Collection Time: 02/16/22  8:12 AM  Result Value Ref Range   Glucose-Capillary 208 (H) 70 - 99 mg/dL    Comment: Glucose reference range applies only to samples taken after fasting for at least 8 hours.  Glucose, capillary     Status: Abnormal   Collection Time: 02/16/22 10:53 AM  Result Value Ref Range   Glucose-Capillary 188 (H) 70 - 99 mg/dL    Comment: Glucose reference range applies only to samples taken after fasting for at least 8 hours.  Glucose, capillary     Status: Abnormal   Collection Time: 02/16/22  3:08 PM  Result Value Ref Range   Glucose-Capillary 168 (H) 70 - 99 mg/dL    Comment: Glucose reference range applies only to samples taken after fasting for at least 8 hours.  Glucose, capillary     Status: Abnormal   Collection Time: 02/16/22  8:10 PM  Result Value Ref Range   Glucose-Capillary 171 (H) 70 - 99 mg/dL    Comment: Glucose reference range applies only to samples taken after fasting for at least 8 hours.  Glucose, capillary  Status: Abnormal   Collection Time: 02/16/22 11:54 PM  Result Value Ref Range   Glucose-Capillary 166 (H) 70 - 99 mg/dL    Comment: Glucose reference range applies only to samples taken after fasting for at least 8 hours.  Glucose, capillary     Status: Abnormal   Collection Time: 02/17/22  4:01 AM  Result Value Ref Range   Glucose-Capillary 187 (H) 70 - 99 mg/dL    Comment: Glucose reference range applies only to samples taken after fasting for at least 8 hours.  CBC     Status: Abnormal    Collection Time: 02/17/22  5:42 AM  Result Value Ref Range   WBC 16.9 (H) 4.0 - 10.5 K/uL   RBC 2.73 (L) 4.22 - 5.81 MIL/uL   Hemoglobin 6.7 (LL) 13.0 - 17.0 g/dL    Comment: REPEATED TO VERIFY THIS CRITICAL RESULT HAS VERIFIED AND BEEN CALLED TO Cyndi Lennert RN BY FARZANEH AMIREHSANI ON 01 31 2024 AT 0612, AND HAS BEEN READ BACK.     HCT 21.4 (L) 39.0 - 52.0 %   MCV 78.4 (L) 80.0 - 100.0 fL   MCH 24.5 (L) 26.0 - 34.0 pg   MCHC 31.3 30.0 - 36.0 g/dL   RDW 17.3 (H) 11.5 - 15.5 %   Platelets 358 150 - 400 K/uL   nRBC 0.0 0.0 - 0.2 %    Comment: Performed at [facility] [address], [place], [place] [address]  Comprehensive metabolic panel     Status: Abnormal   Collection Time: 02/17/22  5:42 AM  Result Value Ref Range   Sodium 136 135 - 145 mmol/L   Potassium 4.2 3.5 - 5.1 mmol/L   Chloride 101 98 - 111 mmol/L   CO2 22 22 - 32 mmol/L   Glucose, Bld 192 (H) 70 - 99 mg/dL    Comment: Glucose reference range applies only to samples taken after fasting for at least 8 hours.   BUN 41 (H) 6 - 20 mg/dL   Creatinine, Ser 2.25 (H) 0.61 - 1.24 mg/dL   Calcium 7.5 (L) 8.9 - 10.3 mg/dL   Total Protein 5.0 (L) 6.5 - 8.1 g/dL   Albumin 2.1 (L) 3.5 - 5.0 g/dL   AST 41 15 - 41 U/L   ALT 19 0 - 44 U/L   Alkaline Phosphatase 67 38 - 126 U/L   Total Bilirubin 6.4 (H) 0.3 - 1.2 mg/dL   GFR, Estimated 35 (L) >60 mL/min    Comment: (NOTE) Calculated using the CKD-EPI Creatinine Equation (2021)    Anion gap 13 5 - 15    Comment: Performed at [facility] [address] [place], [place] [address]  Magnesium     Status: None   Collection Time: 02/17/22  5:42 AM  Result Value Ref Range   Magnesium 2.1 1.7 - 2.4 mg/dL    Comment: Performed at [facility] [address], [place], [place] [address]  Phosphorus     Status: None   Collection Time: 02/17/22  5:42 AM  Result Value Ref Range   Phosphorus 3.7 2.5 - 4.6 mg/dL    Comment: Performed at [facility] [address] [place], [place] [address]  Hepatitis panel, acute     Status: None   Collection Time: 02/17/22  5:42 AM  Result Value Ref Range   Hepatitis B Surface Ag NON REACTIVE NON REACTIVE   HCV Ab NON REACTIVE NON REACTIVE  Comment: (NOTE) Nonreactive HCV antibody screen is consistent with no HCV infections,  unless recent infection is suspected or other evidence exists to indicate HCV infection.     Hep A IgM NON REACTIVE NON REACTIVE   Hep B C IgM NON REACTIVE NON REACTIVE    Comment: Performed at [facility], [place] [address] [place], [place] [address]  Brain natriuretic peptide     Status: Abnormal   Collection Time: 02/17/22  5:42 AM  Result Value Ref Range   B Natriuretic Peptide 215.8 (H) 0.0 - 100.0 pg/mL    Comment: Performed at [facility] [address] [place], [place] [address]  Prepare RBC (crossmatch)     Status: None   Collection Time: 02/17/22  7:13 AM  Result Value Ref Range   Order Confirmation      ORDER PROCESSED BY BLOOD BANK Performed at [facility], [address] [place], [place] [address]   Type and screen [facility]     Status: None (Preliminary result)   Collection Time: 02/17/22  7:13 AM  Result Value Ref Range   ABO/RH(D) O POS    Antibody Screen NEG    Sample Expiration 02/20/2022,2359    Unit Number [id number]    Blood Component Type RED CELLS,LR    Unit division 00    Status of Unit ISSUED    Transfusion Status OK TO TRANSFUSE    Crossmatch Result Compatible    Unit Number [id number]    Blood Component Type RED CELLS,LR    Unit division 00    Status of Unit ISSUED    Transfusion Status OK TO TRANSFUSE    Crossmatch Result      Compatible Performed at [facility], [place] [address], [place], [place] [address]   Glucose, capillary     Status: Abnormal   Collection Time: 02/17/22  8:23 AM  Result Value Ref Range   Glucose-Capillary 163 (H) 70 - 99 mg/dL    Comment: Glucose reference  range applies only to samples taken after fasting for at least 8 hours.  Glucose, capillary     Status: Abnormal   Collection Time: 02/17/22 11:13 AM  Result Value Ref Range   Glucose-Capillary 190 (H) 70 - 99 mg/dL    Comment: Glucose reference range applies only to samples taken after fasting for at least 8 hours.  I-STAT 7, (LYTES, BLD GAS, ICA, H+H)     Status: Abnormal   Collection Time: 02/17/22 11:51 AM  Result Value Ref Range   pH, Arterial 7.428 7.35 - 7.45   pCO2 arterial 39.3 32 - 48 mmHg   pO2, Arterial 62 (L) 83 - 108 mmHg   Bicarbonate 26.0 20.0 - 28.0 mmol/L   TCO2 27 22 - 32 mmol/L   O2 Saturation 92 %   Acid-Base Excess 2.0 0.0 - 2.0 mmol/L   Sodium 138 135 - 145 mmol/L   Potassium 4.3 3.5 - 5.1 mmol/L   Calcium, Ion 1.12 (L) 1.15 - 1.40 mmol/L   HCT 26.0 (L) 39.0 - 52.0 %   Hemoglobin 8.8 (L) 13.0 - 17.0 g/dL   Patient temperature 98.6 F    Collection site RADIAL, ALLEN'S TEST ACCEPTABLE    Drawn by RT    Sample type ARTERIAL   Creatinine, urine, random     Status: None   Collection Time: 02/17/22 12:27 PM  Result Value Ref Range   Creatinine, Urine 74 mg/dL  Comment: Performed at [facility], [place] [address], [place], [place] [address]  Sodium, urine, random     Status: None   Collection Time: 02/17/22 12:27 PM  Result Value Ref Range   Sodium, Ur 29 mmol/L    Comment: Performed at [facility] [address], [place], [place] [address]   DG CHEST PORT 1 VIEW  Result Date: 02/17/2022 CLINICAL DATA:  Hypoxia. EXAM: PORTABLE CHEST 1 VIEW COMPARISON:  February 15, 2022. FINDINGS: Stable cardiomegaly. Status post coronary bypass graft. Stable probable right basilar subsegmental atelectasis. Stable left basilar atelectasis or infiltrate is noted with associated pleural effusion. Bony thorax is unremarkable. IMPRESSION: Stable bilateral lung opacities as noted above. Electronically Signed   By: Marijo Conception M.D.   On: 02/17/2022 11:57   CT GUIDED  VISCERAL FLUID DRAIN BY PERC CATH  Result Date: 02/16/2022 INDICATION: 52 year old male abdominal abscess referred for drainage EXAM: CT GUIDED DRAINAGE OF  ABSCESS MEDICATIONS: The patient is currently admitted to the hospital and receiving intravenous antibiotics. The antibiotics were administered within an appropriate time frame prior to the initiation of the procedure. ANESTHESIA/SEDATION: 0.5 mg IV Versed 0 mcg IV Fentanyl Moderate Sedation Time:  0 minutes The patient was continuously monitored during the procedure by the interventional radiology nurse under my direct supervision. COMPLICATIONS: None TECHNIQUE: Informed written consent was obtained from the patient after a thorough discussion of the procedural risks, benefits and alternatives. All questions were addressed. Maximal Sterile Barrier Technique was utilized including caps, mask, sterile gowns, sterile gloves, sterile drape, hand hygiene and skin antiseptic. A timeout was performed prior to the initiation of the procedure. PROCEDURE: The operative field was prepped with Chlorhexidine in a sterile fashion, and a sterile drape was applied covering the operative field. A sterile gown and sterile gloves were used for the procedure. Local anesthesia was provided with 1% Lidocaine. Patient was positioned supine on the CT gantry table. Scout CT acquired for planning purposes. Patient is prepped and draped in the usual sterile fashion. 1% lidocaine was used for local anesthesia. Using CT guidance and modified Seldinger technique, a 12 French drain was placed into the fluid and gas collection of the left upper quadrant. Once the drain was in position, proximally 350 cc of frankly purulent material was evacuated. A gravity drain was attached given the presumed bowel injury/tumor. Drain was sutured in position. Patient tolerated the procedure well and remained hemodynamically stable throughout. No complications were encountered and no significant blood loss.  FINDINGS: Planning CT demonstrates fluid and gas collection of the left upper quadrant. 350 cc of frankly purulent material aspirated. Final CT demonstrates adequate positioning of the drain with partial decompression of the cavity. IMPRESSION: Status post CT-guided drainage of left upper quadrant abscess. Signed, Dulcy Fanny. Nadene Rubins, RPVI [facility] Electronically Signed   By: Corrie Mckusick D.O.   On: 02/16/2022 12:47   CT ABDOMEN PELVIS WO CONTRAST  Result Date: 02/15/2022 CLINICAL DATA:  Abdominal pain, post-op s/p SBR and transverse colectomy for large retroperitoneal mass, fevers, leukocytosis. EXAM: CT ABDOMEN AND PELVIS WITHOUT CONTRAST TECHNIQUE: Multidetector CT imaging of the abdomen and pelvis was performed following the standard protocol without IV contrast. RADIATION DOSE REDUCTION: This exam was performed according to the departmental dose-optimization program which includes automated exposure control, adjustment of the mA and/or kV according to patient size and/or use of iterative reconstruction technique. COMPARISON:  CT 02/16/2022 FINDINGS: Lower chest: Bilateral lower lobe  airspace disease, left greater than right. The left lower lobe pulmonary parenchyma is likely heterogeneous. Small left pleural effusion. Decreased density of the blood pool suggests anemia. Hepatobiliary: No evidence of focal hepatic abnormality on this unenhanced exam. Layering gallstones in the gallbladder which is mildly distended. No biliary dilatation. Pancreas: No ductal dilatation or inflammation. Spleen: Subcapsular fluid collection, inferiorly containing air measures approximately 5.1 x 3.7 x 3.1 cm, series 3, image 35 and series 6, image 2018. The spleen is normal in size. Adrenals/Urinary Tract: Normal adrenal glands. No hydronephrosis. Mild bilateral perinephric edema. Minimally distended urinary bladder. Foley catheter is in place, balloon is  likely in the bladder. Air in the bladder is consistent with Foley placement. Stomach/Bowel: Lack of enteric contrast limits detailed bowel assessment. Interval transverse colostomy. There is no small bowel obstruction or postoperative ileus. The appendix is tentatively visualized and normal. Surgical drain entering from the right paramidline courses into the left abdomen and terminates in the left upper quadrant anteriorly. Just distal to the drain is a large heterogeneous ill-defined air fluid collection. Exact size measurement is difficult due to the ill-defined nature, however representative measurements of 12.4 x 13.9 x 14.9 cm, series 3, image 35 and series 6, image 37. This is at the level of the transverse colonic staple line. This may be contiguous with a component extending into the left upper quadrant series 3, image 29. Ill-defined air fluid collection in the left pericolic gutter measures approximately 7.4 x 4.3 x 8.4 cm, series 3, image 49 and series 6, image 85. There are multiple scattered small fluid collections and ill-defined fluid as well as mottled air within the right upper quadrant abdominal fat. Vascular/Lymphatic: Normal caliber abdominal aorta with mild atherosclerosis. Large presumed lymph node adjacent to the celiac axis measures 3.7 cm series 3, image 23. There are multifocal prominent and mildly enlarged retroperitoneal nodes, including 13 mm aortocaval node series 3, image 39. Prominent and mildly enlarged mesenteric, periportal and upper abdominal nodes measuring up to 14 mm series 3, image 32 adjacent to the fluid collection. Reproductive: No prostatic mass. Other: Recent postsurgical change with open midline abdominal wound. Scattered foci of free air deep to the abdominal wound as well as patchy air within the right upper abdominal fat. Abdominopelvic fluid collections as described above. Scattered areas of non organized free fluid in the upper abdomen and pelvis. Generalized body  [place] edema consistent with anasarca. Musculoskeletal: There are no acute or suspicious osseous abnormalities. IMPRESSION: 1. Interval transverse colostomy. Large ill-defined air fluid collection just distal to the surgical drain in the left upper quadrant adjacent to the transverse colonic suture line measuring up to 14.9 cm, suspicious for abscess. There are multiple additional smaller air-fluid collections in the abdomen as described. Areas of non organized free fluid are also seen which are indeterminate. 2. Subcapsular fluid collection about the spleen inferiorly containing air measuring 5.1 x 3.7 x 3.1 cm, suspicious for abscess. 3. Bilateral lower lobe airspace disease, left greater than right, greater than typically seen with atelectasis and suspicious for pneumonia. Small left pleural effusion. 4. Recent postsurgical change with open midline abdominal wound. Scattered foci of free air deep to the abdominal wound as well as patchy air within the right upper quadrant abdominal fat, likely postsurgical. 5. Cholelithiasis without CT findings of acute cholecystitis. 6. Abdominal adenopathy with a dominant lymph node adjacent to the celiac axis. Prominent and mildly enlarged retroperitoneal, mesenteric, periportal, and upper abdominal nodes, nonspecific. 7. Generalized body Heeter edema consistent  with anasarca. Aortic Atherosclerosis (ICD10-I70.0). These results will be called to the ordering clinician or representative by the Radiologist Assistant, and communication documented in the PACS or [employer]. Electronically Signed   By: Keith Rake M.D.   On: 02/15/2022 18:16   ECHOCARDIOGRAM COMPLETE  Result Date: 02/15/2022    ECHOCARDIOGRAM REPORT   Patient Name:   ELHADJI PECORE Piggee Date of Exam: 02/15/2022 Medical Rec #:  [id number]    Height:       72.0 in Accession #:    [id number]   Weight:       247.8 lb Date of Birth:  1970/12/07   BSA:          2.334 m Patient Age:    61 years     BP:           106/65  mmHg Patient Gender: M            HR:           104 bpm. Exam Location:  Inpatient Procedure: 2D Echo, Color Doppler and Cardiac Doppler Indications:    Fluid overload [[id number]]  History:        Patient has no prior history of Echocardiogram examinations.                 CAD, Prior CABG; Risk Factors:Diabetes.  Sonographer:    Greer Pickerel Referring Phys: [id number] Saverio Danker  Sonographer Comments: Patient is obese. Image acquisition challenging due to respiratory motion. IMPRESSIONS  1. Left ventricular ejection fraction, by estimation, is 50%. The left ventricle has mildly decreased function. The left ventricle demonstrates global hypokinesis with septal bounce suggesting prior cardiac surgery. Left ventricular diastolic parameters  are consistent with Grade II diastolic dysfunction (pseudonormalization).  2. Peak RV-RA gradient 14 mmHg. Right ventricular systolic function is normal. The right ventricular size is normal.  3. Left atrial size was moderately dilated.  4. Right atrial size was mildly dilated.  5. The mitral valve is normal in structure. No evidence of mitral valve regurgitation. No evidence of mitral stenosis.  6. The aortic valve is tricuspid. Aortic valve regurgitation is not visualized. No aortic stenosis is present.  7. Aortic dilatation noted. There is mild dilatation of the ascending aorta, measuring 40 mm.  8. Technically difficult study with poor acoustic windows. FINDINGS  Left Ventricle: Left ventricular ejection fraction, by estimation, is 50%. The left ventricle has mildly decreased function. The left ventricle demonstrates global hypokinesis. The left ventricular internal cavity size was normal in size. There is no left ventricular hypertrophy. Left ventricular diastolic parameters are consistent with Grade II diastolic dysfunction (pseudonormalization). Right Ventricle: Peak RV-RA gradient 14 mmHg. The right ventricular size is normal. No increase in right ventricular Gabrys thickness. Right  ventricular systolic function is normal. Left Atrium: Left atrial size was moderately dilated. Right Atrium: Right atrial size was mildly dilated. Pericardium: There is no evidence of pericardial effusion. Mitral Valve: The mitral valve is normal in structure. No evidence of mitral valve regurgitation. No evidence of mitral valve stenosis. Tricuspid Valve: The tricuspid valve is normal in structure. Tricuspid valve regurgitation is trivial. Aortic Valve: The aortic valve is tricuspid. Aortic valve regurgitation is not visualized. No aortic stenosis is present. Pulmonic Valve: The pulmonic valve was normal in structure. Pulmonic valve regurgitation is not visualized. Aorta: The aortic root is normal in size and structure and aortic dilatation noted. There is mild dilatation of the ascending aorta, measuring 40 mm. Venous: The inferior vena cava  was not well visualized. IAS/Shunts: No atrial level shunt detected by color flow Doppler.  LEFT VENTRICLE PLAX 2D LVIDd:         5.30 cm   Diastology LVIDs:         3.70 cm   LV e' medial:    10.90 cm/s LV PW:         1.10 cm   LV E/e' medial:  11.3 LV IVS:        0.80 cm   LV e' lateral:   16.60 cm/s LVOT diam:     2.40 cm   LV E/e' lateral: 7.4 LV SV:         86 LV SV Index:   37 LVOT Area:     4.52 cm  RIGHT VENTRICLE RV S prime:     12.30 cm/s TAPSE (M-mode): 2.2 cm LEFT ATRIUM            Index        RIGHT ATRIUM           Index LA diam:      4.90 cm  2.10 cm/m   RA Area:     19.40 cm LA Vol (A2C): 47.4 ml  20.31 ml/m  RA Volume:   48.70 ml  20.87 ml/m LA Vol (A4C): 100.0 ml 42.85 ml/m  AORTIC VALVE             PULMONIC VALVE LVOT Vmax:   120.00 cm/s PR End Diast Vel: 1.83 msec LVOT Vmean:  80.400 cm/s LVOT VTI:    0.191 m  AORTA Ao Root diam: 3.80 cm Ao Asc diam:  4.00 cm MITRAL VALVE                TRICUSPID VALVE MV Area (PHT): 2.33 cm     TR Peak grad:   14.6 mmHg MV Decel Time: 325 msec     TR Vmax:        191.00 cm/s MV E velocity: 123.00 cm/s MV A velocity:  43.50 cm/s   SHUNTS MV E/A ratio:  2.83         Systemic VTI:  0.19 m                             Systemic Diam: 2.40 cm Dalton McleanMD Electronically signed by Franki Monte Signature Date/Time: 02/15/2022/5:47:20 PM    Final     PMH:   Past Medical History:  Diagnosis Date   Anxiety    Arthritis    CAD (coronary artery disease)    s/p quad CABG   Chronic osteomyelitis of left foot ([place]) 12/20/2018   Chronic osteomyelitis of right foot ([place]) 12/20/2018   Diabetes mellitus without complication ([place])    Diabetic foot infection ([place]) 12/20/2018   Diabetic neuropathy ([place]) 12/20/2018   History of kidney stones    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   Morbid obesity ([place]) 12/20/2018   MSSA (methicillin susceptible Staphylococcus aureus) infection 12/20/2018   PONV (postoperative nausea and vomiting)     PSH:   Past Surgical History:  Procedure Laterality Date   AMPUTATION Right 05/10/2018   Procedure: PARTIAL 5TH RAY AMPUTATION RIGHT FOOT;  Surgeon: Caprice Beaver, DPM;  Location: [facility];  Service: Podiatry;  Laterality: Right;   AMPUTATION Left 10/26/2018   Procedure: PARTIAL 1ST RAY AMPUTATION FOOT;  Surgeon: Caprice Beaver, DPM;  Location: [facility];  Service: Podiatry;  Laterality:  Left;   APPLICATION OF WOUND VAC Right 05/10/2018   Procedure: APPLICATION OF WOUND VAC RIGHT FOOT;  Surgeon: Caprice Beaver, DPM;  Location: [facility];  Service: Podiatry;  Laterality: Right;   APPLICATION OF WOUND VAC  01/21/2022   Procedure: APPLICATION OF WOUND VAC;  Surgeon: Erroll Luna, MD;  Location: [facility];  Service: General;;   COLECTOMY WITH COLOSTOMY CREATION/HARTMANN PROCEDURE N/A 02/08/2022   ex-lap, resection of transverse colon with long Hartman's pouch, drainage of mesenteric abscess, resection of proximal jejunum with anastomosis - Dr. Brantley Stage   CORONARY ARTERY BYPASS GRAFT  04/01/2020   CABG x 5 (LIMA-LAD, RCA-PDA, VG-Diag, VG-OM, SVG-RPL) and LAA Clip at  [facility] in [place] with Sande Brothers, MD   CORONARY ARTERY BYPASS GRAFT     x4   LAPAROTOMY N/A 02/15/2022   Procedure: EXPLORATORY LAPAROTOMY WITH BOWEL RESECTIOM;  Surgeon: Erroll Luna, MD;  Location: [facility];  Service: General;  Laterality: N/A;   VASECTOMY     WOUND DEBRIDEMENT Right 05/10/2018   Procedure: DEBRIDEMENT PUNCTURE WOUND;  Surgeon: Caprice Beaver, DPM;  Location: [facility];  Service: Podiatry;  Laterality: Right;   WOUND DEBRIDEMENT Left 05/10/2018   Procedure: DEBRIDEMENT ULCERATION LEFT FOOT;  Surgeon: Caprice Beaver, DPM;  Location: [facility];  Service: Podiatry;  Laterality: Left;   WOUND DEBRIDEMENT Left 10/26/2018   Procedure: DEBRIDEMENT ULCER LEFT FOOT;  Surgeon: Caprice Beaver, DPM;  Location: [facility];  Service: Podiatry;  Laterality: Left;    Allergies:  Allergies  Allergen Reactions   Morphine     hallucinations    Medications:   Prior to Admission medications   Medication Sig Start Date End Date Taking? Authorizing Provider  APPLE CIDER VINEGAR PO Take 450 mg by mouth daily.   Yes [provider]  aspirin EC (BAYER ASPIRIN EC LOW DOSE) 81 MG tablet Take 81 mg by mouth daily.   Yes [provider]  atorvastatin (LIPITOR) 20 MG tablet Take 20 mg by mouth every evening.   Yes [provider]  escitalopram (LEXAPRO) 20 MG tablet Take 20 mg by mouth daily. 01/04/22  Yes [provider]  folic acid (FOLVITE) 1 MG tablet Take 1 tablet by mouth daily. 12/04/21  Yes [provider]  glipiZIDE (GLUCOTROL) 5 MG tablet Take 5 mg by mouth 2 (two) times daily before a meal. 05/22/20  Yes [provider]  omeprazole (PRILOSEC) 20 MG capsule Take 20 mg by mouth daily. 01/14/22  Yes [provider]    Discontinued Meds:   Medications Discontinued During This Encounter  Medication Reason   JARDIANCE 25 MG TABS tablet Discontinued by provider   HYDROcodone-acetaminophen ([place]) 7.5-325 MG tablet  Completed Course   Polyethyl Glycol-Propyl Glycol (SYSTANE OP) No longer needed (for PRN medications)   sertraline (ZOLOFT) 100 MG tablet Discontinued by provider   Multiple Vitamins-Minerals (MULTIVITAMIN WITH MINERALS) tablet Patient Preference   metFORMIN (GLUCOPHAGE-XR) 500 MG 24 hr tablet Change in therapy   glipiZIDE (GLUCOTROL XL) 5 MG 24 hr tablet Change in therapy   gabapentin (NEURONTIN) capsule 300 mg    oxyCODONE (Oxy IR/ROXICODONE) immediate release tablet 5 mg    oxyCODONE (Oxy IR/ROXICODONE) immediate release tablet 10 mg    enoxaparin (LOVENOX) injection 40 mg    acetaminophen (TYLENOL) tablet 650 mg    docusate sodium (COLACE) capsule 100 mg    lactated ringers infusion    acetaminophen (TYLENOL) tablet 650 mg    enoxaparin (LOVENOX) injection 40 mg    simethicone (  MYLICON) chewable tablet 80 mg    meperidine (DEMEROL) injection 6.25-12.5 mg Patient Transfer   promethazine (PHENERGAN) injection 6.25-12.5 mg Patient Transfer   HYDROmorphone (DILAUDID) injection 0.25-0.5 mg Patient Transfer   amisulpride (BARHEMSYS) injection 10 mg Patient Transfer   acetaminophen (OFIRMEV) IV 1,000 mg Patient Transfer   HYDROmorphone (DILAUDID) injection 1 mg    lactated ringers bolus 1,000 mL    lactated ringers infusion    enoxaparin (LOVENOX) injection 40 mg    piperacillin-tazobactam (ZOSYN) IVPB 3.375 g    lactated ringers infusion Discontinued by provider   potassium chloride 10 mEq in 100 mL IVPB    enoxaparin (LOVENOX) injection 60 mg    methocarbamol (ROBAXIN) 500 mg in dextrose 5 % 50 mL IVPB    enoxaparin (LOVENOX) injection 60 mg    heparin injection 5,000 Units     Social History:  reports that he has never smoked. He quit smokeless tobacco use about 32 years ago.  His smokeless tobacco use included chew. He reports that he does not currently use alcohol. He reports that he does not use drugs.  Family History:  No family history on file.  Blood pressure (!) 106/59,  pulse (!) 104, temperature 98.2 F (36.8 C), temperature source Axillary, resp. rate (!) 22, height 6' (1.829 m), weight 112.4 kg, SpO2 95 %. General appearance: alert, cooperative, and appears stated age Head: Normocephalic, without obvious abnormality, atraumatic Eyes: negative Back: symmetric, no curvature. ROM normal. No CVA tenderness. Resp: clear to auscultation bilaterally Cardio: regular rate and rhythm GI: no BS, drains in place Extremities: edema 1+, b/l ray amputations Pulses: 2+ and symmetric       Kynzley Dowson, Hunt Oris, MD 02/17/2022, 2:15 PM

## 2022-02-17 NOTE — Consult Note (Signed)
Reason for Consult/Chief Complaint: volume overload Consultant: Marlou Starks, MD  Patrick [name] is an 52 y.o. male.   HPI: 21M with retroperitoneal mass with invasion into the transverse colon, proximal small bowel with ischemic necrotic proximal jejunum with perforation fecal contamination with abscess, POD7 s/p exploratory laparotomy with resection of transverse colon, drainage of mesenteric abscess, resection of proximal jejunum with small bowel anastomosis and 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. Post-operatively, has not yet had return of bowel function and has increasing oxygen requirements.    Past Medical History:  Diagnosis Date   Anxiety    Arthritis    CAD (coronary artery disease)    s/p quad CABG   Chronic osteomyelitis of left foot ([place]) 12/20/2018   Chronic osteomyelitis of right foot ([place]) 12/20/2018   Diabetes mellitus without complication ([place])    Diabetic foot infection ([place]) 12/20/2018   Diabetic neuropathy ([place]) 12/20/2018   History of kidney stones    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   Morbid obesity ([place]) 12/20/2018   MSSA (methicillin susceptible Staphylococcus aureus) infection 12/20/2018   PONV (postoperative nausea and vomiting)     Past Surgical History:  Procedure Laterality Date   AMPUTATION Right 05/10/2018   Procedure: PARTIAL 5TH RAY AMPUTATION RIGHT FOOT;  Surgeon: Caprice Beaver, DPM;  Location: [facility];  Service: Podiatry;  Laterality: Right;   AMPUTATION Left 10/26/2018   Procedure: PARTIAL 1ST RAY AMPUTATION FOOT;  Surgeon: Caprice Beaver, DPM;  Location: [facility];  Service: Podiatry;  Laterality: Left;   APPLICATION OF WOUND VAC Right 05/10/2018   Procedure: APPLICATION OF WOUND VAC RIGHT FOOT;  Surgeon: Caprice Beaver, DPM;  Location: [facility];  Service: Podiatry;  Laterality: Right;   APPLICATION OF WOUND VAC  01/28/2022   Procedure: APPLICATION OF WOUND VAC;  Surgeon: Erroll Luna, MD;  Location:  [facility];  Service: General;;   COLECTOMY WITH COLOSTOMY CREATION/HARTMANN PROCEDURE N/A 01/28/2022   ex-lap, resection of transverse colon with long Hartman's pouch, drainage of mesenteric abscess, resection of proximal jejunum with anastomosis - Dr. Brantley Stage   CORONARY ARTERY BYPASS GRAFT  04/01/2020   CABG x 5 (LIMA-LAD, RCA-PDA, VG-Diag, VG-OM, SVG-RPL) and LAA Clip at [facility] in [place] with Sande Brothers, MD   CORONARY ARTERY BYPASS GRAFT     x4   LAPAROTOMY N/A 02/11/2022   Procedure: EXPLORATORY LAPAROTOMY WITH BOWEL RESECTIOM;  Surgeon: Erroll Luna, MD;  Location: [facility];  Service: General;  Laterality: N/A;   VASECTOMY     WOUND DEBRIDEMENT Right 05/10/2018   Procedure: DEBRIDEMENT PUNCTURE WOUND;  Surgeon: Caprice Beaver, DPM;  Location: [facility];  Service: Podiatry;  Laterality: Right;   WOUND DEBRIDEMENT Left 05/10/2018   Procedure: DEBRIDEMENT ULCERATION LEFT FOOT;  Surgeon: Caprice Beaver, DPM;  Location: [facility];  Service: Podiatry;  Laterality: Left;   WOUND DEBRIDEMENT Left 10/26/2018   Procedure: DEBRIDEMENT ULCER LEFT FOOT;  Surgeon: Caprice Beaver, DPM;  Location: [facility];  Service: Podiatry;  Laterality: Left;    No family history on file.  Social History:  reports that he has never smoked. He quit smokeless tobacco use about 32 years ago.  His smokeless tobacco use included chew. He reports that he does not currently use alcohol. He reports that he does not use drugs.  Allergies:  Allergies  Allergen Reactions   Morphine     hallucinations    Medications: I have reviewed the patient's current medications.  Results for orders placed or  performed during the hospital encounter of 02/17/2022 (from the past 48 hour(s))  Glucose, capillary     Status: Abnormal   Collection Time: 02/15/22  5:28 PM  Result Value Ref Range   Glucose-Capillary 214 (H) 70 - 99 mg/dL    Comment: Glucose reference range applies only to samples taken after fasting for at least 8  hours.  Glucose, capillary     Status: Abnormal   Collection Time: 02/15/22  8:07 PM  Result Value Ref Range   Glucose-Capillary 186 (H) 70 - 99 mg/dL    Comment: Glucose reference range applies only to samples taken after fasting for at least 8 hours.  Glucose, capillary     Status: Abnormal   Collection Time: 02/15/22 11:56 PM  Result Value Ref Range   Glucose-Capillary 205 (H) 70 - 99 mg/dL    Comment: Glucose reference range applies only to samples taken after fasting for at least 8 hours.  Glucose, capillary     Status: Abnormal   Collection Time: 02/16/22  3:57 AM  Result Value Ref Range   Glucose-Capillary 216 (H) 70 - 99 mg/dL    Comment: Glucose reference range applies only to samples taken after fasting for at least 8 hours.  CBC     Status: Abnormal   Collection Time: 02/16/22  5:20 AM  Result Value Ref Range   WBC 16.3 (H) 4.0 - 10.5 K/uL   RBC 3.15 (L) 4.22 - 5.81 MIL/uL   Hemoglobin 7.4 (L) 13.0 - 17.0 g/dL   HCT 24.8 (L) 39.0 - 52.0 %   MCV 78.7 (L) 80.0 - 100.0 fL   MCH 23.5 (L) 26.0 - 34.0 pg   MCHC 29.8 (L) 30.0 - 36.0 g/dL   RDW 17.1 (H) 11.5 - 15.5 %   Platelets 389 150 - 400 K/uL   nRBC 0.1 0.0 - 0.2 %    Comment: Performed at [facility], [place] [address] [place], [place] [address]  Basic metabolic panel     Status: Abnormal   Collection Time: 02/16/22  5:20 AM  Result Value Ref Range   Sodium 134 (L) 135 - 145 mmol/L   Potassium 4.2 3.5 - 5.1 mmol/L   Chloride 101 98 - 111 mmol/L   CO2 26 22 - 32 mmol/L   Glucose, Bld 209 (H) 70 - 99 mg/dL    Comment: Glucose reference range applies only to samples taken after fasting for at least 8 hours.   BUN 36 (H) 6 - 20 mg/dL   Creatinine, Ser 2.02 (H) 0.61 - 1.24 mg/dL   Calcium 7.3 (L) 8.9 - 10.3 mg/dL   GFR, Estimated 39 (L) >60 mL/min    Comment: (NOTE) Calculated using the CKD-EPI Creatinine Equation (2021)    Anion gap 7 5 - 15    Comment: Performed at [facility] [address]  [place], [place] [address]  Hepatic function panel     Status: Abnormal   Collection Time: 02/16/22  6:31 AM  Result Value Ref Range   Total Protein 4.6 (L) 6.5 - 8.1 g/dL   Albumin 1.5 (L) 3.5 - 5.0 g/dL   AST 50 (H) 15 - 41 U/L   ALT 20 0 - 44 U/L   Alkaline Phosphatase 78 38 - 126 U/L   Total Bilirubin 6.8 (H) 0.3 - 1.2 mg/dL   Bilirubin, Direct 4.7 (H) 0.0 - 0.2 mg/dL   Indirect Bilirubin 2.1 (H) 0.3 - 0.9 mg/dL    Comment:  Performed at [facility], [place] [address] [place], [place] [address]  Protime-INR     Status: Abnormal   Collection Time: 02/16/22  6:31 AM  Result Value Ref Range   Prothrombin Time 16.7 (H) 11.4 - 15.2 seconds   INR 1.4 (H) 0.8 - 1.2    Comment: (NOTE) INR goal varies based on device and disease states. Performed at [facility], [place] [name] [place], [place] [address]   Glucose, capillary     Status: Abnormal   Collection Time: 02/16/22  8:12 AM  Result Value Ref Range   Glucose-Capillary 208 (H) 70 - 99 mg/dL    Comment: Glucose reference range applies only to samples taken after fasting for at least 8 hours.  Glucose, capillary     Status: Abnormal   Collection Time: 02/16/22 10:53 AM  Result Value Ref Range   Glucose-Capillary 188 (H) 70 - 99 mg/dL    Comment: Glucose reference range applies only to samples taken after fasting for at least 8 hours.  Glucose, capillary     Status: Abnormal   Collection Time: 02/16/22  3:08 PM  Result Value Ref Range   Glucose-Capillary 168 (H) 70 - 99 mg/dL    Comment: Glucose reference range applies only to samples taken after fasting for at least 8 hours.  Glucose, capillary     Status: Abnormal   Collection Time: 02/16/22  8:10 PM  Result Value Ref Range   Glucose-Capillary 171 (H) 70 - 99 mg/dL    Comment: Glucose reference range applies only to samples taken after fasting for at least 8 hours.  Glucose, capillary     Status: Abnormal   Collection Time: 02/16/22 11:54 PM  Result Value Ref Range    Glucose-Capillary 166 (H) 70 - 99 mg/dL    Comment: Glucose reference range applies only to samples taken after fasting for at least 8 hours.  Glucose, capillary     Status: Abnormal   Collection Time: 02/17/22  4:01 AM  Result Value Ref Range   Glucose-Capillary 187 (H) 70 - 99 mg/dL    Comment: Glucose reference range applies only to samples taken after fasting for at least 8 hours.  CBC     Status: Abnormal   Collection Time: 02/17/22  5:42 AM  Result Value Ref Range   WBC 16.9 (H) 4.0 - 10.5 K/uL   RBC 2.73 (L) 4.22 - 5.81 MIL/uL   Hemoglobin 6.7 (LL) 13.0 - 17.0 g/dL    Comment: REPEATED TO VERIFY THIS CRITICAL RESULT HAS VERIFIED AND BEEN CALLED TO Cyndi Lennert RN BY FARZANEH AMIREHSANI ON 01 31 2024 AT 0612, AND HAS BEEN READ BACK.     HCT 21.4 (L) 39.0 - 52.0 %   MCV 78.4 (L) 80.0 - 100.0 fL   MCH 24.5 (L) 26.0 - 34.0 pg   MCHC 31.3 30.0 - 36.0 g/dL   RDW 17.3 (H) 11.5 - 15.5 %   Platelets 358 150 - 400 K/uL   nRBC 0.0 0.0 - 0.2 %    Comment: Performed at [facility] [address], [place],  [address]  Comprehensive metabolic panel     Status: Abnormal   Collection Time: 02/17/22  5:42 AM  Result Value Ref Range   Sodium 136 135 - 145 mmol/L   Potassium 4.2 3.5 - 5.1 mmol/L   Chloride 101 98 - 111 mmol/L   CO2 22 22 - 32 mmol/L   Glucose, Bld 192 (H)  70 - 99 mg/dL    Comment: Glucose reference range applies only to samples taken after fasting for at least 8 hours.   BUN 41 (H) 6 - 20 mg/dL   Creatinine, Ser 2.25 (H) 0.61 - 1.24 mg/dL   Calcium 7.5 (L) 8.9 - 10.3 mg/dL   Total Protein 5.0 (L) 6.5 - 8.1 g/dL   Albumin 2.1 (L) 3.5 - 5.0 g/dL   AST 41 15 - 41 U/L   ALT 19 0 - 44 U/L   Alkaline Phosphatase 67 38 - 126 U/L   Total Bilirubin 6.4 (H) 0.3 - 1.2 mg/dL   GFR, Estimated 35 (L) >60 mL/min    Comment: (NOTE) Calculated using the CKD-EPI Creatinine Equation (2021)    Anion gap 13 5 - 15    Comment: Performed at [facility]  [address], [place], [place] [address]  Magnesium     Status: None   Collection Time: 02/17/22  5:42 AM  Result Value Ref Range   Magnesium 2.1 1.7 - 2.4 mg/dL    Comment: Performed at [facility] [address], [place], [place] [address]  Phosphorus     Status: None   Collection Time: 02/17/22  5:42 AM  Result Value Ref Range   Phosphorus 3.7 2.5 - 4.6 mg/dL    Comment: Performed at [facility] [address], [place], [place] [address]  Hepatitis panel, acute     Status: None   Collection Time: 02/17/22  5:42 AM  Result Value Ref Range   Hepatitis B Surface Ag NON REACTIVE NON REACTIVE   HCV Ab NON REACTIVE NON REACTIVE    Comment: (NOTE) Nonreactive HCV antibody screen is consistent with no HCV infections,  unless recent infection is suspected or other evidence exists to indicate HCV infection.     Hep A IgM NON REACTIVE NON REACTIVE   Hep B C IgM NON REACTIVE NON REACTIVE    Comment: Performed at [facility], [place] [address] [place], [place] [address]  Brain natriuretic peptide     Status: Abnormal   Collection Time: 02/17/22  5:42 AM  Result Value Ref Range   B Natriuretic Peptide 215.8 (H) 0.0 - 100.0 pg/mL    Comment: Performed at [facility] [address] [place], [place] [address]  Prepare RBC (crossmatch)     Status: None   Collection Time: 02/17/22  7:13 AM  Result Value Ref Range   Order Confirmation      ORDER PROCESSED BY BLOOD BANK Performed at [facility], [address], [place], [place] [address]   Type and screen [place]     Status: None (Preliminary result)   Collection Time: 02/17/22  7:13 AM  Result Value Ref Range   ABO/RH(D) O POS    Antibody Screen NEG    Sample Expiration 02/20/2022,2359    Unit Number [id number]    Blood Component Type RED CELLS,LR    Unit division 00    Status of Unit ISSUED    Transfusion Status OK TO TRANSFUSE    Crossmatch Result Compatible    Unit Number  [id number]    Blood Component Type RED CELLS,LR    Unit division 00    Status of Unit ISSUED    Transfusion Status OK TO TRANSFUSE    Crossmatch Result      Compatible Performed at [facility], [place] [address], [place], [place]  [address]   Glucose, capillary     Status: Abnormal   Collection Time: 02/17/22  8:23 AM  Result Value Ref Range   Glucose-Capillary 163 (H) 70 - 99 mg/dL    Comment: Glucose reference range applies only to samples taken after fasting for at least 8 hours.  Glucose, capillary     Status: Abnormal   Collection Time: 02/17/22 11:13 AM  Result Value Ref Range   Glucose-Capillary 190 (H) 70 - 99 mg/dL    Comment: Glucose reference range applies only to samples taken after fasting for at least 8 hours.  I-STAT 7, (LYTES, BLD GAS, ICA, H+H)     Status: Abnormal   Collection Time: 02/17/22 11:51 AM  Result Value Ref Range   pH, Arterial 7.428 7.35 - 7.45   pCO2 arterial 39.3 32 - 48 mmHg   pO2, Arterial 62 (L) 83 - 108 mmHg   Bicarbonate 26.0 20.0 - 28.0 mmol/L   TCO2 27 22 - 32 mmol/L   O2 Saturation 92 %   Acid-Base Excess 2.0 0.0 - 2.0 mmol/L   Sodium 138 135 - 145 mmol/L   Potassium 4.3 3.5 - 5.1 mmol/L   Calcium, Ion 1.12 (L) 1.15 - 1.40 mmol/L   HCT 26.0 (L) 39.0 - 52.0 %   Hemoglobin 8.8 (L) 13.0 - 17.0 g/dL   Patient temperature 98.6 F    Collection site RADIAL, ALLEN'S TEST ACCEPTABLE    Drawn by RT    Sample type ARTERIAL   Creatinine, urine, random     Status: None   Collection Time: 02/17/22 12:27 PM  Result Value Ref Range   Creatinine, Urine 74 mg/dL    Comment: Performed at [facility] [address], [place], [place] [address]  Sodium, urine, random     Status: None   Collection Time: 02/17/22 12:27 PM  Result Value Ref Range   Sodium, Ur 29 mmol/L    Comment: Performed at [facility] [address], [place], [place] [address]  Glucose, capillary     Status: Abnormal   Collection Time: 02/17/22  3:27 PM   Result Value Ref Range   Glucose-Capillary 189 (H) 70 - 99 mg/dL    Comment: Glucose reference range applies only to samples taken after fasting for at least 8 hours.    DG CHEST PORT 1 VIEW  Result Date: 02/17/2022 CLINICAL DATA:  Hypoxia. EXAM: PORTABLE CHEST 1 VIEW COMPARISON:  February 15, 2022. FINDINGS: Stable cardiomegaly. Status post coronary bypass graft. Stable probable right basilar subsegmental atelectasis. Stable left basilar atelectasis or infiltrate is noted with associated pleural effusion. Bony thorax is unremarkable. IMPRESSION: Stable bilateral lung opacities as noted above. Electronically Signed   By: Marijo Conception M.D.   On: 02/17/2022 11:57   CT GUIDED VISCERAL FLUID DRAIN BY PERC CATH  Result Date: 02/16/2022 INDICATION: 52 year old male abdominal abscess referred for drainage EXAM: CT GUIDED DRAINAGE OF  ABSCESS MEDICATIONS: The patient is currently admitted to the hospital and receiving intravenous antibiotics. The antibiotics were administered within an appropriate time frame prior to the initiation of the procedure. ANESTHESIA/SEDATION: 0.5 mg IV Versed 0 mcg IV Fentanyl Moderate Sedation Time:  0 minutes The patient was continuously monitored during the procedure by the interventional radiology nurse under my direct supervision. COMPLICATIONS: None TECHNIQUE: Informed written consent was obtained from the patient after a thorough discussion of the procedural risks, benefits and alternatives. All questions were addressed. Maximal Sterile Barrier Technique was utilized including  caps, mask, sterile gowns, sterile gloves, sterile drape, hand hygiene and skin antiseptic. A timeout was performed prior to the initiation of the procedure. PROCEDURE: The operative field was prepped with Chlorhexidine in a sterile fashion, and a sterile drape was applied covering the operative field. A sterile gown and sterile gloves were used for the procedure. Local anesthesia was provided with 1%  Lidocaine. Patient was positioned supine on the CT gantry table. Scout CT acquired for planning purposes. Patient is prepped and draped in the usual sterile fashion. 1% lidocaine was used for local anesthesia. Using CT guidance and modified Seldinger technique, a 12 French drain was placed into the fluid and gas collection of the left upper quadrant. Once the drain was in position, proximally 350 cc of frankly purulent material was evacuated. A gravity drain was attached given the presumed bowel injury/tumor. Drain was sutured in position. Patient tolerated the procedure well and remained hemodynamically stable throughout. No complications were encountered and no significant blood loss. FINDINGS: Planning CT demonstrates fluid and gas collection of the left upper quadrant. 350 cc of frankly purulent material aspirated. Final CT demonstrates adequate positioning of the drain with partial decompression of the cavity. IMPRESSION: Status post CT-guided drainage of left upper quadrant abscess. Signed, Dulcy Fanny. Nadene Rubins, RPVI [facility] Radiology Electronically Signed   By: Corrie Mckusick D.O.   On: 02/16/2022 12:47   CT ABDOMEN PELVIS WO CONTRAST  Result Date: 02/15/2022 CLINICAL DATA:  Abdominal pain, post-op s/p SBR and transverse colectomy for large retroperitoneal mass, fevers, leukocytosis. EXAM: CT ABDOMEN AND PELVIS WITHOUT CONTRAST TECHNIQUE: Multidetector CT imaging of the abdomen and pelvis was performed following the standard protocol without IV contrast. RADIATION DOSE REDUCTION: This exam was performed according to the departmental dose-optimization program which includes automated exposure control, adjustment of the mA and/or kV according to patient size and/or use of iterative reconstruction technique. COMPARISON:  CT 02/07/2022 FINDINGS: Lower chest: Bilateral lower lobe airspace disease, left greater than right. The left lower lobe pulmonary  parenchyma is likely heterogeneous. Small left pleural effusion. Decreased density of the blood pool suggests anemia. Hepatobiliary: No evidence of focal hepatic abnormality on this unenhanced exam. Layering gallstones in the gallbladder which is mildly distended. No biliary dilatation. Pancreas: No ductal dilatation or inflammation. Spleen: Subcapsular fluid collection, inferiorly containing air measures approximately 5.1 x 3.7 x 3.1 cm, series 3, image 35 and series 6, image 2018. The spleen is normal in size. Adrenals/Urinary Tract: Normal adrenal glands. No hydronephrosis. Mild bilateral perinephric edema. Minimally distended urinary bladder. Foley catheter is in place, balloon is likely in the bladder. Air in the bladder is consistent with Foley placement. Stomach/Bowel: Lack of enteric contrast limits detailed bowel assessment. Interval transverse colostomy. There is no small bowel obstruction or postoperative ileus. The appendix is tentatively visualized and normal. Surgical drain entering from the right paramidline courses into the left abdomen and terminates in the left upper quadrant anteriorly. Just distal to the drain is a large heterogeneous ill-defined air fluid collection. Exact size measurement is difficult due to the ill-defined nature, however representative measurements of 12.4 x 13.9 x 14.9 cm, series 3, image 35 and series 6, image 37. This is at the level of the transverse colonic staple line. This may be contiguous with a component extending into the left upper quadrant series 3, image 29. Ill-defined air fluid collection in the left pericolic gutter measures approximately 7.4 x 4.3 x 8.4 cm, series 3, image 49  and series 6, image 85. There are multiple scattered small fluid collections and ill-defined fluid as well as mottled air within the right upper quadrant abdominal fat. Vascular/Lymphatic: Normal caliber abdominal aorta with mild atherosclerosis. Large presumed lymph node adjacent to  the celiac axis measures 3.7 cm series 3, image 23. There are multifocal prominent and mildly enlarged retroperitoneal nodes, including 13 mm aortocaval node series 3, image 39. Prominent and mildly enlarged mesenteric, periportal and upper abdominal nodes measuring up to 14 mm series 3, image 32 adjacent to the fluid collection. Reproductive: No prostatic mass. Other: Recent postsurgical change with open midline abdominal wound. Scattered foci of free air deep to the abdominal wound as well as patchy air within the right upper abdominal fat. Abdominopelvic fluid collections as described above. Scattered areas of non organized free fluid in the upper abdomen and pelvis. Generalized body Schoenherr edema consistent with anasarca. Musculoskeletal: There are no acute or suspicious osseous abnormalities. IMPRESSION: 1. Interval transverse colostomy. Large ill-defined air fluid collection just distal to the surgical drain in the left upper quadrant adjacent to the transverse colonic suture line measuring up to 14.9 cm, suspicious for abscess. There are multiple additional smaller air-fluid collections in the abdomen as described. Areas of non organized free fluid are also seen which are indeterminate. 2. Subcapsular fluid collection about the spleen inferiorly containing air measuring 5.1 x 3.7 x 3.1 cm, suspicious for abscess. 3. Bilateral lower lobe airspace disease, left greater than right, greater than typically seen with atelectasis and suspicious for pneumonia. Small left pleural effusion. 4. Recent postsurgical change with open midline abdominal wound. Scattered foci of free air deep to the abdominal wound as well as patchy air within the right upper quadrant abdominal fat, likely postsurgical. 5. Cholelithiasis without CT findings of acute cholecystitis. 6. Abdominal adenopathy with a dominant lymph node adjacent to the celiac axis. Prominent and mildly enlarged retroperitoneal, mesenteric, periportal, and upper  abdominal nodes, nonspecific. 7. Generalized body Hilyard edema consistent with anasarca. Aortic Atherosclerosis (ICD10-I70.0). These results will be called to the ordering clinician or representative by the Radiologist Assistant, and communication documented in the PACS or [employer]. Electronically Signed   By: Keith Rake M.D.   On: 02/15/2022 18:16    ROS 10 point review of systems is negative except as listed above in HPI.   Physical Exam Blood pressure 105/63, pulse (!) 104, temperature 98.2 F (36.8 C), temperature source Axillary, resp. rate 11, height 6' (1.829 m), weight 112.4 kg, SpO2 97 %. Constitutional: well-developed, well-nourished HEENT: pupils equal, round, reactive to light, 42m b/l, moist conjunctiva, external inspection of ears and nose normal, hearing intact Oropharynx: normal oropharyngeal mucosa, normal dentition Neck: no thyromegaly, trachea midline Chest: breath sounds equal bilaterally, normal respiratory effort, no midline or lateral chest Ralston tenderness to palpation/deformity Abdomen: soft, NT, no bruising, no hepatosplenomegaly GU: no blood at urethral meatus of penis, no scrotal masses or abnormality, foley  Extremities: 2+ radial and pedal pulses bilaterally, intact motor and sensation bilateral UE and LE, 3+ peripheral edema, L TMA MSK: unable to assess gait/station, no clubbing/cyanosis of fingers/toes, unable to assess ROM of all four extremities Skin: warm, dry, no rashes Psych: normal memory    Assessment/Plan: 72M with retroperitoneal mass  RP mass - POD7 s/p exploratory laparotomy with resection of transverse colon, drainage of mesenteric abscess, resection of proximal jejunum with small bowel anastomosis and 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. AROBF, cont TPN, concentrate next bag of TPN Volume overload  and pulmonary edema - '60mg'$  lasix given by Renal, Dr. Augustin Coupe, aggressive diuresis with lasix gtt, Renal agrees, d/c albumin AKI -  suspect element of relative hypotension felt by the kidneys, levo prn to goal MAP of 70, titrate lasix gtt to allow no more than 10 of levo for BP, trend creatinine and check FeUrea in AM FEN - NPO, AROBF, concentrate next bag of TPN, limit volume admin DVT - SQH, encourage ambulation, given obesity and limited ambulation, recommend 0.5cc/kg/day of LMWH, monitor creatinine clearance carefully for dose adjustment and monitor drain output/hgb Dispo - ICU  Critical care time: 24mn  AJesusita Oka MD General and [facility]

## 2022-02-17 NOTE — Progress Notes (Signed)
Physical Therapy Treatment Patient Details Name: Patrick [name] MRN: [id number] DOB: September 06, 1970 Today's Date: 02/17/2022   History of Present Illness Pt is a 52 y/o male admitted 1/24 with progressive anorexia, weight loss and abdominal mass.  1/24 pt s/p exp. Lap. With resection of transverse colon, drainage of mesenteric abscess, resection of proximal jejunum with small bowel anastomosis and diverting right colostomy.  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    PT Comments    Pt progressing slowly, has the ability to do more (though he is fatigue).  Plan was to do so activity (ADL's) in the bathroom or ambulate with RW.  Pt transitioned to EOB, scooting, sat for 6-8 min then stood in the RW, and transferred to the chair in lieu of further activity.    Recommendations for follow up therapy are one component of a multi-disciplinary discharge planning process, led by the attending physician.  Recommendations may be updated based on patient status, additional functional criteria and insurance authorization.  Follow Up Recommendations  Home health PT     Assistance Recommended at Discharge Intermittent Supervision/Assistance  Patient can return home with the following A little help with walking and/or transfers;A little help with bathing/dressing/bathroom;Assistance with cooking/housework;Assist for transportation;Help with stairs or ramp for entrance   Equipment Recommendations  Other (comment)    Recommendations for Other Services       Precautions / Restrictions Precautions Precautions: Fall Precaution Comments: NG tube, JP Drain, watch O2 Restrictions Weight Bearing Restrictions: No     Mobility  Bed Mobility Overal bed mobility: Needs Assistance Bed Mobility: Supine to Sit     Supine to sit: Min assist, HOB elevated     General bed mobility comments: light min assist provided to transiton to  seated EOB    Transfers Overall transfer level: Needs assistance Equipment used: Rolling walker (2 wheels) Transfers: Sit to/from Stand, Bed to chair/wheelchair/BSC Sit to Stand: Min guard, +2 physical assistance (2nd person assisting although not physically necessary to provide support)   Step pivot transfers: Min guard            Ambulation/Gait                   Stairs             Wheelchair Mobility    Modified Rankin (Stroke Patients Only)       Balance Overall balance assessment: Needs assistance Sitting-balance support: No upper extremity supported, Feet supported Sitting balance-Leahy Scale: Fair     Standing balance support: Bilateral upper extremity supported, During functional activity Standing balance-Leahy Scale: Poor Standing balance comment: relied on RW for balance while standing                            Cognition Arousal/Alertness: Awake/alert Behavior During Therapy: Flat affect Overall Cognitive Status: Within Functional Limits for tasks assessed                                 General Comments: self limiting        Exercises      General Comments General comments (skin integrity, edema, etc.): vss,  Pt needing lots of encouragement to do a little bit of activity.      Pertinent Vitals/Pain Pain Assessment Pain Assessment: Faces Faces Pain Scale: Hurts little more Pain  Location: abdomen and back Pain Descriptors / Indicators: Grimacing Pain Intervention(s): Monitored during session    Home Living                          Prior Function            PT Goals (current goals can now be found in the care plan section) Acute Rehab PT Goals PT Goal Formulation: With patient Time For Goal Achievement: 02/25/22 Potential to Achieve Goals: Good Progress towards PT goals: Progressing toward goals (but is self-limiting or states he will do more on his own time.)    Frequency     Min 3X/week      PT Plan Current plan remains appropriate    Co-evaluation PT/OT/SLP Co-Evaluation/Treatment: Yes Reason for Co-Treatment: For patient/therapist safety PT goals addressed during session: Mobility/safety with mobility OT goals addressed during session: Strengthening/ROM;Proper use of Adaptive equipment and DME      AM-PAC PT "6 Clicks" Mobility   Outcome Measure  Help needed turning from your back to your side while in a flat bed without using bedrails?: A Little Help needed moving from lying on your back to sitting on the side of a flat bed without using bedrails?: A Lot Help needed moving to and from a bed to a chair (including a wheelchair)?: A Lot Help needed standing up from a chair using your arms (e.g., wheelchair or bedside chair)?: A Little Help needed to walk in hospital room?: A Little Help needed climbing 3-5 steps with a railing? : A Lot 6 Click Score: 15    End of Session   Activity Tolerance: Patient tolerated treatment well;Patient limited by fatigue Patient left: in chair;with call bell/phone within reach;with chair alarm set Nurse Communication: Mobility status PT Visit Diagnosis: Unsteadiness on feet (R26.81);Muscle weakness (generalized) (M62.81) Pain - part of body:  (abdominal incisions sore)     Time: 7078-6754 PT Time Calculation (min) (ACUTE ONLY): 16 min  Charges:  $Therapeutic Activity: 8-22 mins                     02/17/2022  Ginger Carne., PT Acute Rehabilitation Services [phone]  (office)   [name] Mirtha Jain 02/17/2022, 5:27 PM

## 2022-02-17 NOTE — Progress Notes (Signed)
PROGRESS NOTE  Patrick [name]  DOB: 11-Feb-1970  PCP: Caryl Bis, MD PNT:[id number]  DOA: 01/19/2022  LOS: 8 days  Hospital Day: 9  Brief narrative: Patrick [name] is a 52 y.o. male with PMH significant for DM2, HTN, HLD, CAD/CABG, diabetic neuropathy, chronic osteomyelitis of both feet.  Patient has had abdominal pain for 4 weeks which increased in severity for about a week after which she went to see his PCP.  CT scan of abdomen was obtained on 1/23 which showed extensive small bowel ischemia with pneumatosis in the left upper quadrant with surrounding pulmonary changes and probable abscess and pneumoperitoneum.  Patient was hence directed to the ED on 1/23.  In the ED, CT scan was repeated which showed marked severity colitis involving the distal transverse colon with a large abscess versus mass with markedly inflamed, likely ischemic partially obstructed proximal small bowel loops.  Also showed cholelithiasis, possible acute pyelonephritis. Patient was admitted to general surgery service.  1/29, hospital service was consulted for comanagement of medical issues. See below for details.   Subjective: Patient was seen and examined this morning.  Pleasant middle-aged Caucasian male.  Lying on bed.  On 5 L oxygen this morning.  Wife at bedside. Grossly jaundiced. And ostomy bag with little stool output. Has 1-2+ bilateral pedal edema TPN ongoing Chart reviewed In the last 24 hours, no fever, remains persistently tachycardic between 100 and 110 bpm. Blood pressure in 90s and low 100s, breathing on room air. Last set of labs from this morning showed WBC count 16.9, hemoglobin low at 6.7, BUN/creatinine elevated to 41/2.25  Assessment and plan: Retroperitoneal mass with invasion of the transverse colon with proximal small bowel with ischemic necrotic proximal jejunum with perforation with fecal contamination with abscess 1/24, patient underwent expiratory laparotomy.  He was found to have  a retroperitoneal mass with invasion of the transverse colon, proximal small bowel with ischemic necrotic proximal jejunum and perforation fecal contamination with abscess.  He underwent resection of the transverse colon, drainage of mesenteric abscess, resection of proximal jejunum with small bowel anastomosis and diverting right colostomy and long Hartmann pouch. Postsurgically, patient had postop ileus.  He was started on TPN 1/29, repeat CT abdomen showed a large intra-abdominal abscess adjacent to the transverse colonic suture line 1/30, patient underwent CT-guided drainage of left upper quadrant abscess by IR. Currently remaining postop ileus On TPN present for surgery.  Sepsis - not POA Postsurgically, patient had fever up to 101 with tachycardia, leukocytosis CT abdomen showed intra-abdominal abscesses.  It also showed possible bilateral pneumonia. No growth in blood cultures so far Currently on empiric coverage with IV Zosyn. No fever in the last 24 hours.  WBC count still remains elevated though Defer to general surgery for the duration of antibiotic treatment Recent Labs  Lab 02/13/22 0500 02/14/22 0445 02/15/22 0441 02/16/22 0520 02/17/22 0542  WBC 11.7* 16.6* 16.1* 16.3* 16.9*  PROCALCITON  --   --  2.93  --   --     Acute blood loss anemia Hemoglobin has been running low this admission 6.7 this morning.  Noted a plan for 2 units of PRBC transfusion today. Recent Labs    02/13/22 0500 02/14/22 0445 02/15/22 0441 02/16/22 0520 02/17/22 0542  HGB 7.3* 8.4* 7.8* 7.4* 6.7*  MCV 80.7 81.1 79.7* 78.7* 78.4*   Acute kidney injury Mild systolic CHF Creatinine normal at baseline.  Presented with creatinine up 1.57, fluctuated postoperatively.  Steadily rising creatinine in the last  48 hours.  2.25 today.  Blood pressures running low normal.  Heart rate elevated.   Echocardiogram 1/29 showed LVEF 50% with global hypokinesis, grade 2 diastolic dysfunction Mild systolic  dysfunction likely due to acute illness. IV Lasix on hold currently Probably has low effective arterial volume due to third spacing Intermittently given IV albumin Currently on TPN Discussed with surgery.  Surgery wants to involve which I agree with.  Patient may need dopamine drip and diuresis. Recent Labs    02/05/2022 1710 02/14/2022 0002 01/28/2022 0535 02/11/22 0432 02/12/22 0240 02/13/22 0500 02/13/22 0856 02/14/22 0445 02/15/22 0441 02/16/22 0520 02/17/22 0542  BUN 19  --  '19 19 20 14 15 16 '$ 24* 36* 41*  CREATININE 1.57* 1.49* 1.40* 1.28* 1.06 0.92 1.05 1.18 1.51* 2.02* 2.25*   Acute respiratory with hypoxia Worsening oxygen requirement.  On 5 L this morning. As discussed above, worsening oxygen requirement is multifactorial- secondary to combination of atelectasis as well as bilateral pneumonia, worsening anemia and mild systolic CHF. Wean down as tolerated.  Hyperbilirubinemia Cholelithiasis LFTs trend as below.  Noted rising both total and direct bilirubin without rising AST, ALT or alk phos.  Unclear etiology. CT abdomen showed cholelithiasis without choledocholithiasis or CBD dilatation. Continue to monitor Recent Labs  Lab 02/13/22 0500 02/14/22 0445 02/15/22 0441 02/16/22 0520 02/16/22 0631 02/17/22 0542  AST 20  --  34  33  --  50* 41  ALT 11  --  18  16  --  20 19  ALKPHOS 49  --  75  75  --  78 67  BILITOT 3.4*  --  5.9*  6.2*  --  6.8* 6.4*  BILIDIR  --   --  4.1*  --  4.7*  --   IBILI  --   --  1.8*  --  2.1*  --   PROT 4.0*  --  4.4*  4.5*  --  4.6* 5.0*  ALBUMIN 1.7*  --  1.6*  1.6*  --  1.5* 2.1*  INR  --   --   --   --  1.4*  --   PLT 424* 459* 378 389  --  358   Type 2 diabetes mellitus A1c 6.6 on 02/04/2022 PTA on glipizide 5 mg twice daily Currently on sliding scale insulin only Recent Labs  Lab 02/16/22 2010 02/16/22 2354 02/17/22 0401 02/17/22 0823 02/17/22 1113  GLUCAP 171* 166* 187* 163* 190*   Acute hypoxic respiratory  failure Secondary to atelectasis.   Continue supplemental oxygen  Encourage use of incentive spirometry and OOB   CAD/CABG PTA on aspirin 81 mg daily, Lipitor 20 mg daily Currently on hold  Chronic osteomyelitis of both feet S/p ray amputations 2020  Anxiety/depression PTA on Lexapro 20 mg daily, Currently on hold   Mobility: Needs PT evaluation once clinically improved.  Goals of care   Code Status: Full Code    DVT prophylaxis:  heparin injection 5,000 Units Start: 02/18/22 1400 SCDs Start: 02/08/2022 2308   Antimicrobials: IV Zosyn Fluid: TPN Family Communication: Wife at bedside  Continue in-hospital care because: Not medically stable for discharge yet.   Scheduled Meds:  alteplase  2 mg Intracatheter Once   Chlorhexidine Gluconate Cloth  6 each Topical Daily   [START ON 02/18/2022] heparin injection (subcutaneous)  5,000 Units Subcutaneous Q8H   insulin aspart  0-15 Units Subcutaneous Q4H   sodium chloride flush  10-40 mL Intracatheter Q12H   sodium chloride flush  5 mL Intracatheter  Q8H    PRN meds: acetaminophen, HYDROmorphone (DILAUDID) injection, methocarbamol, ondansetron (ZOFRAN) IV, mouth rinse, oxyCODONE, prochlorperazine, sodium chloride flush   Infusions:   sodium chloride Stopped (02/16/2022 1602)   albumin human 25 g (02/17/22 0958)   piperacillin-tazobactam (ZOSYN)  IV     TPN ADULT (ION) 100 mL/hr at 02/17/22 1000   TPN ADULT (ION)      Diet:  Diet Order             Diet clear liquid Room service appropriate? Yes; Fluid consistency: Thin  Diet effective now                   Antimicrobials: Anti-infectives (From admission, onward)    Start     Dose/Rate Route Frequency Ordered Stop   02/17/22 1200  piperacillin-tazobactam (ZOSYN) IVPB 3.375 g        3.375 g 12.5 mL/hr over 240 Minutes Intravenous Every 8 hours 02/17/22 1103     02/15/22 1500  piperacillin-tazobactam (ZOSYN) IVPB 3.375 g        3.375 g 12.5 mL/hr over 240 Minutes  Intravenous Every 8 hours 02/15/22 0836 02/17/22 0217   02/08/2022 1100  piperacillin-tazobactam (ZOSYN) IVPB 3.375 g        3.375 g 100 mL/hr over 30 Minutes Intravenous  Once 02/02/2022 1056 02/07/2022 1230   01/18/2022 0200  piperacillin-tazobactam (ZOSYN) IVPB 3.375 g  Status:  Discontinued        3.375 g 12.5 mL/hr over 240 Minutes Intravenous Every 8 hours 02/06/2022 2322 02/15/22 0836   01/29/2022 1900  piperacillin-tazobactam (ZOSYN) IVPB 3.375 g        3.375 g 100 mL/hr over 30 Minutes Intravenous  Once 02/13/2022 1857 02/14/2022 2012       Skin assessment:       Nutritional status:  Body mass index is 33.61 kg/m.  Nutrition Problem: Severe Malnutrition Etiology: acute illness (mass with perforation) Signs/Symptoms: moderate fat depletion, energy intake < or equal to 50% for > or equal to 5 days     Objective: Vitals:   02/17/22 0900 02/17/22 1000  BP: (!) 106/57 107/61  Pulse: (!) 112 (!) 108  Resp: 16 18  Temp:    SpO2: 92% 91%    Intake/Output Summary (Last 24 hours) at 02/17/2022 1123 Last data filed at 02/17/2022 1000 Gross per 24 hour  Intake 2618.73 ml  Output 2285 ml  Net 333.73 ml   Filed Weights   02/13/22 0500 02/14/22 0451 02/15/22 0500  Weight: 112.8 kg 112.9 kg 112.4 kg   Weight change:  Body mass index is 33.61 kg/m.   Physical Exam: General exam: Pleasant middle-aged Caucasian male.   Not in pain Skin: No rashes, lesions or ulcers. Grossly jaundiced.  HEENT: Atraumatic, normocephalic, no obvious bleeding Lungs: Diminished air entry in both bases.  On 5 L oxygen medical at this morning CVS: Regular rate and rhythm, no murmur GI/Abd soft, appropriate postop tenderness.  Bowel sound present.  Small amount of stool in the ostomy bag CNS: Alert, awake, oriented x 3 Psychiatry: Sad affect Extremities: 1-2+ bilateral pedal edema, no calf tenderness  Data Review: I have personally reviewed the laboratory data and studies available.  F/u labs  ordered Unresulted Labs (From admission, onward)     Start     Ordered   02/18/22 0500  CBC  Daily,   R     Question:  Specimen collection method  Answer:  Unit=Unit collect   02/17/22 1103   02/18/22  0500  Comprehensive metabolic panel  Daily,   R     Question:  Specimen collection method  Answer:  Unit=Unit collect   02/17/22 1103   02/17/22 1103  Blood gas, arterial  ONCE - STAT,   STAT        02/17/22 1102   02/17/22 0500  Hepatitis panel, acute  Tomorrow morning,   R       Question:  Specimen collection method  Answer:  Unit=Unit collect   02/16/22 1224   02/16/22 4765  Basic metabolic panel  Daily,   R     Question:  Specimen collection method  Answer:  Unit=Unit collect   02/15/22 1018   02/15/22 0500  Comprehensive metabolic panel  (TPN Lab Panel)  Every Mon,Thu (0500),   R     Question:  Specimen collection method  Answer:  Lab=Lab collect   02/12/22 1001   02/15/22 0500  Magnesium  (TPN Lab Panel)  Every Mon,Thu (0500),   R     Question:  Specimen collection method  Answer:  Lab=Lab collect   02/12/22 1001   02/15/22 0500  Phosphorus  (TPN Lab Panel)  Every Mon,Thu (0500),   R     Question:  Specimen collection method  Answer:  Lab=Lab collect   02/12/22 1001   02/15/22 0500  Triglycerides  (TPN Lab Panel)  Every Mon,Thu (0500),   R     Question:  Specimen collection method  Answer:  Lab=Lab collect   02/12/22 1001            Total time spent in review of labs and imaging, patient evaluation, formulation of plan, documentation and communication with family: 52 minutes  Signed, Terrilee Croak, MD [facility] 02/17/2022

## 2022-02-17 NOTE — Progress Notes (Signed)
Patient ID: Patrick [name], male   DOB: May 26, 1970, 52 y.o.   MRN: [id number] 7 Days Post-Op    Subjective: Passing gas in ostomy.  No nausea.  Tolerated some clears.  Doesn't feel well today, but can't pinpoint anything specific.  Got IR drain yesterday.  Increasing O2 needs, now on 5L [place] and may need to bump up due to sats between 89-92%.  Desats into low 80s or lower when O2 removed.  Family member at bedside, I believe his wife.   Objective: Vital signs in last 24 hours: Temp:  [98.7 F (37.1 C)-100 F (37.8 C)] 98.7 F (37.1 C) (01/31 0855) Pulse Rate:  [103-116] 108 (01/31 1000) Resp:  [14-24] 18 (01/31 1000) BP: (86-116)/(50-77) 107/61 (01/31 1000) SpO2:  [82 %-96 %] 91 % (01/31 1000) Last BM Date :  (pta)  Intake/Output from previous day: 01/30 0701 - 01/31 0700 In: 2823.9 [I.V.:2387.4; IV Piggyback:436.5] Out: 2485 [Urine:1725; Drains:735; Stool:25] Intake/Output this shift: Total I/O In: 300.7 [I.V.:291.1; IV Piggyback:9.5] Out: -   Gen: NAD, but appears quite fatigued Heart: regular, but tachy in 100-110s Lungs: decrease BS bilaterally, 5L [place], sats in low 90s, high 80s.  Minima cough effort  Abd: soft, wound with increase in tan drainage today.  Appears more like fibrinous exudate.  No overt enteric material in wound.  Colostomy is viable with some liquid output and air.  Surgical drain back to bright yellow serous fluid.  IR/LUQ drain with cloudy bloody output. GU: foley in place with orange colored urine with some sediment. Ext: BLE edema Skin: some jaundice and diffuse anasarca Psych: A&Ox3  Lab Results: CBC  Recent Labs    02/16/22 0520 02/17/22 0542  WBC 16.3* 16.9*  HGB 7.4* 6.7*  HCT 24.8* 21.4*  PLT 389 358   BMET Recent Labs    02/16/22 0520 02/17/22 0542  NA 134* 136  K 4.2 4.2  CL 101 101  CO2 26 22  GLUCOSE 209* 192*  BUN 36* 41*  CREATININE 2.02* 2.25*  CALCIUM 7.3* 7.5*   PT/INR Recent Labs    02/16/22 0631  LABPROT 16.7*  INR  1.4*   ABG No results for input(s): "PHART", "HCO3" in the last 72 hours.  Invalid input(s): "PCO2", "PO2"  Studies/Results: CT GUIDED VISCERAL FLUID DRAIN BY PERC CATH  Result Date: 02/16/2022 INDICATION: 52 year old male abdominal abscess referred for drainage EXAM: CT GUIDED DRAINAGE OF  ABSCESS MEDICATIONS: The patient is currently admitted to the hospital and receiving intravenous antibiotics. The antibiotics were administered within an appropriate time frame prior to the initiation of the procedure. ANESTHESIA/SEDATION: 0.5 mg IV Versed 0 mcg IV Fentanyl Moderate Sedation Time:  0 minutes The patient was continuously monitored during the procedure by the interventional radiology nurse under my direct supervision. COMPLICATIONS: None TECHNIQUE: Informed written consent was obtained from the patient after a thorough discussion of the procedural risks, benefits and alternatives. All questions were addressed. Maximal Sterile Barrier Technique was utilized including caps, mask, sterile gowns, sterile gloves, sterile drape, hand hygiene and skin antiseptic. A timeout was performed prior to the initiation of the procedure. PROCEDURE: The operative field was prepped with Chlorhexidine in a sterile fashion, and a sterile drape was applied covering the operative field. A sterile gown and sterile gloves were used for the procedure. Local anesthesia was provided with 1% Lidocaine. Patient was positioned supine on the CT gantry table. Scout CT acquired for planning purposes. Patient is prepped and draped in the usual sterile fashion. 1%  lidocaine was used for local anesthesia. Using CT guidance and modified Seldinger technique, a 12 French drain was placed into the fluid and gas collection of the left upper quadrant. Once the drain was in position, proximally 350 cc of frankly purulent material was evacuated. A gravity drain was attached given the presumed bowel injury/tumor. Drain was sutured in position.  Patient tolerated the procedure well and remained hemodynamically stable throughout. No complications were encountered and no significant blood loss. FINDINGS: Planning CT demonstrates fluid and gas collection of the left upper quadrant. 350 cc of frankly purulent material aspirated. Final CT demonstrates adequate positioning of the drain with partial decompression of the cavity. IMPRESSION: Status post CT-guided drainage of left upper quadrant abscess. Signed, Dulcy Fanny. Nadene Rubins, RPVI [facility] Radiology Electronically Signed   By: Corrie Mckusick D.O.   On: 02/16/2022 12:47   CT ABDOMEN PELVIS WO CONTRAST  Result Date: 02/15/2022 CLINICAL DATA:  Abdominal pain, post-op s/p SBR and transverse colectomy for large retroperitoneal mass, fevers, leukocytosis. EXAM: CT ABDOMEN AND PELVIS WITHOUT CONTRAST TECHNIQUE: Multidetector CT imaging of the abdomen and pelvis was performed following the standard protocol without IV contrast. RADIATION DOSE REDUCTION: This exam was performed according to the departmental dose-optimization program which includes automated exposure control, adjustment of the mA and/or kV according to patient size and/or use of iterative reconstruction technique. COMPARISON:  CT 02/03/2022 FINDINGS: Lower chest: Bilateral lower lobe airspace disease, left greater than right. The left lower lobe pulmonary parenchyma is likely heterogeneous. Small left pleural effusion. Decreased density of the blood pool suggests anemia. Hepatobiliary: No evidence of focal hepatic abnormality on this unenhanced exam. Layering gallstones in the gallbladder which is mildly distended. No biliary dilatation. Pancreas: No ductal dilatation or inflammation. Spleen: Subcapsular fluid collection, inferiorly containing air measures approximately 5.1 x 3.7 x 3.1 cm, series 3, image 35 and series 6, image 2018. The spleen is normal in size. Adrenals/Urinary Tract: Normal  adrenal glands. No hydronephrosis. Mild bilateral perinephric edema. Minimally distended urinary bladder. Foley catheter is in place, balloon is likely in the bladder. Air in the bladder is consistent with Foley placement. Stomach/Bowel: Lack of enteric contrast limits detailed bowel assessment. Interval transverse colostomy. There is no small bowel obstruction or postoperative ileus. The appendix is tentatively visualized and normal. Surgical drain entering from the right paramidline courses into the left abdomen and terminates in the left upper quadrant anteriorly. Just distal to the drain is a large heterogeneous ill-defined air fluid collection. Exact size measurement is difficult due to the ill-defined nature, however representative measurements of 12.4 x 13.9 x 14.9 cm, series 3, image 35 and series 6, image 37. This is at the level of the transverse colonic staple line. This may be contiguous with a component extending into the left upper quadrant series 3, image 29. Ill-defined air fluid collection in the left pericolic gutter measures approximately 7.4 x 4.3 x 8.4 cm, series 3, image 49 and series 6, image 85. There are multiple scattered small fluid collections and ill-defined fluid as well as mottled air within the right upper quadrant abdominal fat. Vascular/Lymphatic: Normal caliber abdominal aorta with mild atherosclerosis. Large presumed lymph node adjacent to the celiac axis measures 3.7 cm series 3, image 23. There are multifocal prominent and mildly enlarged retroperitoneal nodes, including 13 mm aortocaval node series 3, image 39. Prominent and mildly enlarged mesenteric, periportal and upper abdominal nodes measuring up to 14 mm series 3, image 32 adjacent to  the fluid collection. Reproductive: No prostatic mass. Other: Recent postsurgical change with open midline abdominal wound. Scattered foci of free air deep to the abdominal wound as well as patchy air within the right upper abdominal fat.  Abdominopelvic fluid collections as described above. Scattered areas of non organized free fluid in the upper abdomen and pelvis. Generalized body Szczesny edema consistent with anasarca. Musculoskeletal: There are no acute or suspicious osseous abnormalities. IMPRESSION: 1. Interval transverse colostomy. Large ill-defined air fluid collection just distal to the surgical drain in the left upper quadrant adjacent to the transverse colonic suture line measuring up to 14.9 cm, suspicious for abscess. There are multiple additional smaller air-fluid collections in the abdomen as described. Areas of non organized free fluid are also seen which are indeterminate. 2. Subcapsular fluid collection about the spleen inferiorly containing air measuring 5.1 x 3.7 x 3.1 cm, suspicious for abscess. 3. Bilateral lower lobe airspace disease, left greater than right, greater than typically seen with atelectasis and suspicious for pneumonia. Small left pleural effusion. 4. Recent postsurgical change with open midline abdominal wound. Scattered foci of free air deep to the abdominal wound as well as patchy air within the right upper quadrant abdominal fat, likely postsurgical. 5. Cholelithiasis without CT findings of acute cholecystitis. 6. Abdominal adenopathy with a dominant lymph node adjacent to the celiac axis. Prominent and mildly enlarged retroperitoneal, mesenteric, periportal, and upper abdominal nodes, nonspecific. 7. Generalized body [place] edema consistent with anasarca. Aortic Atherosclerosis (ICD10-I70.0). These results will be called to the ordering clinician or representative by the Radiologist Assistant, and communication documented in the PACS or [employer]. Electronically Signed   By: Keith Rake M.D.   On: 02/15/2022 18:16   ECHOCARDIOGRAM COMPLETE  Result Date: 02/15/2022    ECHOCARDIOGRAM REPORT   Patient Name:   KEDDRICK WYNE Mayeda Date of Exam: 02/15/2022 Medical Rec #:  [id number]    Height:       72.0 in  Accession #:    [id number]   Weight:       247.8 lb Date of Birth:  03-14-1970   BSA:          2.334 m Patient Age:    89 years     BP:           106/65 mmHg Patient Gender: M            HR:           104 bpm. Exam Location:  Inpatient Procedure: 2D Echo, Color Doppler and Cardiac Doppler Indications:    Fluid overload [[id number]]  History:        Patient has no prior history of Echocardiogram examinations.                 CAD, Prior CABG; Risk Factors:Diabetes.  Sonographer:    Greer Pickerel Referring Phys: [id number] Saverio Danker  Sonographer Comments: Patient is obese. Image acquisition challenging due to respiratory motion. IMPRESSIONS  1. Left ventricular ejection fraction, by estimation, is 50%. The left ventricle has mildly decreased function. The left ventricle demonstrates global hypokinesis with septal bounce suggesting prior cardiac surgery. Left ventricular diastolic parameters  are consistent with Grade II diastolic dysfunction (pseudonormalization).  2. Peak RV-RA gradient 14 mmHg. Right ventricular systolic function is normal. The right ventricular size is normal.  3. Left atrial size was moderately dilated.  4. Right atrial size was mildly dilated.  5. The mitral valve is normal in structure. No evidence of mitral valve regurgitation. No  evidence of mitral stenosis.  6. The aortic valve is tricuspid. Aortic valve regurgitation is not visualized. No aortic stenosis is present.  7. Aortic dilatation noted. There is mild dilatation of the ascending aorta, measuring 40 mm.  8. Technically difficult study with poor acoustic windows. FINDINGS  Left Ventricle: Left ventricular ejection fraction, by estimation, is 50%. The left ventricle has mildly decreased function. The left ventricle demonstrates global hypokinesis. The left ventricular internal cavity size was normal in size. There is no left ventricular hypertrophy. Left ventricular diastolic parameters are consistent with Grade II diastolic dysfunction  (pseudonormalization). Right Ventricle: Peak RV-RA gradient 14 mmHg. The right ventricular size is normal. No increase in right ventricular [place] thickness. Right ventricular systolic function is normal. Left Atrium: Left atrial size was moderately dilated. Right Atrium: Right atrial size was mildly dilated. Pericardium: There is no evidence of pericardial effusion. Mitral Valve: The mitral valve is normal in structure. No evidence of mitral valve regurgitation. No evidence of mitral valve stenosis. Tricuspid Valve: The tricuspid valve is normal in structure. Tricuspid valve regurgitation is trivial. Aortic Valve: The aortic valve is tricuspid. Aortic valve regurgitation is not visualized. No aortic stenosis is present. Pulmonic Valve: The pulmonic valve was normal in structure. Pulmonic valve regurgitation is not visualized. Aorta: The aortic root is normal in size and structure and aortic dilatation noted. There is mild dilatation of the ascending aorta, measuring 40 mm. Venous: The inferior vena cava was not well visualized. IAS/Shunts: No atrial level shunt detected by color flow Doppler.  LEFT VENTRICLE PLAX 2D LVIDd:         5.30 cm   Diastology LVIDs:         3.70 cm   LV e' medial:    10.90 cm/s LV PW:         1.10 cm   LV E/e' medial:  11.3 LV IVS:        0.80 cm   LV e' lateral:   16.60 cm/s LVOT diam:     2.40 cm   LV E/e' lateral: 7.4 LV SV:         86 LV SV Index:   37 LVOT Area:     4.52 cm  RIGHT VENTRICLE RV S prime:     12.30 cm/s TAPSE (M-mode): 2.2 cm LEFT ATRIUM            Index        RIGHT ATRIUM           Index LA diam:      4.90 cm  2.10 cm/m   RA Area:     19.40 cm LA Vol (A2C): 47.4 ml  20.31 ml/m  RA Volume:   48.70 ml  20.87 ml/m LA Vol (A4C): 100.0 ml 42.85 ml/m  AORTIC VALVE             PULMONIC VALVE LVOT Vmax:   120.00 cm/s PR End Diast Vel: 1.83 msec LVOT Vmean:  80.400 cm/s LVOT VTI:    0.191 m  AORTA Ao Root diam: 3.80 cm Ao Asc diam:  4.00 cm MITRAL VALVE                 TRICUSPID VALVE MV Area (PHT): 2.33 cm     TR Peak grad:   14.6 mmHg MV Decel Time: 325 msec     TR Vmax:        191.00 cm/s MV E velocity: 123.00 cm/s MV A velocity: 43.50 cm/s  SHUNTS MV E/A ratio:  2.83         Systemic VTI:  0.19 m                             Systemic Diam: 2.40 cm Dalton McleanMD Electronically signed by Franki Monte Signature Date/Time: 02/15/2022/5:47:20 PM    Final     Anti-infectives: Anti-infectives (From admission, onward)    Start     Dose/Rate Route Frequency Ordered Stop   02/17/22 1200  piperacillin-tazobactam (ZOSYN) IVPB 3.375 g        3.375 g 12.5 mL/hr over 240 Minutes Intravenous Every 8 hours 02/17/22 1103     02/15/22 1500  piperacillin-tazobactam (ZOSYN) IVPB 3.375 g        3.375 g 12.5 mL/hr over 240 Minutes Intravenous Every 8 hours 02/15/22 0836 02/17/22 0217   02/17/2022 1100  piperacillin-tazobactam (ZOSYN) IVPB 3.375 g        3.375 g 100 mL/hr over 30 Minutes Intravenous  Once 02/14/2022 1056 02/16/2022 1230   01/23/2022 0200  piperacillin-tazobactam (ZOSYN) IVPB 3.375 g  Status:  Discontinued        3.375 g 12.5 mL/hr over 240 Minutes Intravenous Every 8 hours 01/31/2022 2322 02/15/22 0836   02/15/2022 1900  piperacillin-tazobactam (ZOSYN) IVPB 3.375 g        3.375 g 100 mL/hr over 30 Minutes Intravenous  Once 01/22/2022 1857 01/27/2022 2012       Assessment/Plan: 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  Has a lot going on and not ready to give him more to eat yet, despite some bowel function. -conts to remain febrile. - TNA - JP surgical serous today, IR drain bloody cloudy -  increase dressing changes to midline wound to help debride some slough and combat drainage to TID - WOC following for new ostomy - IV abx stopped due to 5 days post op, restarted these for abdominal infection and lungs.  - surgical path is pending  -PT/OT evals -CBC and CMET daily  AKI - up to 2.25.  Still making urine.  ? Secondary to some hypotension.  Not really on anything nephrotoxic at this time.  Reviewed all medications with pharmacy and medicine yesterday and nothing that needs to be adjusted at this time.  Albumin given per medicine yesterday, but with no change to kidney function, BP, or HR at this time.  I have called nephrology to assist as I think the worsening respiratory status is possibly due to fluid overload, but need to be careful diuresing due to increasing Cr.  Will await their thoughts and recommendations. Fluid overload/hypoalbuminemia - 13L + this admission.  Cont full rate TNA.  Echo reviewed and EF preserved but with some hypokinesis.   LLL PNA - pulm toilet. IS.  Noted on CT.  On zosyn already which should cover this for now.  Repeat CXR today given worsening hypoxia and need to continue to increase O2 needs.  Check ABG as well. CAD, s/p CABG - no chest pain, echo as above DM - SSI Hyperbilirubinemia - unclear etiology.  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  Gallstones noted on CT, but no ductal dilatation.  Other LFTs essentially normal.  Continue to monitor.  Hep panel ordered by medicine, monitor this.  Follow labs Chronic osteomyelitis of left foot - s/p amputation ABL anemia - 2 units prbc given today for hgb of 6.7.  monitor labs  This patient's labs, imaging, vitals, I/O, chart have all been reviewed along with notes from medical team and nursing.  I have personally spoken to the medical consulting team today regarding all aspects of the patient's care as well as with Dr. Augustin Coupe with nephrology who will  consult on the patient.    FEN: CLD/TNA/albumin VTE: stop lovenox and transition to heparin due to AKI ID: Zosyn 1/23>>      LOS: 8 days    Henreitta Cea PA-C 11:13 AM 02/17/2022  Use [email or web address] to contact on call provider  02/17/2022

## 2022-02-17 NOTE — Progress Notes (Signed)
Referring Physician(s): Dr. Bobbye Morton  Supervising Physician: Ruthann Cancer  Patient Status:  [facility] - In-pt  Chief Complaint: RP mass with necrotic jenjunum s/p ex lap with resection of transverse colon, mesenteric abscess drainage, resection of proximal jejunum and diverting R colostomy. Post-op large air/fluid collection distal to the surgical drain in the LUQ suspicious for abscess. Seen in IR 02/16/22 for drain placement. 12 Fr drain placed to the LUQ by Dr. Earleen Newport.   Subjective: Patient in bed awake and resting. He denies any significant pain/discomfort.   Allergies: Morphine  Medications: Prior to Admission medications   Medication Sig Start Date End Date Taking? Authorizing Provider  APPLE CIDER VINEGAR PO Take 450 mg by mouth daily.   Yes [provider]  aspirin EC (BAYER ASPIRIN EC LOW DOSE) 81 MG tablet Take 81 mg by mouth daily.   Yes [provider]  atorvastatin (LIPITOR) 20 MG tablet Take 20 mg by mouth every evening.   Yes [provider]  escitalopram (LEXAPRO) 20 MG tablet Take 20 mg by mouth daily. 01/04/22  Yes [provider]  folic acid (FOLVITE) 1 MG tablet Take 1 tablet by mouth daily. 12/04/21  Yes [provider]  glipiZIDE (GLUCOTROL) 5 MG tablet Take 5 mg by mouth 2 (two) times daily before a meal. 05/22/20  Yes [provider]  omeprazole (PRILOSEC) 20 MG capsule Take 20 mg by mouth daily. 01/14/22  Yes [provider]     Vital Signs: BP (!) 106/59   Pulse (!) 104   Temp 98.2 F (36.8 C) (Axillary)   Resp (!) 22   Ht 6' (1.829 m)   Wt 247 lb 12.8 oz (112.4 kg)   SpO2 95%   BMI 33.61 kg/m   Physical Exam Constitutional:      General: He is not in acute distress. Abdominal:     Palpations: Abdomen is soft.     Tenderness: There is abdominal tenderness.     Comments: LUQ drain to gravity. Approximately 30 ml of feculent output in bag. Drain easily flushes/aspirates with 10 ml NS.    Skin:    General: Skin is warm and dry.  Neurological:     Mental Status: He is alert and oriented to person, place, and time.    Imaging: DG CHEST PORT 1 VIEW  Result Date: 02/17/2022 CLINICAL DATA:  Hypoxia. EXAM: PORTABLE CHEST 1 VIEW COMPARISON:  February 15, 2022. FINDINGS: Stable cardiomegaly. Status post coronary bypass graft. Stable probable right basilar subsegmental atelectasis. Stable left basilar atelectasis or infiltrate is noted with associated pleural effusion. Bony thorax is unremarkable. IMPRESSION: Stable bilateral lung opacities as noted above. Electronically Signed   By: Marijo Conception M.D.   On: 02/17/2022 11:57   CT GUIDED VISCERAL FLUID DRAIN BY PERC CATH  Result Date: 02/16/2022 INDICATION: 52 year old male abdominal abscess referred for drainage EXAM: CT GUIDED DRAINAGE OF  ABSCESS MEDICATIONS: The patient is currently admitted to the hospital and receiving intravenous antibiotics. The antibiotics were administered within an appropriate time frame prior to the initiation of the procedure. ANESTHESIA/SEDATION: 0.5 mg IV Versed 0 mcg IV Fentanyl Moderate Sedation Time:  0 minutes The patient was continuously monitored during the procedure by the interventional radiology nurse under my direct supervision. COMPLICATIONS: None TECHNIQUE: Informed written consent was obtained from the patient after a thorough discussion of the procedural risks, benefits and alternatives. All questions were addressed. Maximal Sterile Barrier Technique was utilized including caps, mask, sterile gowns, sterile gloves,  sterile drape, hand hygiene and skin antiseptic. A timeout was performed prior to the initiation of the procedure. PROCEDURE: The operative field was prepped with Chlorhexidine in a sterile fashion, and a sterile drape was applied covering the operative field. A sterile gown and sterile gloves were used for the procedure. Local anesthesia was provided with 1% Lidocaine. Patient was  positioned supine on the CT gantry table. Scout CT acquired for planning purposes. Patient is prepped and draped in the usual sterile fashion. 1% lidocaine was used for local anesthesia. Using CT guidance and modified Seldinger technique, a 12 French drain was placed into the fluid and gas collection of the left upper quadrant. Once the drain was in position, proximally 350 cc of frankly purulent material was evacuated. A gravity drain was attached given the presumed bowel injury/tumor. Drain was sutured in position. Patient tolerated the procedure well and remained hemodynamically stable throughout. No complications were encountered and no significant blood loss. FINDINGS: Planning CT demonstrates fluid and gas collection of the left upper quadrant. 350 cc of frankly purulent material aspirated. Final CT demonstrates adequate positioning of the drain with partial decompression of the cavity. IMPRESSION: Status post CT-guided drainage of left upper quadrant abscess. Signed, Dulcy Fanny. Nadene Rubins, RPVI [facility] Radiology Electronically Signed   By: Corrie Mckusick D.O.   On: 02/16/2022 12:47   CT ABDOMEN PELVIS WO CONTRAST  Result Date: 02/15/2022 CLINICAL DATA:  Abdominal pain, post-op s/p SBR and transverse colectomy for large retroperitoneal mass, fevers, leukocytosis. EXAM: CT ABDOMEN AND PELVIS WITHOUT CONTRAST TECHNIQUE: Multidetector CT imaging of the abdomen and pelvis was performed following the standard protocol without IV contrast. RADIATION DOSE REDUCTION: This exam was performed according to the departmental dose-optimization program which includes automated exposure control, adjustment of the mA and/or kV according to patient size and/or use of iterative reconstruction technique. COMPARISON:  CT 02/01/2022 FINDINGS: Lower chest: Bilateral lower lobe airspace disease, left greater than right. The left lower lobe pulmonary parenchyma is likely  heterogeneous. Small left pleural effusion. Decreased density of the blood pool suggests anemia. Hepatobiliary: No evidence of focal hepatic abnormality on this unenhanced exam. Layering gallstones in the gallbladder which is mildly distended. No biliary dilatation. Pancreas: No ductal dilatation or inflammation. Spleen: Subcapsular fluid collection, inferiorly containing air measures approximately 5.1 x 3.7 x 3.1 cm, series 3, image 35 and series 6, image 2018. The spleen is normal in size. Adrenals/Urinary Tract: Normal adrenal glands. No hydronephrosis. Mild bilateral perinephric edema. Minimally distended urinary bladder. Foley catheter is in place, balloon is likely in the bladder. Air in the bladder is consistent with Foley placement. Stomach/Bowel: Lack of enteric contrast limits detailed bowel assessment. Interval transverse colostomy. There is no small bowel obstruction or postoperative ileus. The appendix is tentatively visualized and normal. Surgical drain entering from the right paramidline courses into the left abdomen and terminates in the left upper quadrant anteriorly. Just distal to the drain is a large heterogeneous ill-defined air fluid collection. Exact size measurement is difficult due to the ill-defined nature, however representative measurements of 12.4 x 13.9 x 14.9 cm, series 3, image 35 and series 6, image 37. This is at the level of the transverse colonic staple line. This may be contiguous with a component extending into the left upper quadrant series 3, image 29. Ill-defined air fluid collection in the left pericolic gutter measures approximately 7.4 x 4.3 x 8.4 cm, series 3, image 49 and series 6, image 85. There  are multiple scattered small fluid collections and ill-defined fluid as well as mottled air within the right upper quadrant abdominal fat. Vascular/Lymphatic: Normal caliber abdominal aorta with mild atherosclerosis. Large presumed lymph node adjacent to the celiac axis  measures 3.7 cm series 3, image 23. There are multifocal prominent and mildly enlarged retroperitoneal nodes, including 13 mm aortocaval node series 3, image 39. Prominent and mildly enlarged mesenteric, periportal and upper abdominal nodes measuring up to 14 mm series 3, image 32 adjacent to the fluid collection. Reproductive: No prostatic mass. Other: Recent postsurgical change with open midline abdominal wound. Scattered foci of free air deep to the abdominal wound as well as patchy air within the right upper abdominal fat. Abdominopelvic fluid collections as described above. Scattered areas of non organized free fluid in the upper abdomen and pelvis. Generalized body Golembeski edema consistent with anasarca. Musculoskeletal: There are no acute or suspicious osseous abnormalities. IMPRESSION: 1. Interval transverse colostomy. Large ill-defined air fluid collection just distal to the surgical drain in the left upper quadrant adjacent to the transverse colonic suture line measuring up to 14.9 cm, suspicious for abscess. There are multiple additional smaller air-fluid collections in the abdomen as described. Areas of non organized free fluid are also seen which are indeterminate. 2. Subcapsular fluid collection about the spleen inferiorly containing air measuring 5.1 x 3.7 x 3.1 cm, suspicious for abscess. 3. Bilateral lower lobe airspace disease, left greater than right, greater than typically seen with atelectasis and suspicious for pneumonia. Small left pleural effusion. 4. Recent postsurgical change with open midline abdominal wound. Scattered foci of free air deep to the abdominal wound as well as patchy air within the right upper quadrant abdominal fat, likely postsurgical. 5. Cholelithiasis without CT findings of acute cholecystitis. 6. Abdominal adenopathy with a dominant lymph node adjacent to the celiac axis. Prominent and mildly enlarged retroperitoneal, mesenteric, periportal, and upper abdominal nodes,  nonspecific. 7. Generalized body Gaida edema consistent with anasarca. Aortic Atherosclerosis (ICD10-I70.0). These results will be called to the ordering clinician or representative by the Radiologist Assistant, and communication documented in the PACS or [employer]. Electronically Signed   By: Keith Rake M.D.   On: 02/15/2022 18:16   ECHOCARDIOGRAM COMPLETE  Result Date: 02/15/2022    ECHOCARDIOGRAM REPORT   Patient Name:   PRUITT TABOADA Stigall Date of Exam: 02/15/2022 Medical Rec #:  [id number]    Height:       72.0 in Accession #:    [id number]   Weight:       247.8 lb Date of Birth:  Sep 14, 1970   BSA:          2.334 m Patient Age:    68 years     BP:           106/65 mmHg Patient Gender: M            HR:           104 bpm. Exam Location:  Inpatient Procedure: 2D Echo, Color Doppler and Cardiac Doppler Indications:    Fluid overload [[id number]]  History:        Patient has no prior history of Echocardiogram examinations.                 CAD, Prior CABG; Risk Factors:Diabetes.  Sonographer:    Greer Pickerel Referring Phys: [id number] Saverio Danker  Sonographer Comments: Patient is obese. Image acquisition challenging due to respiratory motion. IMPRESSIONS  1. Left ventricular ejection fraction, by estimation, is 50%.  The left ventricle has mildly decreased function. The left ventricle demonstrates global hypokinesis with septal bounce suggesting prior cardiac surgery. Left ventricular diastolic parameters  are consistent with Grade II diastolic dysfunction (pseudonormalization).  2. Peak RV-RA gradient 14 mmHg. Right ventricular systolic function is normal. The right ventricular size is normal.  3. Left atrial size was moderately dilated.  4. Right atrial size was mildly dilated.  5. The mitral valve is normal in structure. No evidence of mitral valve regurgitation. No evidence of mitral stenosis.  6. The aortic valve is tricuspid. Aortic valve regurgitation is not visualized. No aortic stenosis is present.  7. Aortic  dilatation noted. There is mild dilatation of the ascending aorta, measuring 40 mm.  8. Technically difficult study with poor acoustic windows. FINDINGS  Left Ventricle: Left ventricular ejection fraction, by estimation, is 50%. The left ventricle has mildly decreased function. The left ventricle demonstrates global hypokinesis. The left ventricular internal cavity size was normal in size. There is no left ventricular hypertrophy. Left ventricular diastolic parameters are consistent with Grade II diastolic dysfunction (pseudonormalization). Right Ventricle: Peak RV-RA gradient 14 mmHg. The right ventricular size is normal. No increase in right ventricular Krupp thickness. Right ventricular systolic function is normal. Left Atrium: Left atrial size was moderately dilated. Right Atrium: Right atrial size was mildly dilated. Pericardium: There is no evidence of pericardial effusion. Mitral Valve: The mitral valve is normal in structure. No evidence of mitral valve regurgitation. No evidence of mitral valve stenosis. Tricuspid Valve: The tricuspid valve is normal in structure. Tricuspid valve regurgitation is trivial. Aortic Valve: The aortic valve is tricuspid. Aortic valve regurgitation is not visualized. No aortic stenosis is present. Pulmonic Valve: The pulmonic valve was normal in structure. Pulmonic valve regurgitation is not visualized. Aorta: The aortic root is normal in size and structure and aortic dilatation noted. There is mild dilatation of the ascending aorta, measuring 40 mm. Venous: The inferior vena cava was not well visualized. IAS/Shunts: No atrial level shunt detected by color flow Doppler.  LEFT VENTRICLE PLAX 2D LVIDd:         5.30 cm   Diastology LVIDs:         3.70 cm   LV e' medial:    10.90 cm/s LV PW:         1.10 cm   LV E/e' medial:  11.3 LV IVS:        0.80 cm   LV e' lateral:   16.60 cm/s LVOT diam:     2.40 cm   LV E/e' lateral: 7.4 LV SV:         86 LV SV Index:   37 LVOT Area:     4.52  cm  RIGHT VENTRICLE RV S prime:     12.30 cm/s TAPSE (M-mode): 2.2 cm LEFT ATRIUM            Index        RIGHT ATRIUM           Index LA diam:      4.90 cm  2.10 cm/m   RA Area:     19.40 cm LA Vol (A2C): 47.4 ml  20.31 ml/m  RA Volume:   48.70 ml  20.87 ml/m LA Vol (A4C): 100.0 ml 42.85 ml/m  AORTIC VALVE             PULMONIC VALVE LVOT Vmax:   120.00 cm/s PR End Diast Vel: 1.83 msec LVOT Vmean:  80.400 cm/s LVOT VTI:  0.191 m  AORTA Ao Root diam: 3.80 cm Ao Asc diam:  4.00 cm MITRAL VALVE                TRICUSPID VALVE MV Area (PHT): 2.33 cm     TR Peak grad:   14.6 mmHg MV Decel Time: 325 msec     TR Vmax:        191.00 cm/s MV E velocity: 123.00 cm/s MV A velocity: 43.50 cm/s   SHUNTS MV E/A ratio:  2.83         Systemic VTI:  0.19 m                             Systemic Diam: 2.40 cm Dalton McleanMD Electronically signed by Franki Monte Signature Date/Time: 02/15/2022/5:47:20 PM    Final    DG CHEST PORT 1 VIEW  Result Date: 02/15/2022 CLINICAL DATA:  Chest pain and hypoxia. Status post colectomy and colostomy creation. EXAM: PORTABLE CHEST 1 VIEW COMPARISON:  CT abdomen 02/05/2022 and abdominal radiograph 02/06/2022 FINDINGS: Low lung volumes with some elevation of left hemidiaphragm. Patchy densities at the right lung base most likely represent atelectasis. Persistent densities at the left lung base. There is a right arm PICC line with the tip near the superior cavoatrial junction. Heart size appears to be upper limits of normal with post CABG changes. IMPRESSION: Low lung volumes with bibasilar lung densities. Findings could represent a combination of atelectasis and consolidation. Cannot exclude pleural effusions. Electronically Signed   By: Markus Daft M.D.   On: 02/15/2022 11:15   DG Abd 1 View  Result Date: 02/14/2022 CLINICAL DATA:  52 year old male status post NG tube placement. Status post exploratory laparotomy postoperative day 4, retroperitoneal mass with invasion of proximal  small bowel, transverse colon, perforation and fecal contamination. EXAM: ABDOMEN - 1 VIEW COMPARISON:  CT Abdomen and Pelvis 02/08/2022. FINDINGS: Prior CABG. There is evidence of a linear enteric tube in the midline projecting over the thoracic spine to the T7-T8 level. Tip is just at the level of the diaphragm. Side hole is not included. Left upper quadrant surgical clips are new. Paucity of bowel gas in the upper abdomen. Left lung base opacity also appears increased. IMPRESSION: 1. Enteric tube projects over the midline thoracic spine, tip at the level of the diaphragm. This should probably be advanced about 12 cm to allow for side hole placement within the stomach. Recommend repeat portable KUB at that time. 2. Recent postoperative changes in the left upper quadrant. Increased left lung base opacity since 02/14/2022. Electronically Signed   By: Genevie Ann M.D.   On: 02/14/2022 05:32    Labs:  CBC: Recent Labs    02/14/22 0445 02/15/22 0441 02/16/22 0520 02/17/22 0542 02/17/22 1151  WBC 16.6* 16.1* 16.3* 16.9*  --   HGB 8.4* 7.8* 7.4* 6.7* 8.8*  HCT 28.4* 25.2* 24.8* 21.4* 26.0*  PLT 459* 378 389 358  --     COAGS: Recent Labs    02/16/22 0631  INR 1.4*    BMP: Recent Labs    02/14/22 0445 02/15/22 0441 02/16/22 0520 02/17/22 0542 02/17/22 1151  NA 138 134* 134* 136 138  K 3.8 3.8 4.2 4.2 4.3  CL 104 101 101 101  --   CO2 '22 25 26 22  '$ --   GLUCOSE 180* 210* 209* 192*  --   BUN 16 24* 36* 41*  --   CALCIUM  7.3* 7.3* 7.3* 7.5*  --   CREATININE 1.18 1.51* 2.02* 2.25*  --   GFRNONAA >60 56* 39* 35*  --     LIVER FUNCTION TESTS: Recent Labs    02/13/22 0500 02/15/22 0441 02/16/22 0631 02/17/22 0542  BILITOT 3.4* 5.9*  6.2* 6.8* 6.4*  AST 20 34  33 50* 41  ALT '11 18  16 20 19  '$ ALKPHOS 49 75  75 78 67  PROT 4.0* 4.4*  4.5* 4.6* 5.0*  ALBUMIN 1.7* 1.6*  1.6* 1.5* 2.1*    Assessment and Plan:  RP mass with necrotic jenjunum s/p ex lap with resection of  transverse colon, mesenteric abscess drainage, resection of proximal jejunum and diverting R colostomy. Post-op large air/fluid collection distal to the surgical drain in the LUQ suspicious for abscess. Seen in IR 02/16/22 for drain placement. 12 Fr drain placed to the LUQ by Dr. Earleen Newport.   Drain Location: LUQ Size: Fr size: 12 Fr Date of placement: 02/16/22 Currently to: gravity bag 24 hour output:  Output by Drain (mL) 02/15/22 0700 - 02/15/22 1459 02/15/22 1500 - 02/15/22 2259 02/15/22 2300 - 02/16/22 0659 02/16/22 0700 - 02/16/22 1459 02/16/22 1500 - 02/16/22 2259 02/16/22 2300 - 02/17/22 0659 02/17/22 0700 - 02/17/22 1348  Closed System Drain 1 Right Abdomen Bulb (JP) 19 Fr.  45 80  45 40   Closed System Drain 1 Left Abdomen Other (Comment) 12 Fr.    350 150 150     Interval imaging/drain manipulation:  none  Current examination: Flushes/aspirates easily.  Insertion site unremarkable. Suture and stat lock in place. Dressed appropriately.   Plan: Continue TID flushes with 5 cc NS. Record output Q shift. Dressing changes QD or PRN if soiled.  Call IR APP or on call IR MD if difficulty flushing or sudden change in drain output.  Repeat imaging/possible drain injection once output < 10 mL/QD (excluding flush material). Consideration for drain removal if output is < 10 mL/QD (excluding flush material), pending discussion with the providing surgical service.  Discharge planning: Please contact IR APP or on call IR MD prior to patient d/c to ensure appropriate follow up plans are in place. Typically patient will follow up with IR clinic 10-14 days post d/c for repeat imaging/possible drain injection. IR scheduler will contact patient with date/time of appointment. Patient will need to flush drain QD with 5 cc NS, record output QD, dressing changes every 2-3 days or earlier if soiled.   IR will continue to follow - please call with questions or concerns.  Electronically Signed: Soyla Dryer, AGACNP-BC [phone] 02/17/2022, 1:44 PM   I spent a total of 15 Minutes at the the patient's bedside AND on the patient's hospital floor or unit, greater than 50% of which was counseling/coordinating care for LUQ drain

## 2022-02-17 NOTE — Progress Notes (Signed)
PHARMACY - TOTAL PARENTERAL NUTRITION CONSULT NOTE  Indication: Prolonged ileus  Patient Measurements: Height: 6' (182.9 cm) Weight: 112.4 kg (247 lb 12.8 oz) IBW/kg (Calculated) : 77.6 TPN AdjBW (KG): 86.6 Body mass index is 33.61 kg/m.  Assessment:  33 YOM presented with abdominal pain for 4 weeks, found to have retroperitoneal mass with necrotic jejunum, perforated fecal contamination and abscess. 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 Patient has been NPO for 4 days since admission. Patient/wife report he wasn't eating well for 4 weeks PTA and basically on a liquid diet. He endorses a ~10 lb weight loss during this time. Previously, he typically ate 2 meals a day (chicken, vegetables, beans and fruits) and 1 snack. Pharmacy consulted to dose TPN.  Glucose / Insulin: hx DM (A1C 6.6) on glipizide PTA. CBGs 166-192, received 20 units mSSI/24hrs  Electrolytes: all stable WNL Renal: AKI - SCr up to 2.25 (BL 1), BUN up to 41 S/p Lasix '20mg'$  IV daily 1/29-1/30 Hepatic: LFTs WNL, Tbili down 6.4, TG 174 stable, albumin 2.1 S/p thiamine x 3 days (1/26>1/28) Intake / Output; MIVF: UOP 0.6 ml/kg/hr, drain 556m, colostomy 354m net +14L this admit (increased) GI Imaging: 1/23 CT abd pelvis - Marked severity colitis with adjacent abscess  1/29 CT abd pelvis - suspicious for abscesses and PNA, small pleural effusion, anasarca GI Surgeries / Procedures: 1/24 s/p ex lap with resection of transverse colon, drainage of mesenteric abscess, resection of proximal jejunum with SB anastomosis and diverting R colostomy and long Hartman's pouch 1/30 IR per drain placement  Central access: PICC 02/12/22 TPN start date: 02/12/22  Nutritional Goals per RD assessment: Estimated Needs Total Energy Estimated Needs: 2300-2500 Total Protein Estimated Needs:  120-135 grams Total Fluid Estimated Needs: >2 L/day   Goal TPN rate is 100 ml/hr, which will provide 132g protein, 324g dextrose, 2350 kcal in a 24-hr period.  Current Nutrition:  TPN Back to CLD post-procedure 1/30 (tolerated 1/29)  Plan:  Continue TPN at goal rate 100 ml/hr at 1800  Electrolytes in TPN: Na 100 mEq/L, K 45 mEq/L, Ca 3 mEq/L, Mg 4 mEq/L, Phos 15 mmol/L; Cl:Ac 1:2 Add standard MVI to TPN Remove standard trace elements with Tbili >6. Add back zinc '5mg'$  to TPN bag. Remove selenium/chromium for now with worsening AKI. Continue moderate SSI Q4H and adjust as needed  Increase regular insulin to 50 units added to TPN bag Monitor TPN labs daily until stable, then standard on Mon/Thurs - watch SCr trend closely and adjust electrolytes as needed   HaArturo MortonPharmD, BCPS Please check AMION for all MCLakesideontact numbers Clinical Pharmacist 02/17/2022 7:51 AM

## 2022-02-17 NOTE — Progress Notes (Signed)
Nutrition Follow-up  DOCUMENTATION CODES:   Severe malnutrition in context of acute illness/injury  INTERVENTION:  Continue TPN to meet nutrition needs.  Once diet is able to be advanced to clear liquids again, provide PROSource Plus 30 mL po TID, each supplement provides 100 kcal and 15 grams of protein.  NUTRITION DIAGNOSIS:   Severe Malnutrition related to acute illness (mass with perforation) as evidenced by moderate fat depletion, energy intake < or equal to 50% for > or equal to 5 days.  Ongoing.  GOAL:   Patient will meet greater than or equal to 90% of their needs  Met with TPN.  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  1/24: s/p ex lap with resection of transverse colon, drainage of mesenteric abscess, resection of proximal jejunum with SB anastomosis and diverting R colostomy and long Hartman's pouch  1/26: started TPN  1/28: NG tube removed 1/30: s/p placement 12 Fr. Pigtail drain in LUQ; diet advanced to clear liquids 1/31: pt changed back to NPO later in afternoon   Met with pt at bedside. He reports he has been tolerating clear liquids well. He has had Ginger-ale, water, broth, and frozen ice. RN reports he has been finishing 100% of clear liquid trays. Pt denies nausea or abdominal pain. He is passing gas in ostomy. Discussed clear oral nutrition supplements available for pt to try. He reports he has had Boost Breeze before and did not tolerate well. He is amenable to trying PROSource Plus. However, after RD assessment pt was made NPO so no oral  nutrition supplements were ordered.  Admission wt was 113.4 kg, but unsure if that was a true measured wt or a stated wt. Most recent weight was 112.4 kg on 02/15/22. Will continue to monitor trends.  IV Access: triple lumen PICC right brachial placed 02/12/22  TPN order (02/17/22): Access: Central Dosing weight: 112.4 kg Dextrose: 324 grams (47% kcal from dextrose) Amino acids: 132 grams (22% kcal from amino acids) SMOF lipid: 72 grams (31% kcal from lipids) Additives: 10 mL adult MVI, 50 units regular insulin (increased from 45 previous order), 5 mg zinc chloride Rate: 100 mL/hour Provides: 2349.6 kcal, 132 grams protein Noted trace elements and chromium chloride were removed from TPN.  Medications reviewed and include: Novolog 0-15 units Q4hrs, human albumin 25 grams Q6hrs IV, Lasix 2 mL/hour, norepinephrine ordered (not yet started), Zosyn  Labs reviewed: CBG 163-190, BUN 41, Creatinine 2.25. Triglycerides 174 02/15/22.  UOP: 1725 mL (0.6 mL/kg/hr)  Colostomy output: 25 mL  Surgical drain output: 85 mL IR/LUQ drain output: 650 mL  I/O: +14205.6 mL since admission  Discussed with RN.  After noting pt was made NPO again after RD assessment, reached out to Surgery PA. Pt is being made NPO due to current respiratory status. Pt is on HFNC 10 L/min.  Diet Order:   Diet Order             Diet NPO time specified Except for: Sips with Meds, Ice Chips  Diet effective now  EDUCATION NEEDS:   Not appropriate for education at this time  Skin:  Skin Assessment: Reviewed RN Assessment  Last BM:  25 mL colostomy output in previous 24 hours  Height:   Ht Readings from Last 1 Encounters:  02/03/2022 6' (1.829 m)   Weight:   Wt Readings from Last 1 Encounters:  02/15/22 112.4 kg   BMI:  Body mass index is 33.61 kg/m.  Estimated Nutritional Needs:   Kcal:  2300-2500  Protein:  120-135 grams  Fluid:  >2 L/day  Loanne Drilling, MS, RD, LDN,  CNSC Pager number available on Amion

## 2022-02-17 NOTE — Progress Notes (Signed)
Occupational Therapy Treatment Patient Details Name: Patrick [name] MRN: [id number] DOB: [birth year] Today's Date: 02/17/2022   History of present illness Pt is a 52 y/o male admitted 1/24 with progressive anorexia, weight loss and abdominal mass.  1/24 pt s/p exp. Lap. With resection of transverse colon, drainage of mesenteric abscess, resection of proximal jejunum with small bowel anastomosis and diverting right colostomy.  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   OT comments  Pt in bed upon therapy arrival and with encouragement agreed to participate in therapy session. Session focused on bed mobility, sitting/standing balance, and functional transfer from bed to recliner while managing RW safely. Pt declined to brush his teeth or performance any type of functional mobility within his room or the hallway. Due to patient's limited participation it would be appropriate to decrease frequency to once a week minimum for skilled OT. Frequency may always be increased if patient's functional status changes. Acute OT will continue to follow patient to focus on functional ADL tasks and over all endurance and activity tolerance.    Recommendations for follow up therapy are one component of a multi-disciplinary discharge planning process, led by the attending physician.  Recommendations may be updated based on patient status, additional functional criteria and insurance authorization.    Follow Up Recommendations  No OT follow up     Assistance Recommended at Discharge Intermittent Supervision/Assistance  Patient can return home with the following  Assist for transportation;Assistance with cooking/housework;A little help with bathing/dressing/bathroom;A little help with walking and/or transfers   Equipment Recommendations  BSC/3in1;Tub/shower bench       Precautions / Restrictions Precautions Precautions: Fall Precaution  Comments: NG tube, JP Drain, watch O2 Restrictions Weight Bearing Restrictions: No       Mobility Bed Mobility Overal bed mobility: Needs Assistance Bed Mobility: Supine to Sit     Supine to sit: Min assist, HOB elevated     General bed mobility comments: light min assist provided to transiton to seated EOB Patient Response: Flat affect  Transfers Overall transfer level: Needs assistance Equipment used: Rolling walker (2 wheels) Transfers: Sit to/from Stand, Bed to chair/wheelchair/BSC Sit to Stand: Min guard, +2 physical assistance (2nd person assisting although not physically necessary to provide support)     Step pivot transfers: Min guard           Balance   Sitting-balance support: No upper extremity supported, Feet supported Sitting balance-Leahy Scale: Fair Sitting balance - Comments: seated EOB   Standing balance support: Bilateral upper extremity supported, During functional activity Standing balance-Leahy Scale: Poor Standing balance comment: relied on RW for balance while standing           ADL either performed or assessed with clinical judgement      Cognition Arousal/Alertness: Awake/alert Behavior During Therapy: Flat affect Overall Cognitive Status: Within Functional Limits for tasks assessed     General Comments: self limiting              General Comments VSS    Pertinent Vitals/ Pain       Pain Assessment Pain Assessment: Faces Faces Pain Scale: Hurts little more Pain Location: abdomen and back Pain Descriptors / Indicators: Grimacing Pain Intervention(s): Monitored during session, Limited activity within patient's tolerance         Frequency  Min 1X/week        Progress Toward Goals  OT Goals(current goals can now be found in the care  plan section)  Progress towards OT goals: Not progressing toward goals - comment (pt is self limiting and requires a lot of encouragement to participate in therapy session)     Plan  Discharge plan remains appropriate;Frequency needs to be updated    Co-evaluation    PT/OT/SLP Co-Evaluation/Treatment: Yes Reason for Co-Treatment: To address functional/ADL transfers   OT goals addressed during session: Strengthening/ROM;Proper use of Adaptive equipment and DME      AM-PAC OT "6 Clicks" Daily Activity     Outcome Measure   Help from another person eating meals?: Total (TPN/NPO) Help from another person taking care of personal grooming?: A Little Help from another person toileting, which includes using toliet, bedpan, or urinal?: A Little Help from another person bathing (including washing, rinsing, drying)?: A Lot Help from another person to put on and taking off regular upper body clothing?: A Little Help from another person to put on and taking off regular lower body clothing?: A Lot 6 Click Score: 14    End of Session Equipment Utilized During Treatment: Rolling walker (2 wheels)  OT Visit Diagnosis: Unsteadiness on feet (R26.81);Muscle weakness (generalized) (M62.81)   Activity Tolerance Patient limited by fatigue   Patient Left in chair;with call bell/phone within reach;with chair alarm set   Nurse Communication Mobility status        Time: 1791-5056 OT Time Calculation (min): 16 min  Charges: OT General Charges $OT Visit: 1 Visit  Patrick [name], OTR/L,CBIS  Supplemental OT - [place] and [facility] Secure Chat Preferred    Patrick [name], Clarene Duke 02/17/2022, 4:23 PM

## 2022-02-18 ENCOUNTER — Inpatient Hospital Stay (HOSPITAL_COMMUNITY): Payer: BC Managed Care – PPO

## 2022-02-18 DIAGNOSIS — R609 Edema, unspecified: Secondary | ICD-10-CM

## 2022-02-18 DIAGNOSIS — R1900 Intra-abdominal and pelvic swelling, mass and lump, unspecified site: Secondary | ICD-10-CM | POA: Diagnosis not present

## 2022-02-18 DIAGNOSIS — M7989 Other specified soft tissue disorders: Secondary | ICD-10-CM | POA: Diagnosis not present

## 2022-02-18 LAB — TYPE AND SCREEN
ABO/RH(D): O POS
Antibody Screen: NEGATIVE
Unit division: 0
Unit division: 0

## 2022-02-18 LAB — PHOSPHORUS: Phosphorus: 4.7 mg/dL — ABNORMAL HIGH (ref 2.5–4.6)

## 2022-02-18 LAB — CBC
HCT: 24.9 % — ABNORMAL LOW (ref 39.0–52.0)
Hemoglobin: 8 g/dL — ABNORMAL LOW (ref 13.0–17.0)
MCH: 25 pg — ABNORMAL LOW (ref 26.0–34.0)
MCHC: 32.1 g/dL (ref 30.0–36.0)
MCV: 77.8 fL — ABNORMAL LOW (ref 80.0–100.0)
Platelets: 411 10*3/uL — ABNORMAL HIGH (ref 150–400)
RBC: 3.2 MIL/uL — ABNORMAL LOW (ref 4.22–5.81)
RDW: 17.9 % — ABNORMAL HIGH (ref 11.5–15.5)
WBC: 18.6 10*3/uL — ABNORMAL HIGH (ref 4.0–10.5)
nRBC: 0 % (ref 0.0–0.2)

## 2022-02-18 LAB — GLUCOSE, CAPILLARY
Glucose-Capillary: 133 mg/dL — ABNORMAL HIGH (ref 70–99)
Glucose-Capillary: 133 mg/dL — ABNORMAL HIGH (ref 70–99)
Glucose-Capillary: 137 mg/dL — ABNORMAL HIGH (ref 70–99)
Glucose-Capillary: 139 mg/dL — ABNORMAL HIGH (ref 70–99)
Glucose-Capillary: 166 mg/dL — ABNORMAL HIGH (ref 70–99)
Glucose-Capillary: 220 mg/dL — ABNORMAL HIGH (ref 70–99)

## 2022-02-18 LAB — BPAM RBC
Blood Product Expiration Date: 202403052359
Blood Product Expiration Date: 202403052359
ISSUE DATE / TIME: 202401310844
ISSUE DATE / TIME: 202401310844
Unit Type and Rh: 5100
Unit Type and Rh: 5100

## 2022-02-18 LAB — COMPREHENSIVE METABOLIC PANEL
ALT: 19 U/L (ref 0–44)
AST: 36 U/L (ref 15–41)
Albumin: 2.1 g/dL — ABNORMAL LOW (ref 3.5–5.0)
Alkaline Phosphatase: 89 U/L (ref 38–126)
Anion gap: 10 (ref 5–15)
BUN: 42 mg/dL — ABNORMAL HIGH (ref 6–20)
CO2: 29 mmol/L (ref 22–32)
Calcium: 8 mg/dL — ABNORMAL LOW (ref 8.9–10.3)
Chloride: 100 mmol/L (ref 98–111)
Creatinine, Ser: 2.21 mg/dL — ABNORMAL HIGH (ref 0.61–1.24)
GFR, Estimated: 35 mL/min — ABNORMAL LOW (ref >60)
Glucose, Bld: 130 mg/dL — ABNORMAL HIGH (ref 70–99)
Potassium: 4 mmol/L (ref 3.5–5.1)
Sodium: 139 mmol/L (ref 135–145)
Total Bilirubin: 6 mg/dL — ABNORMAL HIGH (ref 0.3–1.2)
Total Protein: 5.3 g/dL — ABNORMAL LOW (ref 6.5–8.1)

## 2022-02-18 LAB — TRIGLYCERIDES: Triglycerides: 201 mg/dL — ABNORMAL HIGH (ref <150)

## 2022-02-18 LAB — MAGNESIUM: Magnesium: 1.9 mg/dL (ref 1.7–2.4)

## 2022-02-18 LAB — SODIUM, URINE, RANDOM: Sodium, Ur: 102 mmol/L

## 2022-02-18 MED ORDER — MAGNESIUM SULFATE 2 GM/50ML IV SOLN
2.0000 g | Freq: Once | INTRAVENOUS | Status: AC
  Administered 2022-02-18: 2 g via INTRAVENOUS
  Filled 2022-02-18: qty 50

## 2022-02-18 MED ORDER — SIMETHICONE 40 MG/0.6ML PO SUSP
40.0000 mg | Freq: Four times a day (QID) | ORAL | Status: DC | PRN
  Administered 2022-02-18: 40 mg via ORAL
  Filled 2022-02-18 (×3): qty 0.6

## 2022-02-18 MED ORDER — ZINC CHLORIDE 1 MG/ML IV SOLN
INTRAVENOUS | Status: AC
  Filled 2022-02-18: qty 883.2

## 2022-02-18 NOTE — Progress Notes (Signed)
PHARMACY - TOTAL PARENTERAL NUTRITION CONSULT NOTE  Indication: Prolonged ileus  Patient Measurements: Height: 6' (182.9 cm) Weight: 116.4 kg (256 lb 9.9 oz) IBW/kg (Calculated) : 77.6 TPN AdjBW (KG): 86.6 Body mass index is 34.8 kg/m.  Assessment:  10 YOM presented with abdominal pain for 4 weeks, found to have retroperitoneal mass with necrotic jejunum, perforated fecal contamination and abscess. 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 Patient has been NPO for 4 days since admission. Patient/wife report he wasn't eating well for 4 weeks PTA and basically on a liquid diet. He endorses a ~10 lb weight loss during this time. Previously, he typically ate 2 meals a day (chicken, vegetables, beans and fruits) and 1 snack. Pharmacy consulted to dose TPN.  Glucose / Insulin: hx DM (A1C 6.6) on glipizide PTA. CBGs < 180 Received 18 units SSI in past 24 hrs, 50 units in TPN Electrolytes: all WNL except elevated Phos at 4.7 and Mag at 1.9 (goal >/= 2) Renal: AKI - SCr 2.21 (BL 1), BUN up to 42 S/p Lasix '20mg'$  IV daily 1/29-1/30, Lasix '60mg'$  IV then infusion at 8 mg/hr on 1/31, increased to 10 mg/hr on 2/1 Hepatic: LFTs WNL, tbili down to 6, TG up to 201, albumin 2.1 S/p thiamine x 3 days (1/26>1/28) Intake / Output; MIVF: UOP 1.13m/kg/hr, drain 2954m colostomy 48m37mnet +12L (down) GI Imaging:  1/23 CT abd pelvis - Marked severity colitis with adjacent abscess  1/29 CT abd pelvis - suspicious for abscesses and PNA, small pleural effusion, anasarca GI Surgeries / Procedures:  1/24 s/p ex lap with resection of transverse colon, drainage of mesenteric abscess, resection of proximal jejunum with SB anastomosis and diverting R colostomy and long Hartman's pouch 1/30 IR per drain placement  Central access: PICC 02/12/22 TPN start date:  02/12/22  Nutritional Goals:  RD Estimated Needs Total Energy Estimated Needs: 2300-2500 Total Protein Estimated Needs: 120-135 grams Total Fluid Estimated Needs: >2 L/day   Current Nutrition:  TPN Back to CLD post-procedure 1/30 (tolerated 1/29)  Plan:  Concentrate TPN per MD request TPN at goal rate 80 ml/hr to provide 132g AA, 355g CHO and 2324 kCal, meeting 100% of needs Electrolytes in TPN: Na 1248m8m, K 548mE73m Ca 3mEq/66mMg 48mEq/L2meduce Phos to 10mmol/47ml:Ac 1:2 - adjusted for reduced TPN rate as well Add standard MVI to TPN Remove standard trace elements with Tbili >6. Add back zinc '5mg'$  to TPN bag. Remove selenium/chromium for now with worsening AKI. Continue moderate SSI Q4H and 50 units insulin in TPN bag Mag sulfate 2gm IV x 1 with diuresis  Monitor TPN labs daily until stable, then standard on Mon/Thurs.  Monitor TG (ILE 25% of total kCal).  Jenika Chiem D. Jden Want, PhMina Marble, BCPS, BCCCP 2/[place]4, 10:12 AM

## 2022-02-18 NOTE — Progress Notes (Signed)
[facility] Progress Note    52 y.o. male DM, diabetic foot infections including osteomyelitis b/l feet w/ multiple debridements, nephrolithiasis, morbid obesity, CASHD s/p CABG x5, recently received contrast at [facility] 02/01/2022 with CT showing small bowel ischemia with pneumatosis and pneumoperitoneum, repeat CT (without contrast) showed severe colitis as well and possible acute pyelonephritis + pelvic free fluid. Exlap showed a retroperitoneal mass invading into the transverse colon, proximal small bowel with ischemic proximal jejunum with perforation and abscess. Patient had resection of the transverse colon, drainage of the abscess, resection of proximal jejunum with small anastomosis and diverting right colostomy.    Assessment/ Plan:   Renal failure in the setting of surgery for a retroperitoneal mass invading into the transverse colon with no significant drop in MAP during the operation. He is +15L since being hospitalized but fortunately has good UOP.  FeNA less than 1% which can be consistent with heart failure and CRS; he doesn't appear to be hypovolemic. - On Lasix gtt with great response; still massively overloaded and continued Lasix gtt is fine. Likely will need to taper off in the next 48hrs. - Fortunately no absolute indications for RRT and his UOP has been very brisk w/ the lasix gtt.   -Monitor Daily I/Os, Daily weight  -Maintain MAP>65 for optimal renal perfusion.  - Dose all meds for creatinine clearance < 30 ml/min  - Unless absolutely necessary, no MRIs with gadolinium.  - Prefer needle sticks in the dorsum of the hands or wrists.  No blood pressure measurements in right arm. - Preferred narcotic agents for pain control are hydromorphone, fentanyl, and methadone. Morphine should not be used. Avoid Baclofen and avoid oral sodium phosphate and magnesium citrate based laxatives / bowel preps. Continue strict Input and Output monitoring. Will monitor the patient  closely with you and intervene or adjust therapy as indicated by changes in clinical status/labs      Retroperitoneal mass s/p resection, drainage of abscess as well as CT guided drain of a LUQ abscess on 1/30. Patient currently receiving TPN.  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 TTE shows grade 2 diastolic dysfunction, LVEF 50% w/ global hypokinesis.  DM per primary currently on ISS  Subjective:   Abdominal pain and nonproductive cough but denies SOB/ fevers.   Objective:   BP (!) 106/57   Pulse (!) 104   Temp 99.3 F (37.4 C) (Axillary)   Resp 18   Ht 6' (1.829 m)   Wt 116.4 kg   SpO2 95%   BMI 34.80 kg/m   Intake/Output Summary (Last 24 hours) at 02/18/2022 1156 Last data filed at 02/18/2022 1100 Gross per 24 hour  Intake 3257.52 ml  Output 6545 ml  Net -3287.48 ml   Weight change:   Physical Exam: General appearance: alert and oriented x3 Head: NCAT Resp: CTA no rales Cardio: RRR GI: no BS, drains in place Extremities: edema 1+, b/l ray amputations Pulses: 2+ and symmetric  Imaging: VAS [place] LOWER EXTREMITY VENOUS (DVT)  Result Date: 02/18/2022  Lower Venous DVT Study Patient Name:  [place]  Date of Exam:   02/18/2022 Medical Rec #: [id number]     Accession #:    [id number] Date of Birth: [birth year]    Patient Gender: M Patient Age:   83 years Exam Location:  [facility] Procedure:      VAS [place] LOWER EXTREMITY VENOUS (DVT) Referring Phys: Terrilee Croak --------------------------------------------------------------------------------  Indications: Swelling, and Edema.  Comparison Study: no  prior Performing Technologist: Archie Patten RVS  Examination Guidelines: A complete evaluation includes B-mode imaging, spectral Doppler, color Doppler, and power Doppler as needed of all accessible portions of each vessel. Bilateral testing is considered an integral part of a complete examination. Limited examinations for reoccurring indications may be performed as noted.  The reflux portion of the exam is performed with the patient in reverse Trendelenburg.  +---------+---------------+---------+-----------+----------+-------------------+ RIGHT    CompressibilityPhasicitySpontaneityPropertiesThrombus Aging      +---------+---------------+---------+-----------+----------+-------------------+ CFV      Full           Yes      Yes                                      +---------+---------------+---------+-----------+----------+-------------------+ SFJ      Full                                                             +---------+---------------+---------+-----------+----------+-------------------+ FV Prox  Full                                                             +---------+---------------+---------+-----------+----------+-------------------+ FV Mid   Full                                                             +---------+---------------+---------+-----------+----------+-------------------+ FV DistalFull                                                             +---------+---------------+---------+-----------+----------+-------------------+ PFV      Full                                                             +---------+---------------+---------+-----------+----------+-------------------+ POP      Full           Yes      Yes                                      +---------+---------------+---------+-----------+----------+-------------------+ PTV      Full                                                             +---------+---------------+---------+-----------+----------+-------------------+  PERO                                                  Not well visualized +---------+---------------+---------+-----------+----------+-------------------+   +---------+---------------+---------+-----------+----------+-------------------+ LEFT     CompressibilityPhasicitySpontaneityPropertiesThrombus Aging       +---------+---------------+---------+-----------+----------+-------------------+ CFV      Full           Yes      Yes                                      +---------+---------------+---------+-----------+----------+-------------------+ SFJ      Full                                                             +---------+---------------+---------+-----------+----------+-------------------+ FV Prox  Full                                                             +---------+---------------+---------+-----------+----------+-------------------+ FV Mid   Full                                                             +---------+---------------+---------+-----------+----------+-------------------+ FV DistalFull                                                             +---------+---------------+---------+-----------+----------+-------------------+ PFV      Full                                                             +---------+---------------+---------+-----------+----------+-------------------+ POP      Full           Yes      Yes                                      +---------+---------------+---------+-----------+----------+-------------------+ PTV      Full                                                             +---------+---------------+---------+-----------+----------+-------------------+ PERO  Not well visualized +---------+---------------+---------+-----------+----------+-------------------+     Summary: BILATERAL: - No evidence of deep vein thrombosis seen in the lower extremities, bilaterally. -No evidence of popliteal cyst, bilaterally.   *See table(s) above for measurements and observations.    Preliminary    DG CHEST PORT 1 VIEW  Result Date: 02/17/2022 CLINICAL DATA:  Hypoxia. EXAM: PORTABLE CHEST 1 VIEW COMPARISON:  February 15, 2022. FINDINGS: Stable cardiomegaly. Status post  coronary bypass graft. Stable probable right basilar subsegmental atelectasis. Stable left basilar atelectasis or infiltrate is noted with associated pleural effusion. Bony thorax is unremarkable. IMPRESSION: Stable bilateral lung opacities as noted above. Electronically Signed   By: Marijo Conception M.D.   On: 02/17/2022 11:57   CT GUIDED VISCERAL FLUID DRAIN BY PERC CATH  Result Date: 02/16/2022 INDICATION: 52 year old male abdominal abscess referred for drainage EXAM: CT GUIDED DRAINAGE OF  ABSCESS MEDICATIONS: The patient is currently admitted to the hospital and receiving intravenous antibiotics. The antibiotics were administered within an appropriate time frame prior to the initiation of the procedure. ANESTHESIA/SEDATION: 0.5 mg IV Versed 0 mcg IV Fentanyl Moderate Sedation Time:  0 minutes The patient was continuously monitored during the procedure by the interventional radiology nurse under my direct supervision. COMPLICATIONS: None TECHNIQUE: Informed written consent was obtained from the patient after a thorough discussion of the procedural risks, benefits and alternatives. All questions were addressed. Maximal Sterile Barrier Technique was utilized including caps, mask, sterile gowns, sterile gloves, sterile drape, hand hygiene and skin antiseptic. A timeout was performed prior to the initiation of the procedure. PROCEDURE: The operative field was prepped with Chlorhexidine in a sterile fashion, and a sterile drape was applied covering the operative field. A sterile gown and sterile gloves were used for the procedure. Local anesthesia was provided with 1% Lidocaine. Patient was positioned supine on the CT gantry table. Scout CT acquired for planning purposes. Patient is prepped and draped in the usual sterile fashion. 1% lidocaine was used for local anesthesia. Using CT guidance and modified Seldinger technique, a 12 French drain was placed into the fluid and gas collection of the left upper quadrant.  Once the drain was in position, proximally 350 cc of frankly purulent material was evacuated. A gravity drain was attached given the presumed bowel injury/tumor. Drain was sutured in position. Patient tolerated the procedure well and remained hemodynamically stable throughout. No complications were encountered and no significant blood loss. FINDINGS: Planning CT demonstrates fluid and gas collection of the left upper quadrant. 350 cc of frankly purulent material aspirated. Final CT demonstrates adequate positioning of the drain with partial decompression of the cavity. IMPRESSION: Status post CT-guided drainage of left upper quadrant abscess. Signed, Dulcy Fanny. Nadene Rubins, RPVI [facility] Radiology Electronically Signed   By: Corrie Mckusick D.O.   On: 02/16/2022 12:47    Labs: BMET Recent Labs  Lab 02/12/22 0240 02/13/22 0500 02/13/22 0856 02/14/22 0445 02/15/22 0441 02/16/22 0520 02/17/22 0542 02/17/22 1151 02/18/22 0516  NA 139 133* 134* 138 134* 134* 136 138 139  K 3.7 5.5* 3.7 3.8 3.8 4.2 4.2 4.3 4.0  CL 105 102 102 104 101 101 101  --  100  CO2 23 21* '23 22 25 26 22  '$ --  29  GLUCOSE 107* 435* 150* 180* 210* 209* 192*  --  130*  BUN '20 14 15 16 '$ 24* 36* 41*  --  42*  CREATININE 1.06 0.92 1.05 1.18 1.51* 2.02* 2.25*  --  2.21*  CALCIUM 7.5* 7.4* 7.3* 7.3* 7.3* 7.3* 7.5*  --  8.0*  PHOS 2.9 4.0 2.5 3.0 2.9  --  3.7  --  4.7*   CBC Recent Labs  Lab 02/15/22 0441 02/16/22 0520 02/17/22 0542 02/17/22 1151 02/18/22 0516  WBC 16.1* 16.3* 16.9*  --  18.6*  HGB 7.8* 7.4* 6.7* 8.8* 8.0*  HCT 25.2* 24.8* 21.4* 26.0* 24.9*  MCV 79.7* 78.7* 78.4*  --  77.8*  PLT 378 389 358  --  411*    Medications:     alteplase  2 mg Intracatheter Once   Chlorhexidine Gluconate Cloth  6 each Topical Daily   enoxaparin (LOVENOX) injection  55 mg Subcutaneous Q24H   insulin aspart  0-15 Units Subcutaneous Q4H   sodium chloride flush  10-40 mL  Intracatheter Q12H   sodium chloride flush  5 mL Intracatheter Q8H      Otelia Santee, MD 02/18/2022, 11:56 AM

## 2022-02-18 NOTE — Consult Note (Signed)
[facility] Nurse ostomy follow up Stoma type/location:  RUQ  Stomal assessment/size: 2" x 1 1/4", oval, skin level, mucocutaneous separation noted circumferential, most significant area at 8 o'clock 0.3 cms x 0.5 cms  filled with Alginate  Peristomal assessment: erythema circumferential extending approximately 0.2 cms  Treatment options for stomal/peristomal skin: filled largest area of mucocutaneous separation with alginate, crusted surrounding skin with ostomy powder and skin prep then utilized 2" barrier ring Output 20 mls dark brown liquid  Ostomy pouching: 1pc. 2 1/8" convex Kellie Simmering [phone])  Education provided:  Wife and mother present for education.  Wife demonstrates emptying of pouch utilizing lock and roll mechanism, cleans spout with toilet paper wick and closes pouch.  Wife uses push/pull technique to remove old pouch, cleans around stoma with water moistened washcloth.  This RN did demonstrate crusting of skin around stoma.  Wife applied barrier ring around stoma and cut new skin barrier.  Wife able to place new pouch on barrier ring and close pouch.    Wife feels confident in changing ostomy appliance.    Enrolled patient in [place] Start Discharge program: Yes  Additional supplies ordered for bedside, ostomy powder and alginate at bedside.   WOC will continue to follow patient and family for ostomy support and education.   Thank you,    Yanelli Zapanta MSN, RN-BC, [employer]

## 2022-02-18 NOTE — Progress Notes (Addendum)
Patient ID: Patrick [name], male   DOB: 14-Jul-1970, 52 y.o.   MRN: [id number] 8 Days Post-Op    Subjective: Passing gas in ostomy.  No nausea.  Really wanting some liquids, but unable while on 10L O2.  Sats around 93-94% today.  Voided almost 5L of urine yesterday after starting lasix gtt.  Hasn't had to start levo.  Pressures improving.  MAP 80 this am.  Tmax only 100.3 today   Objective: Vital signs in last 24 hours: Temp:  [98.2 F (36.8 C)-100.3 F (37.9 C)] 99.3 F (37.4 C) (02/01 0400) Pulse Rate:  [93-114] 103 (02/01 0700) Resp:  [11-31] 15 (02/01 0700) BP: (96-130)/(54-77) 109/66 (02/01 0700) SpO2:  [88 %-99 %] 94 % (02/01 0700) Weight:  [116.4 kg] 116.4 kg (02/01 0500) Last BM Date :  (pta)  Intake/Output from previous day: 01/31 0701 - 02/01 0700 In: 3469.7 [P.O.:180; I.V.:2333.3; Blood:663.7; IV Piggyback:292.7] Out: 5495 [Urine:5200; Drains:290; Stool:5] Intake/Output this shift: Total I/O In: -  Out: 250 [Urine:250]  Gen: NAD, but quite fatigued Heart: regular, but tachy in 100 Lungs: decrease BS bilaterally, 10L [place], sats around 93-94% Abd: soft, wound with increase in tan drainage still with base of the wound with fibrin, edges are clean. Colostomy is viable with some liquid output and air.  Surgical drain bright yellow serous fluid.  IR/LUQ drain with tan cloudy output, query enteric contents.  GU: foley in place with much clearer yellow urine today Ext: BLE edema Skin: some jaundice and diffuse anasarca Psych: A&Ox3  Lab Results: CBC  Recent Labs    02/17/22 0542 02/17/22 1151 02/18/22 0516  WBC 16.9*  --  18.6*  HGB 6.7* 8.8* 8.0*  HCT 21.4* 26.0* 24.9*  PLT 358  --  411*   BMET Recent Labs    02/17/22 0542 02/17/22 1151 02/18/22 0516  NA 136 138 139  K 4.2 4.3 4.0  CL 101  --  100  CO2 22  --  29  GLUCOSE 192*  --  130*  BUN 41*  --  42*  CREATININE 2.25*  --  2.21*  CALCIUM 7.5*  --  8.0*   PT/INR Recent Labs    02/16/22 0631   LABPROT 16.7*  INR 1.4*   ABG Recent Labs    02/17/22 1151  PHART 7.428  HCO3 26.0    Studies/Results: DG CHEST PORT 1 VIEW  Result Date: 02/17/2022 CLINICAL DATA:  Hypoxia. EXAM: PORTABLE CHEST 1 VIEW COMPARISON:  February 15, 2022. FINDINGS: Stable cardiomegaly. Status post coronary bypass graft. Stable probable right basilar subsegmental atelectasis. Stable left basilar atelectasis or infiltrate is noted with associated pleural effusion. Bony thorax is unremarkable. IMPRESSION: Stable bilateral lung opacities as noted above. Electronically Signed   By: Marijo Conception M.D.   On: 02/17/2022 11:57   CT GUIDED VISCERAL FLUID DRAIN BY PERC CATH  Result Date: 02/16/2022 INDICATION: 52 year old male abdominal abscess referred for drainage EXAM: CT GUIDED DRAINAGE OF  ABSCESS MEDICATIONS: The patient is currently admitted to the hospital and receiving intravenous antibiotics. The antibiotics were administered within an appropriate time frame prior to the initiation of the procedure. ANESTHESIA/SEDATION: 0.5 mg IV Versed 0 mcg IV Fentanyl Moderate Sedation Time:  0 minutes The patient was continuously monitored during the procedure by the interventional radiology nurse under my direct supervision. COMPLICATIONS: None TECHNIQUE: Informed written consent was obtained from the patient after a thorough discussion of the procedural risks, benefits and alternatives. All questions were addressed. Maximal Sterile Barrier  Technique was utilized including caps, mask, sterile gowns, sterile gloves, sterile drape, hand hygiene and skin antiseptic. A timeout was performed prior to the initiation of the procedure. PROCEDURE: The operative field was prepped with Chlorhexidine in a sterile fashion, and a sterile drape was applied covering the operative field. A sterile gown and sterile gloves were used for the procedure. Local anesthesia was provided with 1% Lidocaine. Patient was positioned supine on the CT gantry  table. Scout CT acquired for planning purposes. Patient is prepped and draped in the usual sterile fashion. 1% lidocaine was used for local anesthesia. Using CT guidance and modified Seldinger technique, a 12 French drain was placed into the fluid and gas collection of the left upper quadrant. Once the drain was in position, proximally 350 cc of frankly purulent material was evacuated. A gravity drain was attached given the presumed bowel injury/tumor. Drain was sutured in position. Patient tolerated the procedure well and remained hemodynamically stable throughout. No complications were encountered and no significant blood loss. FINDINGS: Planning CT demonstrates fluid and gas collection of the left upper quadrant. 350 cc of frankly purulent material aspirated. Final CT demonstrates adequate positioning of the drain with partial decompression of the cavity. IMPRESSION: Status post CT-guided drainage of left upper quadrant abscess. Signed, Dulcy Fanny. Nadene Rubins, RPVI [facility] Radiology Electronically Signed   By: Corrie Mckusick D.O.   On: 02/16/2022 12:47    Anti-infectives: Anti-infectives (From admission, onward)    Start     Dose/Rate Route Frequency Ordered Stop   02/17/22 1200  piperacillin-tazobactam (ZOSYN) IVPB 3.375 g        3.375 g 12.5 mL/hr over 240 Minutes Intravenous Every 8 hours 02/17/22 1103     02/15/22 1500  piperacillin-tazobactam (ZOSYN) IVPB 3.375 g        3.375 g 12.5 mL/hr over 240 Minutes Intravenous Every 8 hours 02/15/22 0836 02/17/22 0217   02/05/2022 1100  piperacillin-tazobactam (ZOSYN) IVPB 3.375 g        3.375 g 100 mL/hr over 30 Minutes Intravenous  Once 02/02/2022 1056 01/21/2022 1230   01/29/2022 0200  piperacillin-tazobactam (ZOSYN) IVPB 3.375 g  Status:  Discontinued        3.375 g 12.5 mL/hr over 240 Minutes Intravenous Every 8 hours 01/21/2022 2322 02/15/22 0836   01/21/2022 1900  piperacillin-tazobactam (ZOSYN) IVPB  3.375 g        3.375 g 100 mL/hr over 30 Minutes Intravenous  Once 02/02/2022 1857 01/25/2022 2012       Assessment/Plan: 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  WBC up to 18K today though, monitor - TNA - JP surgical serous today, IR drain more suspicious for leak.  Monitor closely - dressing changes to midline wound to help debride some slough and combat drainage, TID - WOC following for new ostomy - cont zosyn - surgical path is pending  -PT/OT evals -CBC and CMET daily  AKI - stable at 2.21.  made great urine yesterday with lasix gtt.  Appreciate nephrology evaluation and assistance.  Continue to follow their recs Fluid overload/hypoalbuminemia - making some improvements  in volume status, but not much change to hypoxia just yet.  Cont full rate TNA, but concentrate this.  Echo reviewed and EF preserved but with some hypokinesis.   LLL PNA - pulm toilet. IS.  Noted on CT.  On zosyn already which should cover this for now.   CAD, s/p CABG - no chest pain, echo as above DM - SSI Hyperbilirubinemia - unclear etiology.  6.0 today  Fractionate out his TB suggests more obstructive patter with elevated direct bilirubin.  Gallstones noted on CT, but no ductal dilatation.  Other LFTs essentially normal.  Continue to monitor.  Hep panel negative. Chronic osteomyelitis of left foot - s/p amputation ABL anemia - 2 units prbc given 1/31, hgb 8 today.   monitor  This patient's labs, imaging, vitals, I/O, chart have all been reviewed along with notes from medical  team, nephrology, IR, and nursing.      FEN: NPO/TNA (concentrate this)/lasix gtt VTE: Lovenox ID: Zosyn 1/23>>      LOS: 9 days    Henreitta Cea PA-C 8:16 AM 02/18/2022  Use [email or web address] to contact on call provider  02/18/2022

## 2022-02-18 NOTE — Progress Notes (Signed)
PROGRESS NOTE  Patrick [name]  DOB: 1970/08/08  PCP: Caryl Bis, MD WYO:[id number]  DOA: 02/06/2022  LOS: 9 days  Hospital Day: 10  Brief narrative: Patrick [name] is a 52 y.o. male with PMH significant for DM2, HTN, HLD, CAD/CABG, diabetic neuropathy, chronic osteomyelitis of both feet.  Patient has had abdominal pain for 4 weeks which increased in severity for about a week after which she went to see his PCP.  CT scan of abdomen was obtained on 1/23 which showed extensive small bowel ischemia with pneumatosis in the left upper quadrant with surrounding pulmonary changes and probable abscess and pneumoperitoneum.  Patient was hence directed to the ED on 1/23.  In the ED, CT scan was repeated which showed marked severity colitis involving the distal transverse colon with a large abscess versus mass with markedly inflamed, likely ischemic partially obstructed proximal small bowel loops.  Also showed cholelithiasis, possible acute pyelonephritis. Patient was admitted to general surgery service.  1/29, hospital service was consulted for comanagement of medical issues. See below for details.   Subjective: Patient was seen and examined this morning.   Lying on bed.  Not in distress.  Remains on supplemental oxygen.  Wife at bedside. Ostomy bag with liquid stool.  TPN ongoing. On Lasix drip per nephrology.  Assessment and plan: Retroperitoneal mass with invasion of the transverse colon with proximal small bowel with ischemic necrotic proximal jejunum with perforation with fecal contamination with abscess 1/24, patient underwent expiratory laparotomy.  He was found to have a retroperitoneal mass with invasion of the transverse colon, proximal small bowel with ischemic necrotic proximal jejunum and perforation fecal contamination with abscess.  He underwent resection of the transverse colon, drainage of mesenteric abscess, resection of proximal jejunum with small bowel anastomosis and diverting  right colostomy and long Hartmann pouch. Postsurgically, patient had postop ileus.  He was started on TPN 1/29, repeat CT abdomen showed a large intra-abdominal abscess adjacent to the transverse colonic suture line 1/30, patient underwent CT-guided drainage of left upper quadrant abscess by IR. On TPN present for surgery. General surgery on board Pending surgical pathology report.  Sepsis - not POA Postsurgically, patient had fever up to 101 with tachycardia, leukocytosis CT abdomen showed intra-abdominal abscesses.  It also showed possible bilateral pneumonia. No growth in blood cultures so far Currently on empiric coverage with IV Zosyn. No fever in the last 24 hours.  WBC count still remains elevated and is rising Defer to general surgery for the duration of antibiotic treatment Recent Labs  Lab 02/14/22 0445 02/15/22 0441 02/16/22 0520 02/17/22 0542 02/18/22 0516  WBC 16.6* 16.1* 16.3* 16.9* 18.6*  PROCALCITON  --  2.93  --   --   --      Acute blood loss anemia Hemoglobin has been running low this admission 1/31, it was 6.7.  2 units of PRBC transfusion given with improvement to 8.8 but dropped again to 8 this morning. Recent Labs    02/15/22 0441 02/16/22 0520 02/17/22 0542 02/17/22 1151 02/18/22 0516  HGB 7.8* 7.4* 6.7* 8.8* 8.0*  MCV 79.7* 78.7* 78.4*  --  77.8*    Acute kidney injury Mild systolic CHF Creatinine normal at baseline.  Presented with creatinine up 1.57, fluctuated postoperatively.  Steadily rising creatinine in the last 48 hours.  2.25 today.  Blood pressures running low normal. Heart rate elevated.   Echocardiogram 1/29 showed LVEF 50% with global hypokinesis, grade 2 diastolic dysfunction Mild systolic dysfunction likely due to  acute illness. Probably has low effective arterial volume due to third spacing Intermittently given IV albumin Currently on TPN Nephrology has been consulted.  Currently on Lasix drip. Recent Labs    02/15/2022 0535  02/11/22 0432 02/12/22 0240 02/13/22 0500 02/13/22 0856 02/14/22 0445 02/15/22 0441 02/16/22 0520 02/17/22 0542 02/18/22 0516  BUN '19 19 20 14 15 16 '$ 24* 36* 41* 42*  CREATININE 1.40* 1.28* 1.06 0.92 1.05 1.18 1.51* 2.02* 2.25* 2.21*    Acute respiratory with hypoxia Worsening oxygen requirement.  On 5 L this morning. As discussed above, worsening oxygen requirement is multifactorial- secondary to combination of atelectasis as well as bilateral pneumonia, worsening anemia and mild systolic CHF. Wean down as tolerated.  Hyperbilirubinemia Cholelithiasis LFTs trend as below.  Noted rising both total and direct bilirubin without rising AST, ALT or alk phos.  Unclear etiology. CT abdomen showed cholelithiasis without choledocholithiasis or CBD dilatation. Bilirubin level improving. Continue to monitor Recent Labs  Lab 02/13/22 0500 02/14/22 0445 02/15/22 0441 02/16/22 0520 02/16/22 0631 02/17/22 0542 02/18/22 0516  AST 20  --  34  33  --  50* 41 36  ALT 11  --  18  16  --  '20 19 19  '$ ALKPHOS 49  --  75  75  --  78 67 89  BILITOT 3.4*  --  5.9*  6.2*  --  6.8* 6.4* 6.0*  BILIDIR  --   --  4.1*  --  4.7*  --   --   IBILI  --   --  1.8*  --  2.1*  --   --   PROT 4.0*  --  4.4*  4.5*  --  4.6* 5.0* 5.3*  ALBUMIN 1.7*  --  1.6*  1.6*  --  1.5* 2.1* 2.1*  INR  --   --   --   --  1.4*  --   --   PLT 424* 459* 378 389  --  358 411*    Type 2 diabetes mellitus A1c 6.6 on 01/23/2022 PTA on glipizide 5 mg twice daily Currently on sliding scale insulin only Recent Labs  Lab 02/17/22 1945 02/17/22 2351 02/18/22 0349 02/18/22 0809 02/18/22 1129  GLUCAP 173* 160* 139* 133* 133*    Acute hypoxic respiratory failure Secondary to atelectasis.   Continue supplemental oxygen  Encourage use of incentive spirometry and OOB   CAD/CABG PTA on aspirin 81 mg daily, Lipitor 20 mg daily Currently on hold  Chronic osteomyelitis of both feet S/p ray amputations  2020  Anxiety/depression PTA on Lexapro 20 mg daily, Currently on hold   Mobility: Needs PT evaluation once clinically improved.  Goals of care   Code Status: Full Code    DVT prophylaxis:  SCDs Start: 02/07/2022 2308   Antimicrobials: IV Zosyn Fluid: TPN Family Communication: Wife at bedside  Continue in-hospital care because: Not medically stable for discharge yet.   Scheduled Meds:  alteplase  2 mg Intracatheter Once   Chlorhexidine Gluconate Cloth  6 each Topical Daily   enoxaparin (LOVENOX) injection  55 mg Subcutaneous Q24H   insulin aspart  0-15 Units Subcutaneous Q4H   sodium chloride flush  10-40 mL Intracatheter Q12H   sodium chloride flush  5 mL Intracatheter Q8H    PRN meds: acetaminophen, HYDROmorphone (DILAUDID) injection, methocarbamol, ondansetron (ZOFRAN) IV, mouth rinse, oxyCODONE, prochlorperazine, simethicone, sodium chloride flush   Infusions:   sodium chloride Stopped (01/30/2022 1602)   furosemide (LASIX) 200 mg in dextrose 5 % 100  mL (2 mg/mL) infusion 10 mg/hr (02/18/22 1200)   norepinephrine (LEVOPHED) Adult infusion     piperacillin-tazobactam (ZOSYN)  IV 3.375 g (02/18/22 1206)   TPN ADULT (ION) 100 mL/hr at 02/18/22 1200   TPN ADULT (ION)      Diet:  Diet Order             Diet clear liquid Room service appropriate? Yes; Fluid consistency: Thin  Diet effective now                   Antimicrobials: Anti-infectives (From admission, onward)    Start     Dose/Rate Route Frequency Ordered Stop   02/17/22 1200  piperacillin-tazobactam (ZOSYN) IVPB 3.375 g        3.375 g 12.5 mL/hr over 240 Minutes Intravenous Every 8 hours 02/17/22 1103     02/15/22 1500  piperacillin-tazobactam (ZOSYN) IVPB 3.375 g        3.375 g 12.5 mL/hr over 240 Minutes Intravenous Every 8 hours 02/15/22 0836 02/17/22 0217   02/13/2022 1100  piperacillin-tazobactam (ZOSYN) IVPB 3.375 g        3.375 g 100 mL/hr over 30 Minutes Intravenous  Once 01/25/2022 1056  02/01/2022 1230   02/08/2022 0200  piperacillin-tazobactam (ZOSYN) IVPB 3.375 g  Status:  Discontinued        3.375 g 12.5 mL/hr over 240 Minutes Intravenous Every 8 hours 01/29/2022 2322 02/15/22 0836   02/12/2022 1900  piperacillin-tazobactam (ZOSYN) IVPB 3.375 g        3.375 g 100 mL/hr over 30 Minutes Intravenous  Once 02/16/2022 1857 02/17/2022 2012       Skin assessment:       Nutritional status:  Body mass index is 34.8 kg/m.  Nutrition Problem: Severe Malnutrition Etiology: acute illness (mass with perforation) Signs/Symptoms: moderate fat depletion, energy intake < or equal to 50% for > or equal to 5 days     Objective: Vitals:   02/18/22 1200 02/18/22 1230  BP: (!) 107/58 (!) 102/57  Pulse: (!) 108   Resp: (!) 23   Temp: 99 F (37.2 C)   SpO2: 93%     Intake/Output Summary (Last 24 hours) at 02/18/2022 1342 Last data filed at 02/18/2022 1200 Gross per 24 hour  Intake 2820.64 ml  Output 6515 ml  Net -3694.36 ml    Filed Weights   02/14/22 0451 02/15/22 0500 02/18/22 0500  Weight: 112.9 kg 112.4 kg 116.4 kg   Weight change:  Body mass index is 34.8 kg/m.   Physical Exam: General exam: Pleasant middle-aged Caucasian male.   Not in pain Skin: No rashes, lesions or ulcers. Grossly jaundiced.  HEENT: Atraumatic, normocephalic, no obvious bleeding Lungs: Diminished air entry in both bases.  On 5 L oxygen by nasal cannula this morning. CVS: Regular rate and rhythm, no murmur GI/Abd soft, appropriate postop tenderness.  Bowel sound present.  Liquid brown stool seen in the ostomy bag CNS: Alert, awake, oriented x 3 Psychiatry: Sad affect Extremities: 1-2+ bilateral pedal edema, no calf tenderness  Data Review: I have personally reviewed the laboratory data and studies available.  F/u labs ordered Unresulted Labs (From admission, onward)     Start     Ordered   03/03/2022 3614  Basic metabolic panel  Tomorrow morning,   R       Question:  Specimen collection method   Answer:  Unit=Unit collect   02/18/22 1014   03/10/2022 0500  Magnesium  Tomorrow morning,   R  Question:  Specimen collection method  Answer:  Unit=Unit collect   02/18/22 1014   02/18/2022 0500  Phosphorus  Tomorrow morning,   R       Question:  Specimen collection method  Answer:  Unit=Unit collect   02/18/22 1014   02/18/22 0500  CBC  Daily,   R     Question:  Specimen collection method  Answer:  Unit=Unit collect   02/17/22 1103   02/18/22 0500  Comprehensive metabolic panel  Daily,   R     Question:  Specimen collection method  Answer:  Unit=Unit collect   02/17/22 1103   02/18/22 0500  Urea nitrogen, urine  Tomorrow morning,   R        02/17/22 1913   02/17/22 1103  Blood gas, arterial  ONCE - STAT,   STAT        02/17/22 1102   02/16/22 6160  Basic metabolic panel  Daily,   R     Question:  Specimen collection method  Answer:  Unit=Unit collect   02/15/22 1018   02/15/22 0500  Comprehensive metabolic panel  (TPN Lab Panel)  Every Mon,Thu (0500),   R     Question:  Specimen collection method  Answer:  Lab=Lab collect   02/12/22 1001   02/15/22 0500  Magnesium  (TPN Lab Panel)  Every Mon,Thu (0500),   R     Question:  Specimen collection method  Answer:  Lab=Lab collect   02/12/22 1001   02/15/22 0500  Phosphorus  (TPN Lab Panel)  Every Mon,Thu (0500),   R     Question:  Specimen collection method  Answer:  Lab=Lab collect   02/12/22 1001   02/15/22 0500  Triglycerides  (TPN Lab Panel)  Every Mon,Thu (0500),   R     Question:  Specimen collection method  Answer:  Lab=Lab collect   02/12/22 1001            Total time spent in review of labs and imaging, patient evaluation, formulation of plan, documentation and communication with family: 87 minutes  Signed, Terrilee Croak, MD [facility] 02/18/2022

## 2022-02-18 NOTE — Progress Notes (Signed)
Referring Physician(s):Lovick, Marval Regal, MD  Supervising Physician: Ruthann Cancer  Patient Status:  [facility] - In-pt  Chief Complaint: Follow up LUQ drain placed 02/16/22 in IR (Dr. Earleen Newport)  Subjective:  Patient sleeping, arouses easily - denies complaints currently, besides being tired.  Allergies: Morphine  Medications: Prior to Admission medications   Medication Sig Start Date End Date Taking? Authorizing Provider  APPLE CIDER VINEGAR PO Take 450 mg by mouth daily.   Yes [provider]  aspirin EC (BAYER ASPIRIN EC LOW DOSE) 81 MG tablet Take 81 mg by mouth daily.   Yes [provider]  atorvastatin (LIPITOR) 20 MG tablet Take 20 mg by mouth every evening.   Yes [provider]  escitalopram (LEXAPRO) 20 MG tablet Take 20 mg by mouth daily. 01/04/22  Yes [provider]  folic acid (FOLVITE) 1 MG tablet Take 1 tablet by mouth daily. 12/04/21  Yes [provider]  glipiZIDE (GLUCOTROL) 5 MG tablet Take 5 mg by mouth 2 (two) times daily before a meal. 05/22/20  Yes [provider]  omeprazole (PRILOSEC) 20 MG capsule Take 20 mg by mouth daily. 01/14/22  Yes [provider]     Vital Signs: BP 109/66   Pulse (!) 103   Temp 99.3 F (37.4 C) (Axillary)   Resp 15   Ht 6' (1.829 m)   Wt 256 lb 9.9 oz (116.4 kg)   SpO2 94%   BMI 34.80 kg/m   Physical Exam Vitals and nursing note reviewed.  Constitutional:      General: He is not in acute distress. HENT:     Head: Normocephalic.  Cardiovascular:     Rate and Rhythm: Tachycardia present.  Pulmonary:     Effort: Pulmonary effort is normal.  Abdominal:     Comments: (+) LUQ drain to gravity bag with ~20 cc thick yellow output present (bag recently emptied). Insertion site unremarkable.  Skin:    General: Skin is warm and dry.     Imaging: DG CHEST PORT 1 VIEW  Result Date: 02/17/2022 CLINICAL DATA:  Hypoxia. EXAM: PORTABLE CHEST 1 VIEW COMPARISON:  February 15, 2022. FINDINGS: Stable cardiomegaly. Status post coronary bypass graft. Stable probable right basilar subsegmental atelectasis. Stable left basilar atelectasis or infiltrate is noted with associated pleural effusion. Bony thorax is unremarkable. IMPRESSION: Stable bilateral lung opacities as noted above. Electronically Signed   By: Marijo Conception M.D.   On: 02/17/2022 11:57   CT GUIDED VISCERAL FLUID DRAIN BY PERC CATH  Result Date: 02/16/2022 INDICATION: 52 year old male abdominal abscess referred for drainage EXAM: CT GUIDED DRAINAGE OF  ABSCESS MEDICATIONS: The patient is currently admitted to the hospital and receiving intravenous antibiotics. The antibiotics were administered within an appropriate time frame prior to the initiation of the procedure. ANESTHESIA/SEDATION: 0.5 mg IV Versed 0 mcg IV Fentanyl Moderate Sedation Time:  0 minutes The patient was continuously monitored during the procedure by the interventional radiology nurse under my direct supervision. COMPLICATIONS: None TECHNIQUE: Informed written consent was obtained from the patient after a thorough discussion of the procedural risks, benefits and alternatives. All questions were addressed. Maximal Sterile Barrier Technique was utilized including caps, mask, sterile gowns, sterile gloves, sterile drape, hand hygiene and skin antiseptic. A timeout was performed prior to the initiation of the procedure. PROCEDURE: The operative field was prepped with Chlorhexidine in a sterile fashion, and a sterile drape was applied covering the operative field. A sterile gown and sterile gloves were used  for the procedure. Local anesthesia was provided with 1% Lidocaine. Patient was positioned supine on the CT gantry table. Scout CT acquired for planning purposes. Patient is prepped and draped in the usual sterile fashion. 1% lidocaine was used for local anesthesia. Using CT guidance and modified Seldinger technique, a 12 French drain was placed into the  fluid and gas collection of the left upper quadrant. Once the drain was in position, proximally 350 cc of frankly purulent material was evacuated. A gravity drain was attached given the presumed bowel injury/tumor. Drain was sutured in position. Patient tolerated the procedure well and remained hemodynamically stable throughout. No complications were encountered and no significant blood loss. FINDINGS: Planning CT demonstrates fluid and gas collection of the left upper quadrant. 350 cc of frankly purulent material aspirated. Final CT demonstrates adequate positioning of the drain with partial decompression of the cavity. IMPRESSION: Status post CT-guided drainage of left upper quadrant abscess. Signed, Dulcy Fanny. Nadene Rubins, RPVI [facility] Radiology Electronically Signed   By: Corrie Mckusick D.O.   On: 02/16/2022 12:47   CT ABDOMEN PELVIS WO CONTRAST  Result Date: 02/15/2022 CLINICAL DATA:  Abdominal pain, post-op s/p SBR and transverse colectomy for large retroperitoneal mass, fevers, leukocytosis. EXAM: CT ABDOMEN AND PELVIS WITHOUT CONTRAST TECHNIQUE: Multidetector CT imaging of the abdomen and pelvis was performed following the standard protocol without IV contrast. RADIATION DOSE REDUCTION: This exam was performed according to the departmental dose-optimization program which includes automated exposure control, adjustment of the mA and/or kV according to patient size and/or use of iterative reconstruction technique. COMPARISON:  CT 02/08/2022 FINDINGS: Lower chest: Bilateral lower lobe airspace disease, left greater than right. The left lower lobe pulmonary parenchyma is likely heterogeneous. Small left pleural effusion. Decreased density of the blood pool suggests anemia. Hepatobiliary: No evidence of focal hepatic abnormality on this unenhanced exam. Layering gallstones in the gallbladder which is mildly distended. No biliary dilatation. Pancreas: No  ductal dilatation or inflammation. Spleen: Subcapsular fluid collection, inferiorly containing air measures approximately 5.1 x 3.7 x 3.1 cm, series 3, image 35 and series 6, image 2018. The spleen is normal in size. Adrenals/Urinary Tract: Normal adrenal glands. No hydronephrosis. Mild bilateral perinephric edema. Minimally distended urinary bladder. Foley catheter is in place, balloon is likely in the bladder. Air in the bladder is consistent with Foley placement. Stomach/Bowel: Lack of enteric contrast limits detailed bowel assessment. Interval transverse colostomy. There is no small bowel obstruction or postoperative ileus. The appendix is tentatively visualized and normal. Surgical drain entering from the right paramidline courses into the left abdomen and terminates in the left upper quadrant anteriorly. Just distal to the drain is a large heterogeneous ill-defined air fluid collection. Exact size measurement is difficult due to the ill-defined nature, however representative measurements of 12.4 x 13.9 x 14.9 cm, series 3, image 35 and series 6, image 37. This is at the level of the transverse colonic staple line. This may be contiguous with a component extending into the left upper quadrant series 3, image 29. Ill-defined air fluid collection in the left pericolic gutter measures approximately 7.4 x 4.3 x 8.4 cm, series 3, image 49 and series 6, image 85. There are multiple scattered small fluid collections and ill-defined fluid as well as mottled air within the right upper quadrant abdominal fat. Vascular/Lymphatic: Normal caliber abdominal aorta with mild atherosclerosis. Large presumed lymph node adjacent to the celiac axis measures 3.7 cm series 3, image 23. There are multifocal  prominent and mildly enlarged retroperitoneal nodes, including 13 mm aortocaval node series 3, image 39. Prominent and mildly enlarged mesenteric, periportal and upper abdominal nodes measuring up to 14 mm series 3, image 32  adjacent to the fluid collection. Reproductive: No prostatic mass. Other: Recent postsurgical change with open midline abdominal wound. Scattered foci of free air deep to the abdominal wound as well as patchy air within the right upper abdominal fat. Abdominopelvic fluid collections as described above. Scattered areas of non organized free fluid in the upper abdomen and pelvis. Generalized body Mcweeney edema consistent with anasarca. Musculoskeletal: There are no acute or suspicious osseous abnormalities. IMPRESSION: 1. Interval transverse colostomy. Large ill-defined air fluid collection just distal to the surgical drain in the left upper quadrant adjacent to the transverse colonic suture line measuring up to 14.9 cm, suspicious for abscess. There are multiple additional smaller air-fluid collections in the abdomen as described. Areas of non organized free fluid are also seen which are indeterminate. 2. Subcapsular fluid collection about the spleen inferiorly containing air measuring 5.1 x 3.7 x 3.1 cm, suspicious for abscess. 3. Bilateral lower lobe airspace disease, left greater than right, greater than typically seen with atelectasis and suspicious for pneumonia. Small left pleural effusion. 4. Recent postsurgical change with open midline abdominal wound. Scattered foci of free air deep to the abdominal wound as well as patchy air within the right upper quadrant abdominal fat, likely postsurgical. 5. Cholelithiasis without CT findings of acute cholecystitis. 6. Abdominal adenopathy with a dominant lymph node adjacent to the celiac axis. Prominent and mildly enlarged retroperitoneal, mesenteric, periportal, and upper abdominal nodes, nonspecific. 7. Generalized body [place] edema consistent with anasarca. Aortic Atherosclerosis (ICD10-I70.0). These results will be called to the ordering clinician or representative by the Radiologist Assistant, and communication documented in the PACS or [employer]. Electronically  Signed   By: Keith Rake M.D.   On: 02/15/2022 18:16   ECHOCARDIOGRAM COMPLETE  Result Date: 02/15/2022    ECHOCARDIOGRAM REPORT   Patient Name:   Patrick [name] Date of Exam: 02/15/2022 Medical Rec #:  [id number]    Height:       72.0 in Accession #:    [id number]   Weight:       247.8 lb Date of Birth:  [birth year]   BSA:          2.334 m Patient Age:    5 years     BP:           106/65 mmHg Patient Gender: M            HR:           104 bpm. Exam Location:  Inpatient Procedure: 2D Echo, Color Doppler and Cardiac Doppler Indications:    Fluid overload [[id number]]  History:        Patient has no prior history of Echocardiogram examinations.                 CAD, Prior CABG; Risk Factors:Diabetes.  Sonographer:    Greer Pickerel Referring Phys: [id number] Saverio Danker  Sonographer Comments: Patient is obese. Image acquisition challenging due to respiratory motion. IMPRESSIONS  1. Left ventricular ejection fraction, by estimation, is 50%. The left ventricle has mildly decreased function. The left ventricle demonstrates global hypokinesis with septal bounce suggesting prior cardiac surgery. Left ventricular diastolic parameters  are consistent with Grade II diastolic dysfunction (pseudonormalization).  2. Peak RV-RA gradient 14 mmHg. Right ventricular systolic function is normal. The right  ventricular size is normal.  3. Left atrial size was moderately dilated.  4. Right atrial size was mildly dilated.  5. The mitral valve is normal in structure. No evidence of mitral valve regurgitation. No evidence of mitral stenosis.  6. The aortic valve is tricuspid. Aortic valve regurgitation is not visualized. No aortic stenosis is present.  7. Aortic dilatation noted. There is mild dilatation of the ascending aorta, measuring 40 mm.  8. Technically difficult study with poor acoustic windows. FINDINGS  Left Ventricle: Left ventricular ejection fraction, by estimation, is 50%. The left ventricle has mildly decreased function. The left  ventricle demonstrates global hypokinesis. The left ventricular internal cavity size was normal in size. There is no left ventricular hypertrophy. Left ventricular diastolic parameters are consistent with Grade II diastolic dysfunction (pseudonormalization). Right Ventricle: Peak RV-RA gradient 14 mmHg. The right ventricular size is normal. No increase in right ventricular Helfman thickness. Right ventricular systolic function is normal. Left Atrium: Left atrial size was moderately dilated. Right Atrium: Right atrial size was mildly dilated. Pericardium: There is no evidence of pericardial effusion. Mitral Valve: The mitral valve is normal in structure. No evidence of mitral valve regurgitation. No evidence of mitral valve stenosis. Tricuspid Valve: The tricuspid valve is normal in structure. Tricuspid valve regurgitation is trivial. Aortic Valve: The aortic valve is tricuspid. Aortic valve regurgitation is not visualized. No aortic stenosis is present. Pulmonic Valve: The pulmonic valve was normal in structure. Pulmonic valve regurgitation is not visualized. Aorta: The aortic root is normal in size and structure and aortic dilatation noted. There is mild dilatation of the ascending aorta, measuring 40 mm. Venous: The inferior vena cava was not well visualized. IAS/Shunts: No atrial level shunt detected by color flow Doppler.  LEFT VENTRICLE PLAX 2D LVIDd:         5.30 cm   Diastology LVIDs:         3.70 cm   LV e' medial:    10.90 cm/s LV PW:         1.10 cm   LV E/e' medial:  11.3 LV IVS:        0.80 cm   LV e' lateral:   16.60 cm/s LVOT diam:     2.40 cm   LV E/e' lateral: 7.4 LV SV:         86 LV SV Index:   37 LVOT Area:     4.52 cm  RIGHT VENTRICLE RV S prime:     12.30 cm/s TAPSE (M-mode): 2.2 cm LEFT ATRIUM            Index        RIGHT ATRIUM           Index LA diam:      4.90 cm  2.10 cm/m   RA Area:     19.40 cm LA Vol (A2C): 47.4 ml  20.31 ml/m  RA Volume:   48.70 ml  20.87 ml/m LA Vol (A4C): 100.0 ml  42.85 ml/m  AORTIC VALVE             PULMONIC VALVE LVOT Vmax:   120.00 cm/s PR End Diast Vel: 1.83 msec LVOT Vmean:  80.400 cm/s LVOT VTI:    0.191 m  AORTA Ao Root diam: 3.80 cm Ao Asc diam:  4.00 cm MITRAL VALVE                TRICUSPID VALVE MV Area (PHT): 2.33 cm     TR Peak grad:  14.6 mmHg MV Decel Time: 325 msec     TR Vmax:        191.00 cm/s MV E velocity: 123.00 cm/s MV A velocity: 43.50 cm/s   SHUNTS MV E/A ratio:  2.83         Systemic VTI:  0.19 m                             Systemic Diam: 2.40 cm Dalton McleanMD Electronically signed by Franki Monte Signature Date/Time: 02/15/2022/5:47:20 PM    Final    DG CHEST PORT 1 VIEW  Result Date: 02/15/2022 CLINICAL DATA:  Chest pain and hypoxia. Status post colectomy and colostomy creation. EXAM: PORTABLE CHEST 1 VIEW COMPARISON:  CT abdomen 02/17/2022 and abdominal radiograph 02/06/2022 FINDINGS: Low lung volumes with some elevation of left hemidiaphragm. Patchy densities at the right lung base most likely represent atelectasis. Persistent densities at the left lung base. There is a right arm PICC line with the tip near the superior cavoatrial junction. Heart size appears to be upper limits of normal with post CABG changes. IMPRESSION: Low lung volumes with bibasilar lung densities. Findings could represent a combination of atelectasis and consolidation. Cannot exclude pleural effusions. Electronically Signed   By: Markus Daft M.D.   On: 02/15/2022 11:15    Labs:  CBC: Recent Labs    02/15/22 0441 02/16/22 0520 02/17/22 0542 02/17/22 1151 02/18/22 0516  WBC 16.1* 16.3* 16.9*  --  18.6*  HGB 7.8* 7.4* 6.7* 8.8* 8.0*  HCT 25.2* 24.8* 21.4* 26.0* 24.9*  PLT 378 389 358  --  411*    COAGS: Recent Labs    02/16/22 0631  INR 1.4*    BMP: Recent Labs    02/15/22 0441 02/16/22 0520 02/17/22 0542 02/17/22 1151 02/18/22 0516  NA 134* 134* 136 138 139  K 3.8 4.2 4.2 4.3 4.0  CL 101 101 101  --  100  CO2 '25 26 22  '$ --  29   GLUCOSE 210* 209* 192*  --  130*  BUN 24* 36* 41*  --  42*  CALCIUM 7.3* 7.3* 7.5*  --  8.0*  CREATININE 1.51* 2.02* 2.25*  --  2.21*  GFRNONAA 56* 39* 35*  --  35*    LIVER FUNCTION TESTS: Recent Labs    02/15/22 0441 02/16/22 0631 02/17/22 0542 02/18/22 0516  BILITOT 5.9*  6.2* 6.8* 6.4* 6.0*  AST 34  33 50* 41 36  ALT '18  16 20 19 19  '$ ALKPHOS 75  75 78 67 89  PROT 4.4*  4.5* 4.6* 5.0* 5.3*  ALBUMIN 1.6*  1.6* 1.5* 2.1* 2.1*    Assessment and Plan:  52 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  Drain Location: LUQ Size: Fr size: 12 Fr Date of placement: 02/16/22  Currently to: Drain collection device: gravity 24 hour output:  Output by Drain (mL) 02/16/22 0701 - 02/16/22 1900 02/16/22 1901 - 02/17/22 0700 02/17/22 0701 - 02/17/22 1900 02/17/22 1901 - 02/18/22 0700 02/18/22 0701 - 02/18/22 1731  Closed System Drain 1 Right Abdomen Bulb (JP) 19 Fr. 45 40 40 25   Closed System Drain 1 Left Abdomen Other (Comment) 12 Fr. 500 150 175 50  Interval imaging/drain manipulation:  None  Current examination: Flushes/aspirates easily.  Insertion site unremarkable. Suture and stat lock in place. Dressed appropriately.   Plan: Continue TID flushes with 5 cc NS. Record output Q shift. Dressing changes QD or PRN if soiled.  Call IR APP or on call IR MD if difficulty flushing or sudden change in drain output.  Repeat imaging/possible drain injection once output < 10 mL/QD (excluding flush material). Consideration for drain removal if output is < 10 mL/QD (excluding flush material), pending discussion with the providing surgical service.  Discharge planning: Please contact IR APP or on call IR MD prior to patient d/c to ensure appropriate  follow up plans are in place. Typically patient will follow up with IR clinic 10-14 days post d/c for repeat imaging/possible drain injection. IR scheduler will contact patient with date/time of appointment. Patient will need to flush drain QD with 5 cc NS, record output QD, dressing changes every 2-3 days or earlier if soiled.   IR will continue to follow - please call with questions or concerns.  Electronically Signed: Joaquim Nam, PA-C 02/18/2022, 8:34 AM   I spent a total of 25 Minutes at the the patient's bedside AND on the patient's hospital floor or unit, greater than 50% of which was counseling/coordinating care for intra-abdominal abscess drain placement.

## 2022-02-18 NOTE — Progress Notes (Signed)
Lower extremity venous has been completed.   Preliminary results in CV Proc.   Patrick [name] Shyloh Derosa 02/18/2022 11:18 AM

## 2022-02-18 DEATH — deceased

## 2022-02-19 ENCOUNTER — Inpatient Hospital Stay (HOSPITAL_COMMUNITY): Payer: BC Managed Care – PPO

## 2022-02-19 ENCOUNTER — Encounter (HOSPITAL_COMMUNITY): Payer: Self-pay

## 2022-02-19 ENCOUNTER — Inpatient Hospital Stay (HOSPITAL_COMMUNITY): Payer: BC Managed Care – PPO | Admitting: Anesthesiology

## 2022-02-19 ENCOUNTER — Encounter (HOSPITAL_COMMUNITY): Admission: EM | Disposition: E | Payer: Self-pay | Source: Ambulatory Visit

## 2022-02-19 ENCOUNTER — Other Ambulatory Visit: Payer: Self-pay

## 2022-02-19 DIAGNOSIS — K559 Vascular disorder of intestine, unspecified: Secondary | ICD-10-CM | POA: Diagnosis not present

## 2022-02-19 DIAGNOSIS — L8990 Pressure ulcer of unspecified site, unspecified stage: Secondary | ICD-10-CM | POA: Insufficient documentation

## 2022-02-19 DIAGNOSIS — R1900 Intra-abdominal and pelvic swelling, mass and lump, unspecified site: Secondary | ICD-10-CM | POA: Diagnosis not present

## 2022-02-19 HISTORY — PX: LAPAROTOMY: SHX154

## 2022-02-19 LAB — POCT I-STAT 7, (LYTES, BLD GAS, ICA,H+H)
Acid-Base Excess: 10 mmol/L — ABNORMAL HIGH (ref 0.0–2.0)
Bicarbonate: 35.6 mmol/L — ABNORMAL HIGH (ref 20.0–28.0)
Calcium, Ion: 1.09 mmol/L — ABNORMAL LOW (ref 1.15–1.40)
HCT: 33 % — ABNORMAL LOW (ref 39.0–52.0)
Hemoglobin: 11.2 g/dL — ABNORMAL LOW (ref 13.0–17.0)
O2 Saturation: 97 %
Patient temperature: 98.6
Potassium: 4.5 mmol/L (ref 3.5–5.1)
Sodium: 137 mmol/L (ref 135–145)
TCO2: 37 mmol/L — ABNORMAL HIGH (ref 22–32)
pCO2 arterial: 51.9 mmHg — ABNORMAL HIGH (ref 32–48)
pH, Arterial: 7.444 (ref 7.35–7.45)
pO2, Arterial: 93 mmHg (ref 83–108)

## 2022-02-19 LAB — COMPREHENSIVE METABOLIC PANEL
ALT: 16 U/L (ref 0–44)
AST: 33 U/L (ref 15–41)
Albumin: 1.9 g/dL — ABNORMAL LOW (ref 3.5–5.0)
Alkaline Phosphatase: 115 U/L (ref 38–126)
Anion gap: 13 (ref 5–15)
BUN: 39 mg/dL — ABNORMAL HIGH (ref 6–20)
CO2: 33 mmol/L — ABNORMAL HIGH (ref 22–32)
Calcium: 7.9 mg/dL — ABNORMAL LOW (ref 8.9–10.3)
Chloride: 90 mmol/L — ABNORMAL LOW (ref 98–111)
Creatinine, Ser: 2.1 mg/dL — ABNORMAL HIGH (ref 0.61–1.24)
GFR, Estimated: 38 mL/min — ABNORMAL LOW (ref >60)
Glucose, Bld: 284 mg/dL — ABNORMAL HIGH (ref 70–99)
Potassium: 3.7 mmol/L (ref 3.5–5.1)
Sodium: 136 mmol/L (ref 135–145)
Total Bilirubin: 5.5 mg/dL — ABNORMAL HIGH (ref 0.3–1.2)
Total Protein: 5.8 g/dL — ABNORMAL LOW (ref 6.5–8.1)

## 2022-02-19 LAB — MAGNESIUM: Magnesium: 1.8 mg/dL (ref 1.7–2.4)

## 2022-02-19 LAB — GLUCOSE, CAPILLARY
Glucose-Capillary: 156 mg/dL — ABNORMAL HIGH (ref 70–99)
Glucose-Capillary: 156 mg/dL — ABNORMAL HIGH (ref 70–99)
Glucose-Capillary: 161 mg/dL — ABNORMAL HIGH (ref 70–99)
Glucose-Capillary: 162 mg/dL — ABNORMAL HIGH (ref 70–99)
Glucose-Capillary: 223 mg/dL — ABNORMAL HIGH (ref 70–99)
Glucose-Capillary: 297 mg/dL — ABNORMAL HIGH (ref 70–99)

## 2022-02-19 LAB — PHOSPHORUS: Phosphorus: 5 mg/dL — ABNORMAL HIGH (ref 2.5–4.6)

## 2022-02-19 LAB — SURGICAL PATHOLOGY

## 2022-02-19 SURGERY — LAPAROTOMY, EXPLORATORY
Anesthesia: General | Site: Abdomen

## 2022-02-19 MED ORDER — SUCCINYLCHOLINE CHLORIDE 200 MG/10ML IV SOSY
PREFILLED_SYRINGE | INTRAVENOUS | Status: DC | PRN
  Administered 2022-02-19: 100 mg via INTRAVENOUS

## 2022-02-19 MED ORDER — MIDAZOLAM HCL 2 MG/2ML IJ SOLN
INTRAMUSCULAR | Status: AC
  Filled 2022-02-19: qty 2

## 2022-02-19 MED ORDER — LIDOCAINE 2% (20 MG/ML) 5 ML SYRINGE
INTRAMUSCULAR | Status: DC | PRN
  Administered 2022-02-19: 100 mg via INTRAVENOUS

## 2022-02-19 MED ORDER — ETOMIDATE 2 MG/ML IV SOLN
INTRAVENOUS | Status: DC | PRN
  Administered 2022-02-19: 12 mg via INTRAVENOUS

## 2022-02-19 MED ORDER — TRACE MINERALS CU-MN-SE-ZN 300-55-60-3000 MCG/ML IV SOLN
INTRAVENOUS | Status: AC
  Filled 2022-02-19: qty 883.2

## 2022-02-19 MED ORDER — DOCUSATE SODIUM 50 MG/5ML PO LIQD
100.0000 mg | Freq: Two times a day (BID) | ORAL | Status: DC
  Administered 2022-02-19: 100 mg
  Filled 2022-02-19 (×2): qty 10

## 2022-02-19 MED ORDER — MAGNESIUM SULFATE 2 GM/50ML IV SOLN
2.0000 g | Freq: Once | INTRAVENOUS | Status: AC
  Administered 2022-02-19: 2 g via INTRAVENOUS
  Filled 2022-02-19: qty 50

## 2022-02-19 MED ORDER — ACETAMINOPHEN 10 MG/ML IV SOLN
1000.0000 mg | Freq: Four times a day (QID) | INTRAVENOUS | Status: AC
  Administered 2022-02-19 – 2022-02-20 (×4): 1000 mg via INTRAVENOUS
  Filled 2022-02-19 (×4): qty 100

## 2022-02-19 MED ORDER — SODIUM CHLORIDE 0.9 % IV SOLN: INTRAVENOUS | Status: DC | PRN

## 2022-02-19 MED ORDER — DEXAMETHASONE SODIUM PHOSPHATE 10 MG/ML IJ SOLN
INTRAMUSCULAR | Status: DC | PRN
  Administered 2022-02-19: 5 mg via INTRAVENOUS

## 2022-02-19 MED ORDER — PROPOFOL 10 MG/ML IV BOLUS
INTRAVENOUS | Status: AC
  Filled 2022-02-19: qty 20

## 2022-02-19 MED ORDER — PHENAZOPYRIDINE HCL 100 MG PO TABS
100.0000 mg | ORAL_TABLET | Freq: Three times a day (TID) | ORAL | Status: DC
  Filled 2022-02-19 (×2): qty 1

## 2022-02-19 MED ORDER — DEXAMETHASONE SODIUM PHOSPHATE 10 MG/ML IJ SOLN
INTRAMUSCULAR | Status: AC
  Filled 2022-02-19: qty 1

## 2022-02-19 MED ORDER — PROPOFOL 1000 MG/100ML IV EMUL
0.0000 ug/kg/min | INTRAVENOUS | Status: DC
  Administered 2022-02-19: 50 ug/kg/min via INTRAVENOUS
  Administered 2022-02-19: 25 ug/kg/min via INTRAVENOUS
  Administered 2022-02-19: 50 ug/kg/min via INTRAVENOUS
  Administered 2022-02-20: 40 ug/kg/min via INTRAVENOUS
  Administered 2022-02-20 (×2): 50 ug/kg/min via INTRAVENOUS
  Filled 2022-02-19 (×3): qty 100
  Filled 2022-02-19: qty 200

## 2022-02-19 MED ORDER — FUROSEMIDE 10 MG/ML IJ SOLN
80.0000 mg | Freq: Once | INTRAMUSCULAR | Status: AC
  Administered 2022-02-19: 80 mg via INTRAVENOUS
  Filled 2022-02-19: qty 8

## 2022-02-19 MED ORDER — ORAL CARE MOUTH RINSE: 15.0000 mL | OROMUCOSAL | Status: DC | PRN

## 2022-02-19 MED ORDER — ORAL CARE MOUTH RINSE
15.0000 mL | OROMUCOSAL | Status: DC
  Administered 2022-02-19 – 2022-02-20 (×13): 15 mL via OROMUCOSAL

## 2022-02-19 MED ORDER — FENTANYL CITRATE PF 50 MCG/ML IJ SOSY
50.0000 ug | PREFILLED_SYRINGE | Freq: Once | INTRAMUSCULAR | Status: AC
  Administered 2022-02-19: 50 ug via INTRAVENOUS
  Filled 2022-02-19: qty 1

## 2022-02-19 MED ORDER — ROCURONIUM BROMIDE 10 MG/ML (PF) SYRINGE
PREFILLED_SYRINGE | INTRAVENOUS | Status: AC
  Filled 2022-02-19: qty 10

## 2022-02-19 MED ORDER — ROCURONIUM BROMIDE 10 MG/ML (PF) SYRINGE
PREFILLED_SYRINGE | INTRAVENOUS | Status: DC | PRN
  Administered 2022-02-19 (×2): 50 mg via INTRAVENOUS

## 2022-02-19 MED ORDER — FENTANYL BOLUS VIA INFUSION: 50.0000 ug | INTRAVENOUS | Status: DC | PRN

## 2022-02-19 MED ORDER — FENTANYL 2500MCG IN NS 250ML (10MCG/ML) PREMIX INFUSION
50.0000 ug/h | INTRAVENOUS | Status: DC
  Administered 2022-02-19: 50 ug/h via INTRAVENOUS
  Filled 2022-02-19: qty 250

## 2022-02-19 MED ORDER — POTASSIUM CHLORIDE 10 MEQ/50ML IV SOLN
10.0000 meq | INTRAVENOUS | Status: AC
  Administered 2022-02-19 (×2): 10 meq via INTRAVENOUS
  Filled 2022-02-19: qty 50

## 2022-02-19 MED ORDER — LACTATED RINGERS IV SOLN: INTRAVENOUS | Status: DC | PRN

## 2022-02-19 MED ORDER — POLYETHYLENE GLYCOL 3350 17 G PO PACK: 17.0000 g | PACK | Freq: Every day | ORAL | Status: DC

## 2022-02-19 MED ORDER — FENTANYL CITRATE (PF) 250 MCG/5ML IJ SOLN
INTRAMUSCULAR | Status: AC
  Filled 2022-02-19: qty 5

## 2022-02-19 MED ORDER — 0.9 % SODIUM CHLORIDE (POUR BTL) OPTIME
TOPICAL | Status: DC | PRN
  Administered 2022-02-19: 2000 mL

## 2022-02-19 MED ORDER — ETOMIDATE 2 MG/ML IV SOLN
INTRAVENOUS | Status: AC
  Filled 2022-02-19: qty 10

## 2022-02-19 MED ORDER — ONDANSETRON HCL 4 MG/2ML IJ SOLN
INTRAMUSCULAR | Status: AC
  Filled 2022-02-19: qty 2

## 2022-02-19 MED ORDER — FENTANYL CITRATE (PF) 250 MCG/5ML IJ SOLN
INTRAMUSCULAR | Status: DC | PRN
  Administered 2022-02-19: 50 ug via INTRAVENOUS
  Administered 2022-02-19 (×4): 25 ug via INTRAVENOUS
  Administered 2022-02-19: 100 ug via INTRAVENOUS

## 2022-02-19 MED ORDER — LIDOCAINE 2% (20 MG/ML) 5 ML SYRINGE
INTRAMUSCULAR | Status: AC
  Filled 2022-02-19: qty 5

## 2022-02-19 MED ORDER — INSULIN ASPART 100 UNIT/ML IJ SOLN
0.0000 [IU] | INTRAMUSCULAR | Status: DC
  Administered 2022-02-19: 7 [IU] via SUBCUTANEOUS
  Administered 2022-02-19: 4 [IU] via SUBCUTANEOUS
  Administered 2022-02-19: 11 [IU] via SUBCUTANEOUS
  Administered 2022-02-19: 4 [IU] via SUBCUTANEOUS
  Administered 2022-02-20 (×3): 7 [IU] via SUBCUTANEOUS
  Administered 2022-02-20: 15 [IU] via SUBCUTANEOUS
  Administered 2022-02-20: 7 [IU] via SUBCUTANEOUS
  Administered 2022-02-21: 3 [IU] via SUBCUTANEOUS
  Administered 2022-02-21 (×2): 4 [IU] via SUBCUTANEOUS
  Administered 2022-02-21: 3 [IU] via SUBCUTANEOUS
  Administered 2022-02-21: 4 [IU] via SUBCUTANEOUS
  Administered 2022-02-22 (×3): 3 [IU] via SUBCUTANEOUS
  Administered 2022-02-22: 4 [IU] via SUBCUTANEOUS
  Administered 2022-02-22 (×2): 3 [IU] via SUBCUTANEOUS
  Administered 2022-02-23: 4 [IU] via SUBCUTANEOUS
  Administered 2022-02-23: 3 [IU] via SUBCUTANEOUS
  Administered 2022-02-23: 4 [IU] via SUBCUTANEOUS
  Administered 2022-02-23 (×2): 3 [IU] via SUBCUTANEOUS
  Administered 2022-02-23: 4 [IU] via SUBCUTANEOUS
  Administered 2022-02-23: 3 [IU] via SUBCUTANEOUS
  Administered 2022-02-24 (×2): 4 [IU] via SUBCUTANEOUS
  Administered 2022-02-24 (×2): 3 [IU] via SUBCUTANEOUS
  Administered 2022-02-25 (×3): 4 [IU] via SUBCUTANEOUS
  Administered 2022-02-25: 3 [IU] via SUBCUTANEOUS
  Administered 2022-02-25 (×2): 4 [IU] via SUBCUTANEOUS
  Administered 2022-02-26 (×2): 3 [IU] via SUBCUTANEOUS
  Administered 2022-02-26: 4 [IU] via SUBCUTANEOUS
  Administered 2022-02-26 – 2022-02-27 (×8): 3 [IU] via SUBCUTANEOUS
  Administered 2022-02-27: 4 [IU] via SUBCUTANEOUS
  Administered 2022-02-27: 3 [IU] via SUBCUTANEOUS
  Administered 2022-02-28 (×2): 4 [IU] via SUBCUTANEOUS
  Administered 2022-02-28: 3 [IU] via SUBCUTANEOUS
  Administered 2022-02-28: 4 [IU] via SUBCUTANEOUS
  Administered 2022-02-28: 3 [IU] via SUBCUTANEOUS
  Administered 2022-03-01 (×2): 4 [IU] via SUBCUTANEOUS
  Administered 2022-03-01: 7 [IU] via SUBCUTANEOUS
  Administered 2022-03-01 (×2): 4 [IU] via SUBCUTANEOUS
  Administered 2022-03-01: 7 [IU] via SUBCUTANEOUS
  Administered 2022-03-02: 11 [IU] via SUBCUTANEOUS
  Administered 2022-03-02: 4 [IU] via SUBCUTANEOUS
  Administered 2022-03-02: 7 [IU] via SUBCUTANEOUS

## 2022-02-19 SURGICAL SUPPLY — 42 items
BAG COUNTER SPONGE SURGICOUNT (BAG) ×1 IMPLANT
BIOPATCH RED 1 DISK 7.0 (GAUZE/BANDAGES/DRESSINGS) IMPLANT
BNDG GAUZE DERMACEA FLUFF 4 (GAUZE/BANDAGES/DRESSINGS) IMPLANT
CANISTER SUCT 3000ML PPV (MISCELLANEOUS) ×1 IMPLANT
CATH ROBINSON RED A/P 12FR (CATHETERS) IMPLANT
CHLORAPREP W/TINT 26 (MISCELLANEOUS) ×1 IMPLANT
COVER SURGICAL LIGHT HANDLE (MISCELLANEOUS) ×1 IMPLANT
DRAIN CHANNEL 19F RND (DRAIN) IMPLANT
DRAPE LAPAROSCOPIC ABDOMINAL (DRAPES) ×1 IMPLANT
DRAPE WARM FLUID 44X44 (DRAPES) ×1 IMPLANT
DRSG IV TEGADERM 3.5X4.5 STRL (GAUZE/BANDAGES/DRESSINGS) IMPLANT
ELECT BLADE 6.5 EXT (BLADE) IMPLANT
ELECT CAUTERY BLADE 6.4 (BLADE) ×1 IMPLANT
ELECT REM PT RETURN 9FT ADLT (ELECTROSURGICAL) ×1
ELECTRODE REM PT RTRN 9FT ADLT (ELECTROSURGICAL) ×1 IMPLANT
EVACUATOR SILICONE 100CC (DRAIN) IMPLANT
GAUZE SPONGE 4X4 12PLY STRL (GAUZE/BANDAGES/DRESSINGS) IMPLANT
GLOVE BIO SURGEON STRL SZ7.5 (GLOVE) ×1 IMPLANT
GOWN STRL REUS W/ TWL LRG LVL3 (GOWN DISPOSABLE) ×2 IMPLANT
GOWN STRL REUS W/TWL LRG LVL3 (GOWN DISPOSABLE) ×2
HANDLE SUCTION POOLE (INSTRUMENTS) ×1 IMPLANT
KIT BASIN OR (CUSTOM PROCEDURE TRAY) ×1 IMPLANT
KIT OSTOMY DRAINABLE 2.75 STR (WOUND CARE) IMPLANT
KIT TURNOVER KIT B (KITS) ×1 IMPLANT
NS IRRIG 1000ML POUR BTL (IV SOLUTION) ×2 IMPLANT
PACK GENERAL/GYN (CUSTOM PROCEDURE TRAY) ×1 IMPLANT
PAD ABD 8X10 STRL (GAUZE/BANDAGES/DRESSINGS) IMPLANT
PAD ARMBOARD 7.5X6 YLW CONV (MISCELLANEOUS) ×1 IMPLANT
STAPLER VISISTAT 35W (STAPLE) ×1 IMPLANT
SUCTION POOLE HANDLE (INSTRUMENTS) ×1
SUT BOLSTER RETENT 3/16X1 3/4 (MISCELLANEOUS) ×1
SUT ETHIBOND 5 LR DA (SUTURE) IMPLANT
SUT ETHILON 1 LR 30 (SUTURE) IMPLANT
SUT ETHILON 2 0 FS 18 (SUTURE) IMPLANT
SUT PDS AB 1 TP1 96 (SUTURE) ×2 IMPLANT
SUT SILK 2 0 SH CR/8 (SUTURE) ×1 IMPLANT
SUT SILK 2 0 TIES 10X30 (SUTURE) ×1 IMPLANT
SUT SILK 3 0 SH CR/8 (SUTURE) ×1 IMPLANT
SUT SILK 3 0 TIES 10X30 (SUTURE) ×1 IMPLANT
SUTURE BOLSTER RTNT 3/16X1 3/4 (MISCELLANEOUS) IMPLANT
TOWEL GREEN STERILE (TOWEL DISPOSABLE) ×1 IMPLANT
YANKAUER SUCT BULB TIP NO VENT (SUCTIONS) IMPLANT

## 2022-02-19 NOTE — Progress Notes (Signed)
PROGRESS NOTE  Patrick [name]  DOB: January 06, 1971  PCP: Caryl Bis, MD MWU:[id number]  DOA: 01/20/2022  LOS: 10 days  Hospital Day: 11  Brief narrative: Patrick [name] is a 52 y.o. male with PMH significant for DM2, HTN, HLD, CAD/CABG, diabetic neuropathy, chronic osteomyelitis of both feet.  Patient has had abdominal pain for 4 weeks which increased in severity for about a week after which she went to see his PCP.  CT scan of abdomen was obtained on 1/23 which showed extensive small bowel ischemia with pneumatosis in the left upper quadrant with surrounding pulmonary changes and probable abscess and pneumoperitoneum.  Patient was hence directed to the ED on 1/23.  In the ED, CT scan was repeated which showed marked severity colitis involving the distal transverse colon with a large abscess versus mass with markedly inflamed, likely ischemic partially obstructed proximal small bowel loops.  Also showed cholelithiasis, possible acute pyelonephritis. Patient was admitted to general surgery service. See below for the details of surgery and postsurgical course. 1/29, hospital service was consulted for comanagement of medical issues.   Subjective: Patient was seen and examined this morning.   Lying on bed. Lethargic today.  Family members at bedside. Surgical pathology is back showing high-grade B-cell lymphoma. Noted a plan from general surgery to take him to the OR today for suspicion of anastomotic leak.  Assessment and plan: Retroperitoneal mass with invasion of the transverse colon with proximal small bowel with ischemic necrotic proximal jejunum with perforation with fecal contamination with abscess High-grade B-cell lymphoma 1/24, patient underwent expiratory laparotomy.  He was found to have a retroperitoneal mass with invasion of the transverse colon, proximal small bowel with ischemic necrotic proximal jejunum and perforation fecal contamination with abscess.  He underwent resection of  the transverse colon, drainage of mesenteric abscess, resection of proximal jejunum with small bowel anastomosis and diverting right colostomy and long Hartmann pouch. Surgical pathology showed high-grade B-cell lymphoma Postsurgically, patient had postop ileus.  He was started on TPN 1/29, repeat CT abdomen showed a large intra-abdominal abscess adjacent to the transverse colonic suture line 1/30, patient underwent CT-guided drainage of left upper quadrant abscess by IR. 2/2, anastomotic leak suspected.  General surgery plans to take him to the OR today again. On TPN present for surgery.  Sepsis - not POA Intra-abdominal infection Postsurgically, patient had fever up to 101 with tachycardia, leukocytosis CT abdomen showed intra-abdominal abscesses.  It also showed possible bilateral pneumonia. No growth in blood cultures so far Sepsis likely due to anastomotic leak. Currently on IV Zosyn. No fever in the last 24 hours.  WBC count still remains elevated and is rising Recent Labs  Lab 02/15/22 0441 02/16/22 0520 02/17/22 0542 02/18/22 0516 02/21/2022 0500  WBC 16.1* 16.3* 16.9* 18.6* 19.3*  PROCALCITON 2.93  --   --   --   --     Acute blood loss anemia Hemoglobin has been running low this admission 1/31, it was 6.7.  2 units of PRBC transfusion given with improvement to >2.  Recent Labs    02/16/22 0520 02/17/22 0542 02/17/22 1151 02/18/22 0516 03/13/2022 0500  HGB 7.4* 6.7* 8.8* 8.0* 8.7*  MCV 78.7* 78.4*  --  77.8* 92.9   Acute kidney injury Mild systolic CHF Creatinine normal at baseline.  Presented with creatinine up 1.57, fluctuated postoperatively.  Creatinine trended up to peak at 2.25. Blood pressures running low normal. Heart rate elevated.   Echocardiogram 1/29 showed LVEF 50% with global  hypokinesis, grade 2 diastolic dysfunction Mild systolic dysfunction likely due to acute illness. Probably has low effective arterial volume due to third spacing Intermittently  given IV albumin Currently on TPN Currently on Lasix drip by nephrology. Recent Labs    02/11/22 0432 02/12/22 0240 02/13/22 0500 02/13/22 0856 02/14/22 0445 02/15/22 0441 02/16/22 0520 02/17/22 0542 02/18/22 0516 03/09/2022 0651  BUN '19 20 14 15 16 '$ 24* 36* 41* 42* 39*  CREATININE 1.28* 1.06 0.92 1.05 1.18 1.51* 2.02* 2.25* 2.21* 2.10*   Acute respiratory with hypoxia Worsening oxygen requirement.  On 5 L this morning. As discussed above, worsening oxygen requirement is multifactorial- secondary to combination of atelectasis as well as bilateral pneumonia, worsening anemia and mild systolic CHF. Wean down as tolerated.  Hyperbilirubinemia Cholelithiasis LFTs trend as below.  Noted rising both total and direct bilirubin without rising AST, ALT or alk phos.  Unclear etiology. CT abdomen showed cholelithiasis without choledocholithiasis or CBD dilatation. Bilirubin level improving. Continue to monitor Recent Labs  Lab 02/15/22 0441 02/16/22 0520 02/16/22 0631 02/17/22 0542 02/18/22 0516 02/21/2022 0500 03/11/2022 0651  AST 34  33  --  50* 41 36  --  33  ALT 18  16  --  '20 19 19  '$ --  16  ALKPHOS 75  75  --  78 67 89  --  115  BILITOT 5.9*  6.2*  --  6.8* 6.4* 6.0*  --  5.5*  BILIDIR 4.1*  --  4.7*  --   --   --   --   IBILI 1.8*  --  2.1*  --   --   --   --   PROT 4.4*  4.5*  --  4.6* 5.0* 5.3*  --  5.8*  ALBUMIN 1.6*  1.6*  --  1.5* 2.1* 2.1*  --  1.9*  INR  --   --  1.4*  --   --   --   --   PLT 378 389  --  358 411* 497*  --    Type 2 diabetes mellitus A1c 6.6 on 02/17/2022 PTA on glipizide 5 mg twice daily Currently on sliding scale insulin only Recent Labs  Lab 02/18/22 1947 02/18/22 2336 02/27/2022 0338 03/17/2022 0841 03/13/2022 1223  GLUCAP 166* 220* 156* 161* 162*   Acute hypoxic respiratory failure Secondary to atelectasis.   Continue supplemental oxygen  Encourage use of incentive spirometry and OOB   CAD/CABG PTA on aspirin 81 mg daily, Lipitor 20  mg daily Currently on hold  Chronic osteomyelitis of both feet S/p ray amputations 2020  Anxiety/depression PTA on Lexapro 20 mg daily, Currently on hold   Mobility: Needs PT evaluation once clinically improved.  Goals of care   Code Status: Full Code    DVT prophylaxis:  SCDs Start: 01/18/2022 2308   Antimicrobials: IV Zosyn Fluid: TPN Family Communication: Family at bedside  Continue in-hospital care because: Not medically stable for discharge yet.  Planned for another surgery today   Scheduled Meds:  alteplase  2 mg Intracatheter Once   Chlorhexidine Gluconate Cloth  6 each Topical Daily   enoxaparin (LOVENOX) injection  55 mg Subcutaneous Q24H   insulin aspart  0-20 Units Subcutaneous Q4H   phenazopyridine  100 mg Oral TID WC   sodium chloride flush  10-40 mL Intracatheter Q12H   sodium chloride flush  5 mL Intracatheter Q8H    PRN meds: sodium chloride, acetaminophen, HYDROmorphone (DILAUDID) injection, methocarbamol, ondansetron (ZOFRAN) IV, mouth rinse, oxyCODONE, prochlorperazine, simethicone,  sodium chloride flush   Infusions:   sodium chloride Stopped (02/26/2022 1214)   norepinephrine (LEVOPHED) Adult infusion 6 mcg/min (03/08/2022 1300)   piperacillin-tazobactam (ZOSYN)  IV 12.5 mL/hr at 02/28/2022 1300   TPN ADULT (ION) 80 mL/hr at 02/21/2022 1300   TPN ADULT (ION)      Diet:  Diet Order             Diet NPO time specified Except for: Ice Chips, Sips with Meds  Diet effective midnight                   Antimicrobials: Anti-infectives (From admission, onward)    Start     Dose/Rate Route Frequency Ordered Stop   02/17/22 1200  piperacillin-tazobactam (ZOSYN) IVPB 3.375 g        3.375 g 12.5 mL/hr over 240 Minutes Intravenous Every 8 hours 02/17/22 1103     02/15/22 1500  piperacillin-tazobactam (ZOSYN) IVPB 3.375 g        3.375 g 12.5 mL/hr over 240 Minutes Intravenous Every 8 hours 02/15/22 0836 02/17/22 0217   02/16/2022 1100   piperacillin-tazobactam (ZOSYN) IVPB 3.375 g        3.375 g 100 mL/hr over 30 Minutes Intravenous  Once 02/17/2022 1056 02/14/2022 1230   02/08/2022 0200  piperacillin-tazobactam (ZOSYN) IVPB 3.375 g  Status:  Discontinued        3.375 g 12.5 mL/hr over 240 Minutes Intravenous Every 8 hours 01/21/2022 2322 02/15/22 0836   02/05/2022 1900  piperacillin-tazobactam (ZOSYN) IVPB 3.375 g        3.375 g 100 mL/hr over 30 Minutes Intravenous  Once 01/19/2022 1857 02/08/2022 2012       Skin assessment:  Pressure Injury 02/22/2022 Buttocks Mid Stage 1 -  Intact skin with non-blanchable redness of a localized area usually over a bony prominence. (Active)  02/28/2022 0900  Location: Buttocks  Location Orientation: Mid  Staging: Stage 1 -  Intact skin with non-blanchable redness of a localized area usually over a bony prominence.  Wound Description (Comments):   Present on Admission:       Nutritional status:  Body mass index is 34.65 kg/m.  Nutrition Problem: Severe Malnutrition Etiology: acute illness (mass with perforation) Signs/Symptoms: moderate fat depletion, energy intake < or equal to 50% for > or equal to 5 days     Objective: Vitals:   03/08/2022 1245 03/15/2022 1300  BP: 99/67 108/66  Pulse: 92 99  Resp: 17 20  Temp:    SpO2: 90% 97%    Intake/Output Summary (Last 24 hours) at 02/23/2022 1402 Last data filed at 02/26/2022 1300 Gross per 24 hour  Intake 2652.24 ml  Output 8229 ml  Net -5576.76 ml   Filed Weights   02/18/22 0500 02/23/2022 0500 02/21/2022 0919  Weight: 116.4 kg 113.6 kg 115.9 kg   Weight change: -2.8 kg Body mass index is 34.65 kg/m.   Physical Exam: General exam: Pleasant middle-aged Caucasian male. Not in pain Skin: No rashes, lesions or ulcers. Grossly jaundiced.  HEENT: Atraumatic, normocephalic, no obvious bleeding Lungs: Diminished air entry in both bases.  On 5 L oxygen by nasal cannula this morning. CVS: Regular rate and rhythm, no murmur GI/Abd soft,  appropriate postop tenderness.  Bowel sound present.   CNS: Alert, awake, oriented x 3 Psychiatry: Sad affect Extremities: 1-2+ bilateral pedal edema, no calf tenderness  Data Review: I have personally reviewed the laboratory data and studies available.  F/u labs ordered Unresulted Labs (From admission, onward)  Start     Ordered   02/18/22 0500  CBC  Daily,   R     Question:  Specimen collection method  Answer:  Unit=Unit collect   02/17/22 1103   02/18/22 0500  Comprehensive metabolic panel  Daily,   R     Question:  Specimen collection method  Answer:  Unit=Unit collect   02/17/22 1103   02/18/22 0500  Urea nitrogen, urine  Tomorrow morning,   R        02/17/22 1913   02/17/22 1103  Blood gas, arterial  ONCE - STAT,   STAT        02/17/22 1102   02/15/22 0500  Comprehensive metabolic panel  (TPN Lab Panel)  Every Mon,Thu (0500),   R     Question:  Specimen collection method  Answer:  Lab=Lab collect   02/12/22 1001   02/15/22 0500  Magnesium  (TPN Lab Panel)  Every Mon,Thu (0500),   R     Question:  Specimen collection method  Answer:  Lab=Lab collect   02/12/22 1001   02/15/22 0500  Phosphorus  (TPN Lab Panel)  Every Mon,Thu (0500),   R     Question:  Specimen collection method  Answer:  Lab=Lab collect   02/12/22 1001   02/15/22 0500  Triglycerides  (TPN Lab Panel)  Every Mon,Thu (0500),   R     Question:  Specimen collection method  Answer:  Lab=Lab collect   02/12/22 1001            Total time spent in review of labs and imaging, patient evaluation, formulation of plan, documentation and communication with family: 72 minutes  Signed, Terrilee Croak, MD [facility] 03/01/2022

## 2022-02-19 NOTE — Progress Notes (Signed)
PHARMACY - TOTAL PARENTERAL NUTRITION CONSULT NOTE  Indication: Prolonged ileus  Patient Measurements: Height: 6' (182.9 cm) Weight: 115.9 kg (255 lb 8.2 oz) IBW/kg (Calculated) : 77.6 TPN AdjBW (KG): 86.6 Body mass index is 34.65 kg/m.  Assessment:  70 YOM presented with abdominal pain for 4 weeks, found to have retroperitoneal mass with necrotic jejunum, perforated fecal contamination and abscess. 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 Patient has been NPO for 4 days since admission. Patient/wife report he wasn't eating well for 4 weeks PTA and basically on a liquid diet. He endorses a ~10 lb weight loss during this time. Previously, he typically ate 2 meals a day (chicken, vegetables, beans and fruits) and 1 snack. Pharmacy consulted to dose TPN.  Glucose / Insulin: hx DM (A1C 6.6) on glipizide PTA. CBGs < 180 Received 17 units SSI in past 24 hrs, 50 units in TPN Electrolytes: K: 3.7, Mag: 1.9 (goal >= 2) , Phos: 5.0, all others WNL  Renal: AKI - SCr down 2.10 (BL 1), BUN down 39, s/p lasix gtt 2/1-2/2 AM, lasix x 1 on 2/2 PM  Hepatic: LFTs WNL, tbili down to 5.5, TG 201, albumin 1.9  S/p thiamine x 3 days (1/26>1/28) Intake / Output; MIVF: UOP 3.83m/kg/hr, drain 1240m colostomy 9571mnet +6L  GI Imaging:  1/23 CT abd pelvis - Marked severity colitis with adjacent abscess  1/29 CT abd pelvis - suspicious for abscesses and PNA, small pleural effusion, anasarca GI Surgeries / Procedures:  1/24 s/p ex lap with resection of transverse colon, drainage of mesenteric abscess, resection of proximal jejunum with SB anastomosis and diverting R colostomy and long Hartman's pouch 1/30 IR per drain placement  Central access: PICC 02/12/22 TPN start date: 02/12/22  Nutritional Goals:  RD Estimated Needs Total Energy Estimated Needs:  2300-2500 Total Protein Estimated Needs: 120-135 grams Total Fluid Estimated Needs: >2 L/day   Current Nutrition:  TPN NPO except ice chips 2/2    Plan:  Continue concentrated TPN at goal rate 80 ml/hr to provide 132g AA, 355g CHO and 2324 kCal, meeting 100% of needs Electrolytes in TPN: Na 125m15m, reduce K 50mE59mempirically due to lasix gtt discontinuation, has been stable on ~108mEq32m, Ca 3mEq/L28mg 5mEq/L,42mduce Phos to 5mmol/L;53m:Ac 1:2.  20mEq IV 52mordered per MD. Mag sulfate 2gm IV x 1 with diuresis  Add standard MVI to TPN T-bili improved < 6. Patient mildly jaundice in appearance. Will add back 1/2 trace elements, continue to hold chromium for now but consider adding back on 2/3 pending Scr trend.  Increase to resistant SSI Q4H and continue 50 units insulin in TPN bag, will not change insulin within bag. With diet downgrade anticipate some degree of improvement in hyperglycemia.  Monitor TPN labs daily until stable, then standard on Mon/Thurs.  Monitor TG (ILE 25% of total kCal).  Patrick Charpentier WiEsmeralda ArthurBCCCP  03/10/2022, 10:06 AM

## 2022-02-19 NOTE — Interval H&P Note (Signed)
History and Physical Interval Note:  03/12/2022 1:42 PM  Patrick [name]  has presented today for surgery, with the diagnosis of SBO.  The various methods of treatment have been discussed with the patient and family. After consideration of risks, benefits and other options for treatment, the patient has consented to  Procedure(s): EXPLORATORY LAPAROTOMY WITH ABDOMINAL IRRIGATION AND DEBRIDMENT POSSIBLE BOWEL RESECTION (N/A) as a surgical intervention.  The patient's history has been reviewed, patient examined, no change in status, stable for surgery.  I have reviewed the patient's chart and labs.  Questions were answered to the patient's satisfaction.     Autumn Messing III

## 2022-02-19 NOTE — Transfer of Care (Signed)
Immediate Anesthesia Transfer of Care Note  Patient: Patrick [name]  Procedure(s) Performed: EXPLORATORY LAPAROTOMY WITH ABDOMINAL IRRIGATION AND DEBRIDMENT (Abdomen)  Patient Location: ICU  Anesthesia Type:General  Level of Consciousness: sedated and Patient remains intubated per anesthesia plan  Airway & Oxygen Therapy: Patient remains intubated per anesthesia plan and Patient placed on Ventilator (see vital sign flow sheet for setting)  Post-op Assessment: Report given to RN and Post -op Vital signs reviewed and stable  Post vital signs: Reviewed  Last Vitals:  Vitals Value Taken Time  BP 101/66   Temp 98.0   Pulse 116 02/23/2022 1557  Resp 16 03/06/2022 1558  SpO2 96 % 02/28/2022 1557  Vitals shown include unvalidated device data.  Last Pain:  Vitals:   03/16/2022 1354  TempSrc:   PainSc: Asleep      Patients Stated Pain Goal: 0 (06/98/61 4830)  Complications: No notable events documented.

## 2022-02-19 NOTE — Progress Notes (Signed)
Long discussion with patient, wife, and son at the bedside giving updates on what all is going on as well as options for treatment for SB leak.  We did discuss pathology as well.  After discussions of risks/benefits, etc, we have offered the patient return to the OR for abdominal washout with attempt to control this leak with oversew, new drains, etc to see if we can get this to heal to allow a quicker time to treatment for his B-cell lymphoma.  The patient and family are agreeable to move forward.  We discussed that the leak may persist despite surgical intervention, risk for bleeding, infection, worsening leak, or new leak, hernia, post op PNA, CVA, MI, or death.  They all understand and are agreeable to move forward.  We will plan to place retention sutures when we close his wound as well to minimize risk of dehiscence given poor nutrition and anasarca etc.  Patrick [name] 11:58 AM 03/14/2022

## 2022-02-19 NOTE — Anesthesia Procedure Notes (Signed)
Arterial Line Insertion Start/End02/15/2024 2:22 PM, 03/12/2022 2:23 PM Performed by: Genelle Bal, CRNA, CRNA  Patient location: OR. Preanesthetic checklist: patient identified, IV checked, site marked, risks and benefits discussed, surgical consent, monitors and equipment checked, pre-op evaluation, timeout performed and anesthesia consent Left, radial was placed Catheter size: 20 G Hand hygiene performed  and maximum sterile barriers used  Allen's test indicative of satisfactory collateral circulation Attempts: 1 Procedure performed without using ultrasound guided technique. Ultrasound Notes:anatomy identified Following insertion, Biopatch. Post procedure assessment: normal and unchanged  Patient tolerated the procedure well with no immediate complications.

## 2022-02-19 NOTE — Anesthesia Preprocedure Evaluation (Signed)
Anesthesia Evaluation    Reviewed: Allergy & Precautions, Patient's Chart, lab work & pertinent test results  History of Anesthesia Complications (+) PONV and history of anesthetic complications  Airway Mallampati: IV  TM Distance: >3 FB Neck ROM: Full    Dental  (+) Dental Advisory Given, Missing, Poor Dentition   Pulmonary neg pulmonary ROS   Pulmonary exam normal breath sounds clear to auscultation       Cardiovascular + CAD, + CABG and + DVT   Rhythm:Regular Rate:Normal  IMPRESSIONS     1. Left ventricular ejection fraction, by estimation, is 50%. The left  ventricle has mildly decreased function. The left ventricle demonstrates  global hypokinesis with septal bounce suggesting prior cardiac surgery.  Left ventricular diastolic parameters   are consistent with Grade II diastolic dysfunction (pseudonormalization).   2. Peak RV-RA gradient 14 mmHg. Right ventricular systolic function is  normal. The right ventricular size is normal.   3. Left atrial size was moderately dilated.   4. Right atrial size was mildly dilated.   5. The mitral valve is normal in structure. No evidence of mitral valve  regurgitation. No evidence of mitral stenosis.   6. The aortic valve is tricuspid. Aortic valve regurgitation is not  visualized. No aortic stenosis is present.   7. Aortic dilatation noted. There is mild dilatation of the ascending  aorta, measuring 40 mm.   8. Technically difficult study with poor acoustic windows.     Neuro/Psych  PSYCHIATRIC DISORDERS Anxiety     negative neurological ROS     GI/Hepatic negative GI ROS, Neg liver ROS,,,Colostomy, S/P colectomy   Endo/Other  diabetes    Renal/GU ARFRenal disease     Musculoskeletal  (+) Arthritis ,    Abdominal  (+) + obese  Peds  Hematology negative hematology ROS (+) B-cell Lymphoma   Anesthesia Other Findings   Reproductive/Obstetrics                               Anesthesia Physical Anesthesia Plan  ASA: 3  Anesthesia Plan: General   Post-op Pain Management: Tylenol PO (pre-op)* and Gabapentin PO (pre-op)*   Induction: Intravenous  PONV Risk Score and Plan: 4 or greater and Ondansetron, Dexamethasone, Treatment may vary due to age or medical condition and Midazolam  Airway Management Planned: Oral ETT  Additional Equipment:   Intra-op Plan:   Post-operative Plan: Possible Post-op intubation/ventilation  Informed Consent: I have reviewed the patients History and Physical, chart, labs and discussed the procedure including the risks, benefits and alternatives for the proposed anesthesia with the patient or authorized representative who has indicated his/her understanding and acceptance.     Dental advisory given  Plan Discussed with: CRNA  Anesthesia Plan Comments: (2 x PIV, )         Anesthesia Quick Evaluation

## 2022-02-19 NOTE — Anesthesia Procedure Notes (Signed)
Procedure Name: Intubation Date/Time: 03/01/2022 2:20 PM  Performed by: Maude Leriche, CRNAPre-anesthesia Checklist: Patient identified, Emergency Drugs available, Suction available and Patient being monitored Patient Re-evaluated:Patient Re-evaluated prior to induction Oxygen Delivery Method: Circle system utilized Preoxygenation: Pre-oxygenation with 100% oxygen Induction Type: IV induction, Rapid sequence and Cricoid Pressure applied Laryngoscope Size: Miller and 2 Grade View: Grade I Tube type: Oral Tube size: 7.5 mm Number of attempts: 1 Airway Equipment and Method: Stylet Placement Confirmation: ETT inserted through vocal cords under direct vision, positive ETCO2 and breath sounds checked- equal and bilateral Secured at: 22 cm Tube secured with: Tape Dental Injury: Teeth and Oropharynx as per pre-operative assessment

## 2022-02-19 NOTE — Op Note (Signed)
02/16/2022 - 03/10/2022  3:36 PM  PATIENT:  Patrick [name]  52 y.o. male  PRE-OPERATIVE DIAGNOSIS:  bowel perforation  POST-OPERATIVE DIAGNOSIS:  bowel perforation  PROCEDURE:  Procedure(s): EXPLORATORY LAPAROTOMY WITH ABDOMINAL IRRIGATION AND DEBRIDMENT, DRAINAGE INTRAABDOMINAL ABSCESS  SURGEON:  Surgeon(s) and Role:    * Jovita Kussmaul, MD - Primary    * Lovick, Montel Culver, MD - Assisting  PHYSICIAN ASSISTANT:   ASSISTANTS: Dr. Bobbye Morton   ANESTHESIA:   general  EBL:  5 mL   BLOOD ADMINISTERED:none  DRAINS: (2) Blake drain(s) in the paracolic gutter abscesses    LOCAL MEDICATIONS USED:  NONE  SPECIMEN:  No Specimen  DISPOSITION OF SPECIMEN:  N/A  COUNTS:  YES  TOURNIQUET:  * No tourniquets in log *  DICTATION: .Dragon Dictation  After informed consent was obtained the patient was brought to the operating room and placed in the supine position on the operating table.  After adequate induction of general anesthesia the patient's abdomen was prepped with Betadine and draped in usual sterile manner.  An appropriate timeout was performed.  The previous midline incision was reopened after removing the fascial sutures.  There were significant interloop adhesions that were densely stuck.  We were able to free up a small amount of the small intestine without injury.  We were able to identify what appeared to be a small bowel leak in the left upper quadrant but because of the amount of inflammation and contamination we could not definitively identify the bowel Tangredi.  Although there was contamination in the left upper quadrant we could not identify a specific leak.  We were able to identify a large abscess in both paracolic gutters.  At this point there was nothing we can do to control the small bowel leak.  We placed new drains in both paracolic gutters and positioned a third drain over top of the area of maximal contamination.  The abdomen was then irrigated with copious amounts of saline.   The abdomen was then closed with 2 #1 double-stranded looped PDS sutures as well as four #5 Ethibond retention sutures.  The subcutaneous tissue was left open and packed with Kerlix.  Sterile dressings and a new ostomy bag were applied.  The patient tolerated the procedure well.  The patient will likely remain intubated to be taken back to the ICU for further resuscitation measures  PLAN OF CARE: Admit to inpatient   PATIENT DISPOSITION:  ICU - intubated and critically ill.   Delay start of Pharmacological VTE agent (>24hrs) due to surgical blood loss or risk of bleeding: no

## 2022-02-19 NOTE — Progress Notes (Signed)
PT Cancellation Note  Patient Details Name: Patrick [name] MRN: [id number] DOB: 03-16-1970   Cancelled Treatment:    Reason Eval/Treat Not Completed: Patient not medically ready;Medical issues which prohibited therapy.  Going to sx to repair a leak. 03/07/2022  Ginger Carne., PT Acute Rehabilitation Services [phone]  (office)   [name] 03/12/2022, 2:10 PM

## 2022-02-19 NOTE — Progress Notes (Signed)
[facility] Progress Note    52 y.o. male DM, diabetic foot infections including osteomyelitis b/l feet w/ multiple debridements, nephrolithiasis, morbid obesity, CASHD s/p CABG x5, recently received contrast at [facility] 01/29/2022 with CT showing small bowel ischemia with pneumatosis and pneumoperitoneum, repeat CT (without contrast) showed severe colitis as well and possible acute pyelonephritis + pelvic free fluid. Exlap showed a retroperitoneal mass invading into the transverse colon, proximal small bowel with ischemic proximal jejunum with perforation and abscess. Patient had resection of the transverse colon, drainage of the abscess, resection of proximal jejunum with small anastomosis and diverting right colostomy.    Assessment/ Plan:   Renal failure in the setting of surgery for a retroperitoneal mass invading into the transverse colon with no significant drop in MAP during the operation. He was +15L at time of consultation. FeNA less than 1% which can be consistent with heart failure and CRS; hypervolemic on exam at time of consultation. - On Lasix gtt with great response initially but with Levophed transitioned to intermittent dosing. - Initially massively overloaded with great response to IV Lasix gtt but now transitioned to intermittent scheduled dosing with Levophed onboard. He's now only +5.8L. Should be able to transition off Lasix and give PRN in the next 24-48hrs.  Signing off at this time; please reconsult as needed.   -Monitor Daily I/Os, Daily weight  -Maintain MAP>65 for optimal renal perfusion.  - Dose all meds for creatinine clearance < 30 ml/min  - Unless absolutely necessary, no MRIs with gadolinium.  - Prefer needle sticks in the dorsum of the hands or wrists.  No blood pressure measurements in right arm. - Preferred narcotic agents for pain control are hydromorphone, fentanyl, and methadone. Morphine should not be used. Avoid Baclofen and avoid oral sodium  phosphate and magnesium citrate based laxatives / bowel preps. Continue strict Input and Output monitoring. Will monitor the patient closely with you and intervene or adjust therapy as indicated by changes in clinical status/labs      Retroperitoneal mass s/p resection, drainage of abscess as well as CT guided drain of a LUQ abscess on 1/30. Patient currently receiving TPN.  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 TTE shows grade 2 diastolic dysfunction, LVEF 50% w/ global hypokinesis.  DM per primary currently on ISS  Subjective:   Abdominal pain and nonproductive cough but passing gas + feeling better. Denies SOB/ fevers. Levophed 5   Objective:   BP 95/62   Pulse (!) 105   Temp 99.9 F (37.7 C) (Oral)   Resp 20   Ht 6' (1.829 m)   Wt 115.9 kg   SpO2 93%   BMI 34.65 kg/m   Intake/Output Summary (Last 24 hours) at 03/06/2022 1122 Last data filed at 03/11/2022 1100 Gross per 24 hour  Intake 2741.88 ml  Output 8364 ml  Net -5622.12 ml   Weight change: -2.8 kg  Physical Exam: General appearance: alert and oriented x3 Head: NCAT Resp: CTA no rales Cardio: RRR GI: no BS, drains in place Extremities: edema tr, b/l ray amputations Pulses: 2+ and symmetric  Imaging: VAS [place] LOWER EXTREMITY VENOUS (DVT)  Result Date: 03/05/2022  Lower Venous DVT Study Patient Name:  Patrick [name]  Date of Exam:   02/18/2022 Medical Rec #: [id number]     Accession #:    [id number] Date of Birth: 1971-01-17    Patient Gender: M Patient Age:   38 years Exam Location:  [facility] Procedure:  VAS [place] LOWER EXTREMITY VENOUS (DVT) Referring Phys: Terrilee Croak --------------------------------------------------------------------------------  Indications: Swelling, and Edema.  Comparison Study: no prior Performing Technologist: Archie Patten RVS  Examination Guidelines: A complete evaluation includes B-mode imaging, spectral Doppler, color Doppler, and power Doppler as needed of all accessible  portions of each vessel. Bilateral testing is considered an integral part of a complete examination. Limited examinations for reoccurring indications may be performed as noted. The reflux portion of the exam is performed with the patient in reverse Trendelenburg.  +---------+---------------+---------+-----------+----------+-------------------+ RIGHT    CompressibilityPhasicitySpontaneityPropertiesThrombus Aging      +---------+---------------+---------+-----------+----------+-------------------+ CFV      Full           Yes      Yes                                      +---------+---------------+---------+-----------+----------+-------------------+ SFJ      Full                                                             +---------+---------------+---------+-----------+----------+-------------------+ FV Prox  Full                                                             +---------+---------------+---------+-----------+----------+-------------------+ FV Mid   Full                                                             +---------+---------------+---------+-----------+----------+-------------------+ FV DistalFull                                                             +---------+---------------+---------+-----------+----------+-------------------+ PFV      Full                                                             +---------+---------------+---------+-----------+----------+-------------------+ POP      Full           Yes      Yes                                      +---------+---------------+---------+-----------+----------+-------------------+ PTV      Full                                                             +---------+---------------+---------+-----------+----------+-------------------+  PERO                                                  Not well visualized  +---------+---------------+---------+-----------+----------+-------------------+   +---------+---------------+---------+-----------+----------+-------------------+ LEFT     CompressibilityPhasicitySpontaneityPropertiesThrombus Aging      +---------+---------------+---------+-----------+----------+-------------------+ CFV      Full           Yes      Yes                                      +---------+---------------+---------+-----------+----------+-------------------+ SFJ      Full                                                             +---------+---------------+---------+-----------+----------+-------------------+ FV Prox  Full                                                             +---------+---------------+---------+-----------+----------+-------------------+ FV Mid   Full                                                             +---------+---------------+---------+-----------+----------+-------------------+ FV DistalFull                                                             +---------+---------------+---------+-----------+----------+-------------------+ PFV      Full                                                             +---------+---------------+---------+-----------+----------+-------------------+ POP      Full           Yes      Yes                                      +---------+---------------+---------+-----------+----------+-------------------+ PTV      Full                                                             +---------+---------------+---------+-----------+----------+-------------------+ PERO  Not well visualized +---------+---------------+---------+-----------+----------+-------------------+     Summary: BILATERAL: - No evidence of deep vein thrombosis seen in the lower extremities, bilaterally. -No evidence of popliteal cyst, bilaterally.   *See  table(s) above for measurements and observations. Electronically signed by Jamelle Haring on 02/21/2022 at 7:28:21 AM.    Final    DG CHEST PORT 1 VIEW  Result Date: 02/17/2022 CLINICAL DATA:  Hypoxia. EXAM: PORTABLE CHEST 1 VIEW COMPARISON:  February 15, 2022. FINDINGS: Stable cardiomegaly. Status post coronary bypass graft. Stable probable right basilar subsegmental atelectasis. Stable left basilar atelectasis or infiltrate is noted with associated pleural effusion. Bony thorax is unremarkable. IMPRESSION: Stable bilateral lung opacities as noted above. Electronically Signed   By: Marijo Conception M.D.   On: 02/17/2022 11:57    Labs: BMET Recent Labs  Lab 02/13/22 0500 02/13/22 0856 02/14/22 0445 02/15/22 0441 02/16/22 0520 02/17/22 0542 02/17/22 1151 02/18/22 0516 03/08/2022 0651  NA 133* 134* 138 134* 134* 136 138 139 136  K 5.5* 3.7 3.8 3.8 4.2 4.2 4.3 4.0 3.7  CL 102 102 104 101 101 101  --  100 90*  CO2 21* '23 22 25 26 22  '$ --  29 33*  GLUCOSE 435* 150* 180* 210* 209* 192*  --  130* 284*  BUN '14 15 16 '$ 24* 36* 41*  --  42* 39*  CREATININE 0.92 1.05 1.18 1.51* 2.02* 2.25*  --  2.21* 2.10*  CALCIUM 7.4* 7.3* 7.3* 7.3* 7.3* 7.5*  --  8.0* 7.9*  PHOS 4.0 2.5 3.0 2.9  --  3.7  --  4.7* 5.0*   CBC Recent Labs  Lab 02/16/22 0520 02/17/22 0542 02/17/22 1151 02/18/22 0516 03/05/2022 0500  WBC 16.3* 16.9*  --  18.6* 19.3*  HGB 7.4* 6.7* 8.8* 8.0* 8.7*  HCT 24.8* 21.4* 26.0* 24.9* 30.3*  MCV 78.7* 78.4*  --  77.8* 92.9  PLT 389 358  --  411* 497*    Medications:     alteplase  2 mg Intracatheter Once   Chlorhexidine Gluconate Cloth  6 each Topical Daily   enoxaparin (LOVENOX) injection  55 mg Subcutaneous Q24H   furosemide  80 mg Intravenous Once   insulin aspart  0-20 Units Subcutaneous Q4H   phenazopyridine  100 mg Oral TID WC   sodium chloride flush  10-40 mL Intracatheter Q12H   sodium chloride flush  5 mL Intracatheter Q8H      Otelia Santee, MD 03/12/2022, 11:22 AM

## 2022-02-19 NOTE — Progress Notes (Addendum)
Patient ID: Patrick [name], male   DOB: 07-17-1970, 52 y.o.   MRN: [id number] Days Post-Op    Subjective: Passing gas in ostomy.  No nausea.  T max 99.6.  denies SOB.  O2 down 8L today and sats much improved to 97-99%.  Complaining of what sounds like bladder spasms.  Voided close to 8L of urine yesterday.   Objective: Vital signs in last 24 hours: Temp:  [99 F (37.2 C)-99.6 F (37.6 C)] 99.6 F (37.6 C) (02/02 0400) Pulse Rate:  [92-115] 103 (02/02 0800) Resp:  [10-25] 20 (02/02 0800) BP: (92-140)/(45-85) 109/54 (02/02 0800) SpO2:  [80 %-98 %] 96 % (02/02 0800) FiO2 (%):  [60 %] 60 % (02/02 0400) Weight:  [113.6 kg] 113.6 kg (02/02 0500) Last BM Date :  (pta)  Intake/Output from previous day: 02/01 0701 - 02/02 0700 In: 2901.1 [P.O.:240; I.V.:2439.9; IV Piggyback:211.2] Out: 7829 [Urine:8315; Drains:124; Stool:95] Intake/Output this shift: Total I/O In: 137.5 [I.V.:123.8; IV Piggyback:13.7] Out: -   Gen: NAD, actually looks a bit better today Heart: regular, in 90-100s Lungs: decrease BS but improved, O2 8L and sats 97-99% Abd: soft, wound with increase in tan drainage still with base of the wound with fibrin, edges are clean. Colostomy is viable with some liquid output and air.  Surgical drain bright yellow serous fluid.  IR/LUQ drain with enteric contents with aspiration and air filling the bag     GU: foley in place with clear yellow urine today Ext: BLE edema Skin: some jaundice and diffuse anasarca, but much improving Psych: A&Ox3  Lab Results: CBC  Recent Labs    02/18/22 0516 03/08/2022 0500  WBC 18.6* 19.3*  HGB 8.0* 8.7*  HCT 24.9* 30.3*  PLT 411* 497*   BMET Recent Labs    02/18/22 0516 03/10/2022 0651  NA 139 136  K 4.0 3.7  CL 100 90*  CO2 29 33*  GLUCOSE 130* 284*  BUN 42* 39*  CREATININE 2.21* 2.10*  CALCIUM 8.0* 7.9*   PT/INR No results for input(s): "LABPROT", "INR" in the last 72 hours.  ABG Recent Labs    02/17/22 1151  PHART  7.428  HCO3 26.0    Studies/Results: VAS [place] LOWER EXTREMITY VENOUS (DVT)  Result Date: 03/11/2022  Lower Venous DVT Study Patient Name:  Patrick [name]  Date of Exam:   02/18/2022 Medical Rec #: [id number]     Accession #:    [id number] Date of Birth: Aug 24, 1970    Patient Gender: M Patient Age:   56 years Exam Location:  [facility] Procedure:      VAS [place] LOWER EXTREMITY VENOUS (DVT) Referring Phys: Terrilee Croak --------------------------------------------------------------------------------  Indications: Swelling, and Edema.  Comparison Study: no prior Performing Technologist: Archie Patten RVS  Examination Guidelines: A complete evaluation includes B-mode imaging, spectral Doppler, color Doppler, and power Doppler as needed of all accessible portions of each vessel. Bilateral testing is considered an integral part of a complete examination. Limited examinations for reoccurring indications may be performed as noted. The reflux portion of the exam is performed with the patient in reverse Trendelenburg.  +---------+---------------+---------+-----------+----------+-------------------+ RIGHT    CompressibilityPhasicitySpontaneityPropertiesThrombus Aging      +---------+---------------+---------+-----------+----------+-------------------+ CFV      Full           Yes      Yes                                      +---------+---------------+---------+-----------+----------+-------------------+  SFJ      Full                                                             +---------+---------------+---------+-----------+----------+-------------------+ FV Prox  Full                                                             +---------+---------------+---------+-----------+----------+-------------------+ FV Mid   Full                                                             +---------+---------------+---------+-----------+----------+-------------------+ FV DistalFull                                                              +---------+---------------+---------+-----------+----------+-------------------+ PFV      Full                                                             +---------+---------------+---------+-----------+----------+-------------------+ POP      Full           Yes      Yes                                      +---------+---------------+---------+-----------+----------+-------------------+ PTV      Full                                                             +---------+---------------+---------+-----------+----------+-------------------+ PERO                                                  Not well visualized +---------+---------------+---------+-----------+----------+-------------------+   +---------+---------------+---------+-----------+----------+-------------------+ LEFT     CompressibilityPhasicitySpontaneityPropertiesThrombus Aging      +---------+---------------+---------+-----------+----------+-------------------+ CFV      Full           Yes      Yes                                      +---------+---------------+---------+-----------+----------+-------------------+ SFJ      Full                                                             +---------+---------------+---------+-----------+----------+-------------------+  FV Prox  Full                                                             +---------+---------------+---------+-----------+----------+-------------------+ FV Mid   Full                                                             +---------+---------------+---------+-----------+----------+-------------------+ FV DistalFull                                                             +---------+---------------+---------+-----------+----------+-------------------+ PFV      Full                                                              +---------+---------------+---------+-----------+----------+-------------------+ POP      Full           Yes      Yes                                      +---------+---------------+---------+-----------+----------+-------------------+ PTV      Full                                                             +---------+---------------+---------+-----------+----------+-------------------+ PERO                                                  Not well visualized +---------+---------------+---------+-----------+----------+-------------------+     Summary: BILATERAL: - No evidence of deep vein thrombosis seen in the lower extremities, bilaterally. -No evidence of popliteal cyst, bilaterally.   *See table(s) above for measurements and observations. Electronically signed by Jamelle Haring on 02/27/2022 at 7:28:21 AM.    Final    DG CHEST PORT 1 VIEW  Result Date: 02/17/2022 CLINICAL DATA:  Hypoxia. EXAM: PORTABLE CHEST 1 VIEW COMPARISON:  February 15, 2022. FINDINGS: Stable cardiomegaly. Status post coronary bypass graft. Stable probable right basilar subsegmental atelectasis. Stable left basilar atelectasis or infiltrate is noted with associated pleural effusion. Bony thorax is unremarkable. IMPRESSION: Stable bilateral lung opacities as noted above. Electronically Signed   By: Marijo Conception M.D.   On: 02/17/2022 11:57    Anti-infectives: Anti-infectives (From admission, onward)    Start     Dose/Rate Route Frequency Ordered Stop   02/17/22 1200  piperacillin-tazobactam (ZOSYN) IVPB 3.375 g        3.375 g 12.5 mL/hr over 240 Minutes Intravenous Every 8 hours 02/17/22 1103     02/15/22 1500  piperacillin-tazobactam (ZOSYN) IVPB 3.375 g        3.375 g 12.5 mL/hr over 240 Minutes Intravenous Every 8 hours 02/15/22 0836 02/17/22 0217   01/23/2022 1100  piperacillin-tazobactam (ZOSYN) IVPB 3.375 g        3.375 g 100 mL/hr over 30 Minutes Intravenous  Once 02/16/2022 1056 02/13/2022 1230    02/05/2022 0200  piperacillin-tazobactam (ZOSYN) IVPB 3.375 g  Status:  Discontinued        3.375 g 12.5 mL/hr over 240 Minutes Intravenous Every 8 hours 01/29/2022 2322 02/15/22 0836   01/18/2022 1900  piperacillin-tazobactam (ZOSYN) IVPB 3.375 g        3.375 g 100 mL/hr over 30 Minutes Intravenous  Once 01/28/2022 1857 01/31/2022 2012       Assessment/Plan: 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, proximal small bowel with ischemic necrotic proximal jejunum with perforation fecal contamination with abscess  POD 9, S/P Exploratory laparotomy with resection of transverse colon, drainage of mesenteric abscess, resection of proximal jejunum with small bowel anastomosis and 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  - NPO due leak at small bowel anastomosis likely.  Trying to determine surgical plan for this.  ? Return to OR or control with drain.   - will see if IR can change gravity bag to bulb suction as there seems to be a fair amount of leakage with aspiration that is not totally draining in the bag.  Don't want this to stay in abdomen and make him more ill. -WBC up to 19K, but AF in last 24 hrs. - TNA, concentrated - JP surgical serous today - dressing changes to midline wound to help debride some slough and combat drainage, TID - WOC following for new ostomy - cont zosyn - surgical path with large B-cell lymphoma -culture from LUQ drain pending, Gram - rods -PT/OT evals -CBC and CMET daily  AKI - down to 2.10.  DC lasix gtt today to likely wean levo off.  Appreciate nephrology evaluation and assistance.  Continue to follow their recs Fluid overload/hypoalbuminemia - making great improvements.  Only 5L + today.  Will DC lasix gtt to get levo off.  Likely just some lasix pushes for now.  K 3.7 this am. Monitor closely. Cont full rate TNA, but concentrate this.  Echo reviewed and EF preserved but with some hypokinesis.   LLL PNA - pulm toilet. IS.  Noted on CT.  On  zosyn already which should cover this for now.   CAD, s/p CABG - no chest pain, echo as above DM - SSI.  Sugars fairly well controlled Hyperbilirubinemia - unclear etiology.  5.5 today.  Slowly downtrending. Gallstones noted on CT, but no ductal dilatation.  Other LFTs essentially normal.  Continue to monitor.  Hep panel negative. Chronic osteomyelitis of left foot - s/p amputation ABL anemia - 2 units prbc given 1/31, hgb 8.7 today.   monitor  This patient's labs, imaging, vitals, I/O, chart have all been reviewed along with notes from medical team, nephrology, IR, and nursing.      FEN: NPO x ice/TNA (concentrate this) VTE: Lovenox ID: Zosyn 1/23>>    Attempted to call wife, Santiago Glad, with no answer.  Will try to speak to her when she comes to visit today.  [phone]   LOS: 10 days  Henreitta Cea PA-C 8:24 AM 03/17/2022  Use [email or web address] to contact on call provider  02/21/2022

## 2022-02-19 NOTE — Progress Notes (Signed)
NAME:  Patrick [name], MRN:  [id number], DOB:  [birth year], LOS: 70 ADMISSION DATE:  02/08/2022, CONSULTATION DATE:  01/27/2022 REFERRING MD:  Edison Pace, CHIEF COMPLAINT:  Post op Hypotension    History of Present Illness:  Patrick [name]. Meinhardt is a 52 y.o. 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 Of note, he was seen by his PCP for severe abdominal pain x4 weeks that has increasingly progressed.   Patient was seen and evaluated by surgical team in which they believe on exam CT that he did not have free perforation/pneumoperitoneum/or peritoneal signs throughout the abdomen, however, did show an intra-abdominal mass measuring almost 15 cm at the level of the transverse colon splenic flexure.  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 IVF resuscitation and antibiotics started.   On 01/25/2022, patient was taken to the OR for Exploratory laparotomy with resection of transverse colon, drainage of mesenteric abscess, resection of proximal jejunum with small bowel anastomosis and 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. Post operatively patient developed hypotension requiring vasopressors.   PCCM consulted for post operative hypotension management.   Pertinent  Medical History   Past Medical History:  Diagnosis Date   Anxiety    Arthritis    CAD (coronary artery disease)    s/p quad CABG   Chronic osteomyelitis of left foot ([place]) 12/20/2018   Chronic osteomyelitis of right foot ([place]) 12/20/2018   Diabetes mellitus without complication ([place])    Diabetic foot infection ([place]) 12/20/2018   Diabetic neuropathy ([place]) 12/20/2018   History of kidney stones     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   Morbid obesity ([place]) 12/20/2018   MSSA (methicillin susceptible Staphylococcus aureus) infection 12/20/2018   PONV (postoperative nausea and vomiting)     Significant Hospital Events: Including procedures, antibiotic start and stop dates in addition to other pertinent events   01/22/2022 - Admitted, IVF resuscitation and antibiotics 01/27/2022 - Exploratory Lap with resection of transverse colon, drainage of mesenteric abscess, resection of proximal jejunum with SB anastamosis and diverting right colostomy and long Harmans pouch. 1/26 PCCM called back due to tachypnea and borderline BP's, deemed stable and [place] again signed off  2/2 taken back to surgery for exploratory lap and abdominal I&D with possible bowel resection however unable to complete resection abdominal washout completed.  Postoperatively patient returned back to ICU on ventilator, PCCM reengaged  Interim History / Subjective:  As above  Objective   Blood pressure 108/66, pulse 99, temperature 98.9 F (37.2 C), temperature source Oral, resp. rate 20, height 6' (1.829 m), weight 115.9 kg, SpO2 97 %.    FiO2 (%):  [60 %] 60 %   Intake/Output Summary (Last 24 hours) at 02/21/2022 1443 Last data filed at 03/06/2022 1300 Gross per 24 hour  Intake 2652.24 ml  Output 8229 ml  Net -5576.76 ml    Filed Weights   02/18/22 0500 03/17/2022 0500 03/13/2022 0919  Weight: 116.4 kg 113.6 kg 115.9 kg   Physical Examination: General: Acute on chronically ill appearing elderly male lying in bed on mechanical ventilation, in NAD HEENT: ETT, MM pink/moist, PERRL,  Neuro: Will open eyes to verbal stimuli  CV: s1s2  regular rate and rhythm, no murmur, rubs, or gallops,  PULM:  Clear to ascultation, no increased work of breathing, no added breath sounds  GI: soft, bowel sounds active in all 4 quadrants, non-tender, non-distended, tolerating TPN, colostomy  Extremities: warm/dry, no edema   Skin: no rashes or lesions   Assessment & Plan:   Postoperative ventilator management -Taken back to surgery for exploratory lap and abdominal I&D with possible bowel resection however unable to complete resection abdominal washout completed.  Postoperatively patient returned back to ICU on ventilator, PCCM reengaged P: Continue ventilator support with lung protective strategies  Wean PEEP and FiO2 for sats greater than 90%. Head of bed elevated 30 degrees. Plateau pressures less than 30 cm H20.  Follow intermittent chest x-ray and ABG.   SAT/SBT as tolerated, mentation preclude extubation  Ensure adequate pulmonary hygiene  Follow cultures  VAP bundle in place  PAD protocol  Evolving sepsis in the setting of intra-abdominal infection in the setting of small bowel versus leak -General surgery able to complete abdominal washout 2/2 -Wound culture with few E. coli P: Continues to need close monitoring in ICU setting Vent support as above Continue IV Zosyn Continue pressors for MAP< 65  Retroperitoneal mass -With invasion of the transverse colon with proximal small bowel and ischemic necrotic proximal jejunum with associated perforation and abdominal abscesses P: Primary management per general surgery Continue send Local wound care  Acute kidney injury  -Creatinine currently 2.10, GFR 36, renal function on admission P: Hold further diuretics Follow renal function  Monitor urine output Trend Bmet Avoid nephrotoxins Ensure adequate renal perfusion   Mild systolic congestive heart failure -Echocardiogram 1/29 revealed EF 50% with global hypokinesis and septal bounce suggestive of prior cardiac surgery and grade 2 diastolic dysfunction History of CAD, s/p CABG x5 P: Continuous telemetry  Strict intake and output  Daily weight to assess volume status Daily assessment for need to diurese Closely monitor renal function and electrolytes  Ensure hemodynamic control  (antihypertensives to promote adequate blood pressure in  Chronic osteomyelitis of both feet  Hypoalbuminemia At risk malnutrition P: Protein supplementation as able Continue TPN  Type 2 diabetes P: Continue SSI CBG goal 140-180  Acute blood loss anemia P: Trend CBC Hemoglobin goal greater than 7 Transfuse per protocol  High-grade B-cell lymphoma P Supportive care   Best Practice (right click and "Reselect all SmartList Selections" daily)   Diet/type: NPO DVT prophylaxis: LMWH GI prophylaxis: N/A Lines: N/A Foley:  Yes, and it is still needed Code Status:  full code Last date of multidisciplinary goals of care discussion '[]'$   Critical care time:    CRITICAL CARE Performed by: Tymar Polyak D. Harris   Total critical care time: 40 minutes  Critical care time was exclusive of separately billable procedures and treating other patients.  Critical care was necessary to treat or prevent imminent or life-threatening deterioration.  Critical care was time spent personally by me on the following activities: development of treatment plan with patient and/or surrogate as well as nursing, discussions with consultants, evaluation of patient's response to treatment, examination of patient, obtaining history from patient or surrogate, ordering and performing treatments and interventions, ordering and review of laboratory studies, ordering and review of radiographic studies, pulse oximetry and re-evaluation of patient's condition.

## 2022-02-19 NOTE — Anesthesia Postprocedure Evaluation (Signed)
Anesthesia Post Note  Patient: Patrick [name]  Procedure(s) Performed: EXPLORATORY LAPAROTOMY WITH ABDOMINAL IRRIGATION AND DEBRIDMENT (Abdomen)     Patient location during evaluation: SICU Anesthesia Type: General Level of consciousness: sedated Pain management: pain level controlled Vital Signs Assessment: post-procedure vital signs reviewed and stable Respiratory status: patient remains intubated per anesthesia plan Cardiovascular status: stable Postop Assessment: no apparent nausea or vomiting Anesthetic complications: no  No notable events documented.  Last Vitals:  Vitals:   02/18/2022 1300 03/07/2022 1614  BP: 108/66 (!) 106/34  Pulse: 99 (!) 117  Resp: 20 17  Temp:    SpO2: 97%     Last Pain:  Vitals:   02/27/2022 1354  TempSrc:   PainSc: [place]

## 2022-02-19 NOTE — Progress Notes (Signed)
Trauma/Critical Care Follow Up Note  Subjective:    Overnight Issues:   Objective:  Vital signs for last 24 hours: Temp:  [99 F (37.2 C)-99.9 F (37.7 C)] 99.9 F (37.7 C) (02/02 0800) Pulse Rate:  [92-115] 96 (02/02 0900) Resp:  [10-25] 16 (02/02 0900) BP: (92-140)/(45-85) 101/56 (02/02 0900) SpO2:  [80 %-99 %] 99 % (02/02 0900) FiO2 (%):  [60 %] 60 % (02/02 0400) Weight:  [113.6 kg-115.9 kg] 115.9 kg (02/02 0919)  Hemodynamic parameters for last 24 hours:    Intake/Output from previous day: 02/01 0701 - 02/02 0700 In: 2901.1 [P.O.:240; I.V.:2439.9; IV Piggyback:211.2] Out: 8534 [Urine:8315; Drains:124; Stool:95]  Intake/Output this shift: Total I/O In: 137.4 [I.V.:123.8; IV Piggyback:13.6] Out: 300 [Urine:300]  Vent settings for last 24 hours: FiO2 (%):  [60 %] 60 %  Physical Exam:  Gen: comfortable, no distress Neuro: non-focal exam HEENT: PERRL Neck: supple CV: RRR Pulm: unlabored breathing Abd: soft, NT, L drain with air in bag GU: clear yellow urine, foley Extr: wwp, 2+ edema   Results for orders placed or performed during the hospital encounter of 01/18/2022 (from the past 24 hour(s))  Glucose, capillary     Status: Abnormal   Collection Time: 02/18/22 11:29 AM  Result Value Ref Range   Glucose-Capillary 133 (H) 70 - 99 mg/dL  Glucose, capillary     Status: Abnormal   Collection Time: 02/18/22  3:01 PM  Result Value Ref Range   Glucose-Capillary 137 (H) 70 - 99 mg/dL  Glucose, capillary     Status: Abnormal   Collection Time: 02/18/22  7:47 PM  Result Value Ref Range   Glucose-Capillary 166 (H) 70 - 99 mg/dL  Glucose, capillary     Status: Abnormal   Collection Time: 02/18/22 11:36 PM  Result Value Ref Range   Glucose-Capillary 220 (H) 70 - 99 mg/dL  Glucose, capillary     Status: Abnormal   Collection Time: 03/05/2022  3:38 AM  Result Value Ref Range   Glucose-Capillary 156 (H) 70 - 99 mg/dL  CBC     Status: Abnormal   Collection Time:  03/06/2022  5:00 AM  Result Value Ref Range   WBC 19.3 (H) 4.0 - 10.5 K/uL   RBC 3.26 (L) 4.22 - 5.81 MIL/uL   Hemoglobin 8.7 (L) 13.0 - 17.0 g/dL   HCT 30.3 (L) 39.0 - 52.0 %   MCV 92.9 80.0 - 100.0 fL   MCH 26.7 26.0 - 34.0 pg   MCHC 28.7 (L) 30.0 - 36.0 g/dL   RDW 20.0 (H) 11.5 - 15.5 %   Platelets 497 (H) 150 - 400 K/uL   nRBC 0.0 0.0 - 0.2 %  Comprehensive metabolic panel     Status: Abnormal   Collection Time: 03/08/2022  6:51 AM  Result Value Ref Range   Sodium 136 135 - 145 mmol/L   Potassium 3.7 3.5 - 5.1 mmol/L   Chloride 90 (L) 98 - 111 mmol/L   CO2 33 (H) 22 - 32 mmol/L   Glucose, Bld 284 (H) 70 - 99 mg/dL   BUN 39 (H) 6 - 20 mg/dL   Creatinine, Ser 2.10 (H) 0.61 - 1.24 mg/dL   Calcium 7.9 (L) 8.9 - 10.3 mg/dL   Total Protein 5.8 (L) 6.5 - 8.1 g/dL   Albumin 1.9 (L) 3.5 - 5.0 g/dL   AST 33 15 - 41 U/L   ALT 16 0 - 44 U/L   Alkaline Phosphatase 115 38 - 126 U/L  Total Bilirubin 5.5 (H) 0.3 - 1.2 mg/dL   GFR, Estimated 38 (L) >60 mL/min   Anion gap 13 5 - 15  Magnesium     Status: None   Collection Time: 02/20/2022  6:51 AM  Result Value Ref Range   Magnesium 1.8 1.7 - 2.4 mg/dL  Phosphorus     Status: Abnormal   Collection Time: 03/06/2022  6:51 AM  Result Value Ref Range   Phosphorus 5.0 (H) 2.5 - 4.6 mg/dL  Glucose, capillary     Status: Abnormal   Collection Time: 03/17/2022  8:41 AM  Result Value Ref Range   Glucose-Capillary 161 (H) 70 - 99 mg/dL    Assessment & Plan: The plan of care was discussed with the bedside nurse for the day, who is in agreement with this plan and no additional concerns were raised.   Present on Admission:  Abdominal mass    LOS: 10 days   Additional comments:I reviewed the patient's new clinical lab test results.   and I reviewed the patients new imaging test results.    61M with retroperitoneal mass   RP mass - POD9 s/p exploratory laparotomy with resection of transverse colon, drainage of mesenteric abscess, resection of  proximal jejunum with small bowel anastomosis and 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. Path c/w high grade B cell lymphoma. Suspect anastomotic leak at SB, decision pending re: takeback to OR vs controlled fistula. Volume overload and pulmonary edema - diuresed well with lasix gtt, but on levo at 5 this AM, will d/c gtt and transition to q6 dosing AKI - suspect element of relative hypotension felt by the kidneys, levo prn to goal MAP of 70. Avoid nephrotoxic agents.  Hyperbilirubinemia - unclear etiology, mostly conjugated FEN - NPO, limit volume admin, replete hypomagnesemia and K DVT - SQH, LMWH  Dispo - ICU   Critical care time: 56mn   Rutger Salton N. Dahl Higinbotham, MD Trauma & General Surgery Please use [email or web address] to contact on call provider  02/18/2022  *Care during the described time interval was provided by me. I have reviewed this patient's available data, including medical history, events of note, physical examination and test results as part of my evaluation.

## 2022-02-19 NOTE — H&P (View-Only) (Signed)
Long discussion with patient, wife, and son at the bedside giving updates on what all is going on as well as options for treatment for SB leak.  We did discuss pathology as well.  After discussions of risks/benefits, etc, we have offered the patient return to the OR for abdominal washout with attempt to control this leak with oversew, new drains, etc to see if we can get this to heal to allow a quicker time to treatment for his B-cell lymphoma.  The patient and family are agreeable to move forward.  We discussed that the leak may persist despite surgical intervention, risk for bleeding, infection, worsening leak, or new leak, hernia, post op PNA, CVA, MI, or death.  They all understand and are agreeable to move forward.  We will plan to place retention sutures when we close his wound as well to minimize risk of dehiscence given poor nutrition and anasarca etc.  Henreitta Cea 11:58 AM 02/20/2022

## 2022-02-20 ENCOUNTER — Encounter (HOSPITAL_COMMUNITY): Payer: Self-pay | Admitting: General Surgery

## 2022-02-20 DIAGNOSIS — A419 Sepsis, unspecified organism: Secondary | ICD-10-CM | POA: Diagnosis not present

## 2022-02-20 DIAGNOSIS — F05 Delirium due to known physiological condition: Secondary | ICD-10-CM | POA: Insufficient documentation

## 2022-02-20 DIAGNOSIS — K559 Vascular disorder of intestine, unspecified: Secondary | ICD-10-CM | POA: Diagnosis not present

## 2022-02-20 DIAGNOSIS — R6521 Severe sepsis with septic shock: Secondary | ICD-10-CM | POA: Diagnosis not present

## 2022-02-20 DIAGNOSIS — R451 Restlessness and agitation: Secondary | ICD-10-CM

## 2022-02-20 LAB — COMPREHENSIVE METABOLIC PANEL
ALT: 18 U/L (ref 0–44)
AST: 29 U/L (ref 15–41)
Albumin: 1.8 g/dL — ABNORMAL LOW (ref 3.5–5.0)
Alkaline Phosphatase: 97 U/L (ref 38–126)
Anion gap: 16 — ABNORMAL HIGH (ref 5–15)
BUN: 48 mg/dL — ABNORMAL HIGH (ref 6–20)
CO2: 32 mmol/L (ref 22–32)
Calcium: 8 mg/dL — ABNORMAL LOW (ref 8.9–10.3)
Chloride: 92 mmol/L — ABNORMAL LOW (ref 98–111)
Creatinine, Ser: 2.2 mg/dL — ABNORMAL HIGH (ref 0.61–1.24)
GFR, Estimated: 36 mL/min — ABNORMAL LOW (ref >60)
Glucose, Bld: 279 mg/dL — ABNORMAL HIGH (ref 70–99)
Potassium: 4.6 mmol/L (ref 3.5–5.1)
Sodium: 140 mmol/L (ref 135–145)
Total Bilirubin: 4.6 mg/dL — ABNORMAL HIGH (ref 0.3–1.2)
Total Protein: 6 g/dL — ABNORMAL LOW (ref 6.5–8.1)

## 2022-02-20 LAB — GLUCOSE, CAPILLARY
Glucose-Capillary: 309 mg/dL — ABNORMAL HIGH (ref 70–99)
Glucose-Capillary: 236 mg/dL — ABNORMAL HIGH (ref 70–99)
Glucose-Capillary: 225 mg/dL — ABNORMAL HIGH (ref 70–99)
Glucose-Capillary: 217 mg/dL — ABNORMAL HIGH (ref 70–99)
Glucose-Capillary: 215 mg/dL — ABNORMAL HIGH (ref 70–99)

## 2022-02-20 LAB — HEMOGLOBIN AND HEMATOCRIT, BLOOD
HCT: 32.1 % — ABNORMAL LOW (ref 39.0–52.0)
Hemoglobin: 10.2 g/dL — ABNORMAL LOW (ref 13.0–17.0)

## 2022-02-20 LAB — CBC
HCT: 31.1 % — ABNORMAL LOW (ref 39.0–52.0)
Hemoglobin: 9.9 g/dL — ABNORMAL LOW (ref 13.0–17.0)
MCH: 25 pg — ABNORMAL LOW (ref 26.0–34.0)
MCHC: 31.8 g/dL (ref 30.0–36.0)
MCV: 78.5 fL — ABNORMAL LOW (ref 80.0–100.0)
Platelets: 614 10*3/uL — ABNORMAL HIGH (ref 150–400)
RBC: 3.96 MIL/uL — ABNORMAL LOW (ref 4.22–5.81)
RDW: 18.9 % — ABNORMAL HIGH (ref 11.5–15.5)
WBC: 21.4 10*3/uL — ABNORMAL HIGH (ref 4.0–10.5)
nRBC: 0 % (ref 0.0–0.2)

## 2022-02-20 LAB — CULTURE, BLOOD (ROUTINE X 2)
Culture: NO GROWTH
Culture: NO GROWTH
Special Requests: ADEQUATE
Special Requests: ADEQUATE

## 2022-02-20 LAB — PHOSPHORUS: Phosphorus: 5.1 mg/dL — ABNORMAL HIGH (ref 2.5–4.6)

## 2022-02-20 LAB — MAGNESIUM: Magnesium: 2.2 mg/dL (ref 1.7–2.4)

## 2022-02-20 LAB — TRIGLYCERIDES: Triglycerides: 277 mg/dL — ABNORMAL HIGH (ref <150)

## 2022-02-20 MED ORDER — OLANZAPINE 5 MG PO TBDP
5.0000 mg | ORAL_TABLET | Freq: Two times a day (BID) | ORAL | Status: DC
  Administered 2022-02-20: 5 mg via ORAL
  Filled 2022-02-20 (×2): qty 1

## 2022-02-20 MED ORDER — OLANZAPINE 5 MG PO TABS
5.0000 mg | ORAL_TABLET | Freq: Two times a day (BID) | ORAL | Status: DC
  Administered 2022-02-20: 5 mg
  Filled 2022-02-20 (×2): qty 1

## 2022-02-20 MED ORDER — TRACE MINERALS CU-MN-SE-ZN 300-55-60-3000 MCG/ML IV SOLN
INTRAVENOUS | Status: AC
  Filled 2022-02-20: qty 883.2

## 2022-02-20 MED ORDER — DEXMEDETOMIDINE HCL IN NACL 400 MCG/100ML IV SOLN
0.0000 ug/kg/h | INTRAVENOUS | Status: DC
  Administered 2022-02-20: 0.4 ug/kg/h via INTRAVENOUS
  Administered 2022-02-20: 0.1 ug/kg/h via INTRAVENOUS
  Filled 2022-02-20: qty 100

## 2022-02-20 MED ORDER — OLANZAPINE 10 MG IM SOLR: 5.0000 mg | Freq: Three times a day (TID) | INTRAMUSCULAR | Status: DC | PRN

## 2022-02-20 MED ORDER — SIMETHICONE 40 MG/0.6ML PO SUSP: 40.0000 mg | Freq: Four times a day (QID) | ORAL | Status: DC | PRN

## 2022-02-20 MED ORDER — ORAL CARE MOUTH RINSE: 15.0000 mL | OROMUCOSAL | Status: DC | PRN

## 2022-02-20 MED ORDER — DEXMEDETOMIDINE HCL IN NACL 400 MCG/100ML IV SOLN
INTRAVENOUS | Status: AC
  Filled 2022-02-20: qty 100

## 2022-02-20 NOTE — Progress Notes (Signed)
   02/20/22 0030 02/20/22 0035 02/20/22 0040  Art Line  Arterial Line BP 69/31 96/46 176/72  Arterial Line MAP (mmHg) 41 mmHg 60 mmHg 100 mmHg    02/20/22 0045 02/20/22 0050  Art Line  Arterial Line BP 150/62 126/53  Arterial Line MAP (mmHg) 86 mmHg 73 mmHg   Episode of hypotension after 0000 1G dose of IV acetaminophen. Arterial line troubleshot, cuff pressures cycled, and active norepinephrine titration from 13 mcg up to 25 mcg took place to achieve SBP and MAP goals (>100, >70). Norepinephrine now back down to 13 mcg. CCM and Trauma RN notified of situation and RN interventions. Sent H&H to assess and POC is to notify eLink RN and TRN with results and H&H.

## 2022-02-20 NOTE — Progress Notes (Signed)
PHARMACY - TOTAL PARENTERAL NUTRITION CONSULT NOTE  Indication: Prolonged ileus  Patient Measurements: Height: 6' (182.9 cm) Weight: 115.2 kg (253 lb 15.5 oz) IBW/kg (Calculated) : 77.6 TPN AdjBW (KG): 86.6 Body mass index is 34.44 kg/m.  Assessment:  48 YOM presented with abdominal pain for 4 weeks, found to have retroperitoneal mass with necrotic jejunum, perforated fecal contamination and abscess. 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 Patient has been NPO for 4 days since admission. Patient/wife report he wasn't eating well for 4 weeks PTA and basically on a liquid diet. He endorses a ~10 lb weight loss during this time. Previously, he typically ate 2 meals a day (chicken, vegetables, beans and fruits) and 1 snack. Pharmacy consulted to dose TPN.  Glucose / Insulin: hx DM (A1C 6.6) on glipizide PTA.  Received 41 units SSI in past 24 hrs, 50 units in TPN Decadron IV x1 given preop 2/2 Electrolytes: K: 3.7>> 4.6, Mag: 2.2  (goal >= 2) , Phos: 5.1, all others WNL  Renal: AKI - SCr up to 2.2 (BL 1), BUN up 48, s/p lasix gtt 2/1-2/2 AM, lasix x 1 on 2/2 PM  Hepatic: LFTs WNL, tbili down to 4.6, TG 277 (propofol postop 2/2>>2/3), albumin 1.8 S/p thiamine x 3 days (1/26>1/28) Intake / Output; MIVF: UOP 1.19m/kg/hr, drain 6969m colostomy 30m68mnet +5.3L  GI Imaging:  1/23 CT abd pelvis - Marked severity colitis with adjacent abscess  1/29 CT abd pelvis - suspicious for abscesses and PNA, small pleural effusion, anasarca GI Surgeries / Procedures:  1/24 s/p ex lap with resection of transverse colon, drainage of mesenteric abscess, resection of proximal jejunum with SB anastomosis and diverting R colostomy and long Hartman's pouch 1/30 IR per drain placement 2/2 ex lap w/ I&D of abdominal abscess, 2 abdominal drains placed  Central access:  PICC 02/12/22 TPN start date: 02/12/22  Nutritional Goals:  RD Estimated Needs Total Energy Estimated Needs: 2300-2500 Total Protein Estimated Needs: 120-135 grams Total Fluid Estimated Needs: >2 L/day   Current Nutrition:  TPN NPO  Plan:  Continue concentrated TPN at goal rate 80 ml/hr to provide 132g AA, 355g CHO and 2324 kCal, meeting 100% of needs Electrolytes in TPN: Na 125m2m, reduce K 45mE75mempirically due to incr Scr, Ca 3mEq/4mMg 5mEq/L53memove Phos; Cl:Ac 1:2.  Add standard MVI to TPN T-bili improved < 6. Jaundice improved but continue 1/2 trace elements until resolved, continue to hold chromium for now but consider adding back on 2/3 pending Scr trend.  Increase insulin in TPN cautiously to 55 units (anticipate some improvement with hyperglycemia as Decadron wears off and while patient remains NPO) Continue resistant SSI Q4H  Monitor TPN labs daily until stable, then standard on Mon/Thurs.  Monitor TG (ILE 25% of total kCal). Anticipate some degree of TG improvement with stopping propofol.  Patrick [name], BCPS 02/20/2022 7:04 AM

## 2022-02-20 NOTE — Progress Notes (Signed)
Attempted x3 times to place NG tube patient coughing it back up. Patient request not to continue will notify MD

## 2022-02-20 NOTE — Progress Notes (Signed)
NAME:  LENNYN [name], MRN:  [id number], DOB:  12-11-1970, LOS: 35 ADMISSION DATE:  02/04/2022, CONSULTATION DATE:  02/02/2022 REFERRING MD:  Edison Pace, CHIEF COMPLAINT:  Post op Hypotension    History of Present Illness:  Patrick [name]. Pilar is a 52 y.o. Male with a significant PMH of DM with s/p of Left transmetatarsal amputation secondary to osteomyelitis, CAD s/p CABG with Exclusion of left atrial appendage (2022), arthritis, obesity, and anxiety, who presented to [facility] on 02/16/2022 after outpatient CT scan which resulted with questionable pneumatosis and possible pneumoperitoneum. Of note, he was seen by his PCP for severe abdominal pain x4 weeks that has increasingly progressed.   Patient was seen and evaluated by surgical team in which they believe on exam CT that he did not have free perforation/pneumoperitoneum/or peritoneal signs throughout the abdomen, however, did show an intra-abdominal mass measuring almost 15 cm at the level of the transverse colon splenic flexure.  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 IVF resuscitation and antibiotics started.   On 01/24/2022, patient was taken to the OR for Exploratory laparotomy with resection of transverse colon, drainage of mesenteric abscess, resection of proximal jejunum with small bowel anastomosis and 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. Post operatively patient developed hypotension requiring vasopressors.   PCCM consulted for post operative hypotension management.   Pertinent  Medical History   Past Medical History:  Diagnosis Date   Anxiety    Arthritis    CAD (coronary artery disease)    s/p quad CABG   Chronic osteomyelitis of left foot ([place]) 12/20/2018   Chronic osteomyelitis of right foot ([place]) 12/20/2018   Diabetes mellitus without complication ([place])    Diabetic foot infection ([place]) 12/20/2018   Diabetic neuropathy ([place]) 12/20/2018   History of kidney stones     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   Morbid obesity ([place]) 12/20/2018   MSSA (methicillin susceptible Staphylococcus aureus) infection 12/20/2018   PONV (postoperative nausea and vomiting)     Significant Hospital Events: Including procedures, antibiotic start and stop dates in addition to other pertinent events   02/16/2022 - Admitted, IVF resuscitation and antibiotics 01/18/2022 - Exploratory Lap with resection of transverse colon, drainage of mesenteric abscess, resection of proximal jejunum with SB anastamosis and diverting right colostomy and long Harmans pouch. 1/26 PCCM called back due to tachypnea and borderline BP's, deemed stable and [place] again signed off  2/2 taken back to surgery for exploratory lap and abdominal I&D with possible bowel resection however unable to complete resection abdominal washout completed.  Postoperatively patient returned back to ICU on ventilator, PCCM reengaged 2/3 No acute issues overnight, weaning on vent this am   Interim History / Subjective:  Alert and interactive on vent Tolerating SBT  Objective   Blood pressure 122/74, pulse 87, temperature 99.1 F (37.3 C), temperature source Oral, resp. rate (!) 23, height 6' (1.829 m), weight 115.2 kg, SpO2 96 %.    Vent Mode: PSV;CPAP FiO2 (%):  [40 %] 40 % Set Rate:  [16 bmp] 16 bmp Vt Set:  [620 mL] 620 mL PEEP:  [5 cmH20] 5 cmH20 Pressure Support:  [5 cmH20] 5 cmH20 Plateau Pressure:  [12 cmH20-22 cmH20] 12 cmH20   Intake/Output Summary (Last 24 hours) at 02/20/2022 0903 Last data filed at 02/20/2022 0758 Gross per 24 hour  Intake 3491.2 ml  Output 4319 ml  Net -827.8 ml  Filed Weights   02/27/2022 0500 02/20/2022 0919 02/20/22 0500  Weight: 113.6 kg 115.9 kg 115.2 kg   Physical Examination: General: Acute on chronically ill appearing middle aged male lying in bed on mechanical ventilation, in NAD HEENT: ETT, MM pink/moist, PERRL,  Neuro: Alert and interactive on vent  CV: s1s2  regular rate and rhythm, no murmur, rubs, or gallops,  PULM:  Clear to auscultation, no increased work of breathing, no added breath sounds GI: soft, bowel sounds active in all 4 quadrants, non-tender, non-distended, tolerating TPN  Extremities: warm/dry, generalized  edema  Skin: no rashes or lesions  Assessment & Plan:   Postoperative ventilator management -Taken back to surgery for exploratory lap and abdominal I&D with possible bowel resection however unable to complete resection abdominal washout completed.  Postoperatively patient returned back to ICU on ventilator, PCCM reengaged P: Continue ventilator support with lung protective strategies  Wean PEEP and FiO2 for sats greater than 90%. Head of bed elevated 30 degrees. Plateau pressures less than 30 cm H20.  Follow intermittent chest x-ray and ABG.   SAT/SBT as tolerated, mentation preclude extubation  Ensure adequate pulmonary hygiene  Follow cultures  VAP bundle in place  PAD protocol  Evolving sepsis in the setting of intra-abdominal infection in the setting of small bowel versus leak -General surgery able to complete abdominal washout 2/2 but due to adhesions, inflammation, and significant contamination they were unable to identify small bowel leak. -Wound culture with few E. Coli Chronic bilateral osteomyelitis to feet P: Continue IV Zosyn Continue drains per general surgery Continue pressors for map goal greater than 65 Close monitoring of renal function Vent support as above  Retroperitoneal mass -With invasion of the transverse colon with proximal small bowel and ischemic necrotic proximal jejunum with associated perforation and abdominal abscesses P: Primary management per general surgery Continue local wound care Pain control  Acute kidney injury  -Creatinine currently 2.10, GFR 36, renal function on admission P: Daily assessment for need for continued diuresing Monitor urine output Follow renal  function Trend [place] Avoid nephrotoxins  Mild systolic congestive heart failure -Echocardiogram 1/29 revealed EF 50% with global hypokinesis and septal bounce suggestive of prior cardiac surgery and grade 2 diastolic dysfunction History of CAD, s/p CABG x5 P: Continuous telemetry Strict intake and output Daily weight Daily assessment for need for continued diuresing Ensure hemodynamic control  Hypoalbuminemia At risk malnutrition P: Continue TPN Protein supplementation as able  Type 2 diabetes P: Continue SSI CBG goal 140-180  Acute blood loss anemia P: Trend CBC Hemoglobin goal greater than 7 Transfuse per protocol  High-grade B-cell lymphoma P Supportive care    Best Practice (right click and "Reselect all SmartList Selections" daily)   Diet/type: NPO DVT prophylaxis: LMWH GI prophylaxis: N/A Lines: N/A Foley:  Yes, and it is still needed Code Status:  full code Last date of multidisciplinary goals of care discussion: Per primary   Critical care time:    CRITICAL CARE Performed by: Broderic Bara D. Harris   Total critical care time: 38  minutes  Critical care time was exclusive of separately billable procedures and treating other patients.  Critical care was necessary to treat or prevent imminent or life-threatening deterioration.  Critical care was time spent personally by me on the following activities: development of treatment plan with patient and/or surrogate as well as nursing, discussions with consultants, evaluation of patient's response to treatment, examination of patient, obtaining history from patient or surrogate, ordering and performing treatments and interventions,  ordering and review of laboratory studies, ordering and review of radiographic studies, pulse oximetry and re-evaluation of patient's condition.  Alfonso Carden D. Harris, NP-C [facility] Personal contact information can be found on Amion  If no contact or response  made please call [phone] 02/20/2022, 9:15 AM

## 2022-02-20 NOTE — Progress Notes (Signed)
Patient ID: Patrick [name], male   DOB: 02-12-1970, 52 y.o.   MRN: [id number] 1 Day Post-Op    Subjective: Ostomy with sweat since surgery, no flatus/effluent yet.  No nausea.   Objective: Vital signs in last 24 hours: Temp:  [98.3 F (36.8 C)-99.4 F (37.4 C)] 99.1 F (37.3 C) (02/03 0800) Pulse Rate:  [76-117] 87 (02/03 0800) Resp:  [12-24] 15 (02/03 0900) BP: (70-155)/(34-90) 118/73 (02/03 0900) SpO2:  [90 %-100 %] 96 % (02/03 0800) Arterial Line BP: (69-179)/(31-76) 134/60 (02/03 0900) FiO2 (%):  [40 %] 40 % (02/03 0739) Weight:  [115.2 kg] 115.2 kg (02/03 0500) Last BM Date :  (pta)  Intake/Output from previous day: 02/02 0701 - 02/03 0700 In: 3680.6 [I.V.:3122.9; IV Piggyback:557.7] Out: 1027 [Urine:3760; Emesis/NG output:180; Drains:694; Blood:5] Intake/Output this shift: Total I/O In: 221.7 [I.V.:221.7] Out: 110 [Urine:110]  Gen: NAD, actually looks a bit better today Heart: regular, in 90-100s Abd: soft, wound with increase in tan drainage still with base of the wound with fibrin, edges are clean. Ostomy pink with sweat in appliance.. 3 drains - 2 right sided with serosang; left sided bilious GU: foley in place with clear yellow urine today Ext: BLE edema Skin: some jaundice and diffuse anasarca Psych: A&Ox3  Lab Results: CBC  Recent Labs    03/03/2022 0500 02/18/2022 2002 02/20/22 0044  WBC 19.3*  --  21.4*  HGB 8.7* 11.2* 9.9*  10.2*  HCT 30.3* 33.0* 31.1*  32.1*  PLT 497*  --  614*   BMET Recent Labs    03/14/2022 0651 02/24/2022 2002 02/20/22 0044  NA 136 137 140  K 3.7 4.5 4.6  CL 90*  --  92*  CO2 33*  --  32  GLUCOSE 284*  --  279*  BUN 39*  --  48*  CREATININE 2.10*  --  2.20*  CALCIUM 7.9*  --  8.0*   PT/INR No results for input(s): "LABPROT", "INR" in the last 72 hours.  ABG Recent Labs    02/17/22 1151 02/26/2022 2002  PHART 7.428 7.444  HCO3 26.0 35.6*    Studies/Results: DG Abd 1 View  Result Date: 03/07/2022 CLINICAL DATA:  NG  tube EXAM: ABDOMEN - 1 VIEW COMPARISON:  Abdominal x-ray 02/06/2022 FINDINGS: Nasogastric tube tip is at the level of the gastric body. No dilated bowel loops are seen. Sternotomy wires are present. There is left basilar atelectasis. IMPRESSION: Nasogastric tube tip is at the level of the gastric body. Electronically Signed   By: Ronney Asters M.D.   On: 03/17/2022 18:06   VAS [place] LOWER EXTREMITY VENOUS (DVT)  Result Date: 03/05/2022  Lower Venous DVT Study Patient Name:  JONTAE ADEBAYO Bastos  Date of Exam:   02/18/2022 Medical Rec #: [id number]     Accession #:    [id number] Date of Birth: Aug 04, 1970    Patient Gender: M Patient Age:   28 years Exam Location:  [facility] Procedure:      VAS [place] LOWER EXTREMITY VENOUS (DVT) Referring Phys: Terrilee Croak --------------------------------------------------------------------------------  Indications: Swelling, and Edema.  Comparison Study: no prior Performing Technologist: Archie Patten RVS  Examination Guidelines: A complete evaluation includes B-mode imaging, spectral Doppler, color Doppler, and power Doppler as needed of all accessible portions of each vessel. Bilateral testing is considered an integral part of a complete examination. Limited examinations for reoccurring indications may be performed as noted. The reflux portion of the exam is performed with the patient in reverse Trendelenburg.  +---------+---------------+---------+-----------+----------+-------------------+  RIGHT    CompressibilityPhasicitySpontaneityPropertiesThrombus Aging      +---------+---------------+---------+-----------+----------+-------------------+ CFV      Full           Yes      Yes                                      +---------+---------------+---------+-----------+----------+-------------------+ SFJ      Full                                                             +---------+---------------+---------+-----------+----------+-------------------+ FV Prox  Full                                                              +---------+---------------+---------+-----------+----------+-------------------+ FV Mid   Full                                                             +---------+---------------+---------+-----------+----------+-------------------+ FV DistalFull                                                             +---------+---------------+---------+-----------+----------+-------------------+ PFV      Full                                                             +---------+---------------+---------+-----------+----------+-------------------+ POP      Full           Yes      Yes                                      +---------+---------------+---------+-----------+----------+-------------------+ PTV      Full                                                             +---------+---------------+---------+-----------+----------+-------------------+ PERO                                                  Not well visualized +---------+---------------+---------+-----------+----------+-------------------+   +---------+---------------+---------+-----------+----------+-------------------+ LEFT     CompressibilityPhasicitySpontaneityPropertiesThrombus Aging      +---------+---------------+---------+-----------+----------+-------------------+  CFV      Full           Yes      Yes                                      +---------+---------------+---------+-----------+----------+-------------------+ SFJ      Full                                                             +---------+---------------+---------+-----------+----------+-------------------+ FV Prox  Full                                                             +---------+---------------+---------+-----------+----------+-------------------+ FV Mid   Full                                                              +---------+---------------+---------+-----------+----------+-------------------+ FV DistalFull                                                             +---------+---------------+---------+-----------+----------+-------------------+ PFV      Full                                                             +---------+---------------+---------+-----------+----------+-------------------+ POP      Full           Yes      Yes                                      +---------+---------------+---------+-----------+----------+-------------------+ PTV      Full                                                             +---------+---------------+---------+-----------+----------+-------------------+ PERO                                                  Not well visualized +---------+---------------+---------+-----------+----------+-------------------+     Summary: BILATERAL: - No evidence of deep vein thrombosis seen in the lower extremities, bilaterally. -No evidence of popliteal  cyst, bilaterally.   *See table(s) above for measurements and observations. Electronically signed by Jamelle Haring on 03/13/2022 at 7:28:21 AM.    Final     Anti-infectives: Anti-infectives (From admission, onward)    Start     Dose/Rate Route Frequency Ordered Stop   02/17/22 1200  piperacillin-tazobactam (ZOSYN) IVPB 3.375 g        3.375 g 12.5 mL/hr over 240 Minutes Intravenous Every 8 hours 02/17/22 1103     02/15/22 1500  piperacillin-tazobactam (ZOSYN) IVPB 3.375 g        3.375 g 12.5 mL/hr over 240 Minutes Intravenous Every 8 hours 02/15/22 0836 02/17/22 0217   01/27/2022 1100  piperacillin-tazobactam (ZOSYN) IVPB 3.375 g        3.375 g 100 mL/hr over 30 Minutes Intravenous  Once 01/31/2022 1056 01/19/2022 1230   02/08/2022 0200  piperacillin-tazobactam (ZOSYN) IVPB 3.375 g  Status:  Discontinued        3.375 g 12.5 mL/hr over 240 Minutes Intravenous Every 8 hours 01/21/2022 2322 02/15/22 0836    02/11/2022 1900  piperacillin-tazobactam (ZOSYN) IVPB 3.375 g        3.375 g 100 mL/hr over 30 Minutes Intravenous  Once 01/28/2022 1857 01/28/2022 2012       Assessment/Plan: 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, proximal small bowel with ischemic necrotic proximal jejunum with perforation fecal contamination with abscess  POD 9, S/P Exploratory laparotomy with resection of transverse colon, drainage of mesenteric abscess, resection of proximal jejunum with small bowel anastomosis and 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  - NPO due leak at small bowel anastomosis likely.  Trying to determine surgical plan for this.  ? Return to OR or control with drain.   - will see if IR can change gravity bag to bulb suction as there seems to be a fair amount of leakage with aspiration that is not totally draining in the bag.  Don't want this to stay in abdomen and make him more ill. -WBC up to 19K, but AF in last 24 hrs. - TNA, concentrated - JP surgical serous today - dressing changes to midline wound to help debride some slough and combat drainage, TID - WOC following for new ostomy - cont zosyn - surgical path with large B-cell lymphoma -culture from LUQ drain pending, Gram - rods -PT/OT evals -CBC and CMET daily  AKI - down to 2.10.  DC lasix gtt today to likely wean levo off.  Appreciate nephrology evaluation and assistance.  Continue to follow their recs Fluid overload/hypoalbuminemia - making great improvements.  Only 5L + today.  Will DC lasix gtt to get levo off.  Likely just some lasix pushes for now.  K 3.7 this am. Monitor closely. Cont full rate TNA, but concentrate this.  Echo reviewed and EF preserved but with some hypokinesis.   LLL PNA - pulm toilet. IS.  Noted on CT.  On zosyn already which should cover this for now.   CAD, s/p CABG - no chest pain, echo as above DM - SSI.  Sugars fairly well controlled Hyperbilirubinemia - unclear etiology but  suspect related to bowel leak and reabsorption across peritoneum.  Gallstones noted on CT, but no ductal dilatation.  Other LFTs essentially normal.  Continue to monitor.  Hep panel negative. Improved s/p washout and addn'l drainage.  Chronic osteomyelitis of left foot - s/p amputation ABL anemia - 2 units prbc given 1/31, hgb stable.   monitor  FEN: NPO, TPN VTE: Lovenox  ID: Zosyn 1/23>>   Dispo: ICU today; if continues to do well, transfer to floor tomorrow    CRITICAL CARE Performed by: Ileana Roup   Total critical care time: 40 minutes  Critical care time was exclusive of separately billable procedures and treating other patients.  Critical care was necessary to treat or prevent imminent or life-threatening deterioration.  Critical care was time spent personally by me on the following activities: development of treatment plan with patient and/or surrogate as well as nursing, discussions with consultants, evaluation of patient's response to treatment, examination of patient, obtaining history from patient or surrogate, ordering and performing treatments and interventions, ordering and review of laboratory studies, ordering and review of radiographic studies, pulse oximetry and re-evaluation of patient's condition.    LOS: 11 days   Nadeen Landau, MD [facility], [facility]  02/20/2022

## 2022-02-20 NOTE — Procedures (Signed)
Extubation Procedure Note  Patient Details:   Name: Patrick [name] DOB: 1970/04/13 MRN: [id number]   Airway Documentation:    Vent end date: 02/20/22 Vent end time: 0942   Evaluation  O2 sats: stable throughout Complications: No apparent complications Patient did tolerate procedure well. Bilateral Breath Sounds: Clear, Diminished   Yes  Pt states he is in "[place], [place], [place]."  [place], [place] 02/20/2022, 9:43 AM

## 2022-02-20 NOTE — Consult Note (Signed)
[facility] New Face-to-Face Psychiatric Evaluation   Service Date: February 20, 2022 LOS:  LOS: 11 days    Assessment  Patrick [name] is a 52 y.o. male admitted medically for 01/31/2022  4:49 PM for severe abdominal pain. 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   Consult was initiated after patient got agitated with the staff trying to place a catheter, during his agitation incident patient was noted to be threatening harm to staff and to himself, also was noted to be confused, staff reported this is the first time he got agitated threatening to harm to self or others since he has been in the hospital.  He was a started on Precedex currently at 0.6 mcg on 2/3 to address agitation.  His current presentation is most consistent with delirium secondary to medical problems and recent surgeries.  He denies SI HI or AVH.  No psychotropic medications at home were reported or during this hospitalization prior to evaluation.    Diagnoses:  Active Hospital problems: Principal Problem:   Abdominal mass Active Problems:   Protein-calorie malnutrition, severe   Ischemic bowel disease (HCC)   Pressure injury of skin   Delirium due to another medical condition     Plan  ## Safety and Observation Level:  - Based on my clinical evaluation, I estimate the patient to be at minimal risk of self harm in the current setting - At this time, we do not recommend any changes in level of observation. This decision is based on my review of the chart including patient's history and current presentation, interview of the patient, mental status examination, and  consideration of suicide risk including evaluating suicidal ideation, plan, intent, suicidal or self-harm behaviors, risk factors, and protective factors. This judgment is based on our ability to directly address suicide risk, implement suicide prevention strategies and develop a safety plan while the patient is in the clinical setting. Please contact our team if there is a concern that risk level has changed.   ## Medications:  Patient is n.p.o., discussed with primary treating provider will start on Zyprexa Zydis as noted below scheduled twice daily and monitor to taper down gradually.   Start Zyprexa Zydis 5 mg twice daily to address confusion and agitation, taper down gradually in the next few days.  Start Zyprexa 5 mg every 8 hours IM as needed for breakthrough psychotic agitation  Using Zyprexa scheduled and as needed, recommend tapering down Precedex gradually and discontinue.  ## Medical Decision Making Capacity:  Given on and off episodes of confusion, in my opinion patient does not have capacity to make medical decisions at this time.  Please note patient's capacity to make decisions is expected to improve as delirium and confusion improves.  We will continue to assess during follow-up.  ## Further Work-up:  --None at this time   -- most recent EKG on 1/24 had QtC of 434 -- Pertinent labwork reviewed.  ## Disposition:  --Per primary team  ## Behavioral / Environmental:  -- At this time, the patient's presentation is most consistent with delirium, most likely due to multiple etiologies including but not limited to infection, medications, pain, altered sleep/wake cycle, and limited mobility. During this time period, minimization of  delirogenic insults will be of utmost importance; this includes promoting the normal circadian cycle, minimizing lines/tubes, avoiding deliriogenic medications such as benzodiazepines and anticholinergic medications, and frequently reorienting the patient.  Symptomatic treatment for agitation can be provided by antipsychotic medications, though it is important to remember that these do not treat the underlying etiology of delirium. Notably, there can be a time lag effect between treatment of a medical problem and resolution of delirium. This time lag effect may be of longer duration in the elderly, and those with underlying cognitive impairment or brain injury.    Thank you for this consult request. Recommendations have been communicated to the primary team.  We will follow-up at this time.   Marg Macmaster Winfred Leeds, MD   NEW vs followup history  Relevant Aspects of Hospital Course:  Admitted on 01/28/2022 for severe abdominal pain.  On initial examination, patient presents alert and partially oriented, oriented to time except confusing the day of the week Friday instead of Saturday, he answers orientation question with some delay related to confusion.  He is able to provide history information with some limitation secondary to confusion, mother is at bedside assisting him recalling history information and agreeing that he is presently confused compared to his baseline.  Patient denies any history of diagnosed mental illness, denies any symptoms consistent with depression mania or hypomania, denies any passive or active SI intention or plan, denies HI or AVH.  Patient admits to feeling confused earlier today during the incident described by staff that he got agitated and threatening harm to self and others but he cannot recall any of these threats "I remember I was confused I do not remember I said the staff I apologize" mother also agrees that making this statement is not him at all.  Mother reports that last week after surgery he had 2 days of confusions on and off when he was talking occasionally out of his head but no agitation during these episodes.  Discussed with patient and his mother diagnosis of delirium with plan of care as noted below. Please see plan below  for detailed recommendations.    Collateral information:  Were obtained from patient's mother as noted above  Psychiatric History:  Information collected from patient and mother, patient does not have any history of diagnosed mental illness or psychiatric hospitalization or suicide attempts.  No history of psychotropic medication treatment recently or in the past.  Family psych history: None reported   Social History:  Married twice, current marriage for 3 years, no children Works as a area Physiological scientist at [employer] use: Denies Alcohol use: Rarely, denies any history of blackouts or withdrawals Drug use: Denies any illicit drug use currently or in the past  Medical History: Past Medical History:  Diagnosis Date   Anxiety    Arthritis    CAD (coronary artery disease)    s/p quad CABG   Chronic osteomyelitis of left foot ([place]) 12/20/2018   Chronic osteomyelitis of right foot ([place]) 12/20/2018   Diabetes mellitus without complication ([place])    Diabetic foot infection ([place]) 12/20/2018   Diabetic neuropathy ([place]) 12/20/2018   History of kidney stones    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   Morbid obesity ([place]) 12/20/2018   MSSA (methicillin susceptible Staphylococcus aureus) infection 12/20/2018   PONV (postoperative nausea and vomiting)     Surgical History: Past Surgical History:  Procedure Laterality Date   AMPUTATION Right 05/10/2018   Procedure: PARTIAL 5TH RAY AMPUTATION  RIGHT FOOT;  Surgeon: Caprice Beaver, DPM;  Location: [facility];  Service: Podiatry;  Laterality: Right;   AMPUTATION Left 10/26/2018   Procedure: PARTIAL 1ST RAY AMPUTATION FOOT;  Surgeon: Caprice Beaver, DPM;  Location: [facility];  Service: Podiatry;  Laterality: Left;   APPLICATION OF WOUND VAC Right 05/10/2018   Procedure: APPLICATION OF WOUND VAC RIGHT FOOT;  Surgeon: Caprice Beaver, DPM;  Location: [facility];  Service: Podiatry;  Laterality: Right;   APPLICATION  OF WOUND VAC  02/11/2022   Procedure: APPLICATION OF WOUND VAC;  Surgeon: Erroll Luna, MD;  Location: [facility];  Service: General;;   COLECTOMY WITH COLOSTOMY CREATION/HARTMANN PROCEDURE N/A 02/17/2022   ex-lap, resection of transverse colon with long Hartman's pouch, drainage of mesenteric abscess, resection of proximal jejunum with anastomosis - Dr. Brantley Stage   CORONARY ARTERY BYPASS GRAFT  04/01/2020   CABG x 5 (LIMA-LAD, RCA-PDA, VG-Diag, VG-OM, SVG-RPL) and LAA Clip at [facility] in [place] with Sande Brothers, MD   CORONARY ARTERY BYPASS GRAFT     x4   LAPAROTOMY N/A 01/23/2022   Procedure: EXPLORATORY LAPAROTOMY WITH BOWEL RESECTIOM;  Surgeon: Erroll Luna, MD;  Location: [place];  Service: General;  Laterality: N/A;   VASECTOMY     WOUND DEBRIDEMENT Right 05/10/2018   Procedure: DEBRIDEMENT PUNCTURE WOUND;  Surgeon: Caprice Beaver, DPM;  Location: [facility];  Service: Podiatry;  Laterality: Right;   WOUND DEBRIDEMENT Left 05/10/2018   Procedure: DEBRIDEMENT ULCERATION LEFT FOOT;  Surgeon: Caprice Beaver, DPM;  Location: [facility];  Service: Podiatry;  Laterality: Left;   WOUND DEBRIDEMENT Left 10/26/2018   Procedure: DEBRIDEMENT ULCER LEFT FOOT;  Surgeon: Caprice Beaver, DPM;  Location: [facility];  Service: Podiatry;  Laterality: Left;    Medications:   Current Facility-Administered Medications:    0.9 %  sodium chloride infusion, , Intravenous, PRN, Saverio Danker, PA-C, Stopped at 03/04/2022 1214   acetaminophen (OFIRMEV) IV 1,000 mg, 1,000 mg, Intravenous, Q6H, Saverio Danker, PA-C, Stopped at 02/20/22 [phone]   alteplase (CATHFLO ACTIVASE) injection 2 mg, 2 mg, Intracatheter, Once, Saverio Danker, PA-C   Chlorhexidine Gluconate Cloth 2 % PADS 6 each, 6 each, Topical, Daily, Saverio Danker, PA-C, 6 each at 03/13/2022 1058   dexmedetomidine (PRECEDEX) 400 MCG/100ML (4 mcg/mL) infusion, 0-1.2 mcg/kg/hr, Intravenous, Titrated, Harris, Whitney D, NP, Last Rate: 11.52 mL/hr at 02/20/22  1025, 0.4 mcg/kg/hr at 02/20/22 1025   dexmedetomidine (PRECEDEX) 400 MCG/100ML (4 mcg/mL) infusion, , , ,    docusate (COLACE) 50 MG/5ML liquid 100 mg, 100 mg, Per Tube, BID, Harris, Whitney D, NP, 100 mg at 02/26/2022 2206   enoxaparin (LOVENOX) injection 55 mg, 55 mg, Subcutaneous, Q24H, Saverio Danker, PA-C, 55 mg at 03/10/2022 2206   HYDROmorphone (DILAUDID) injection 1-2 mg, 1-2 mg, Intravenous, Q2H PRN, Saverio Danker, PA-C, 1 mg at 03/16/2022 1327   insulin aspart (novoLOG) injection 0-20 Units, 0-20 Units, Subcutaneous, Q4H, Saverio Danker, PA-C, 7 Units at 02/20/22 1258   norepinephrine (LEVOPHED) 16 mg in 229m (0.064 mg/mL) premix infusion, 0-40 mcg/min, Intravenous, Titrated, Harris, Whitney D, NP, Last Rate: 10.31 mL/hr at 02/20/22 0614, 11 mcg/min at 02/20/22 0614   ondansetron (ZOFRAN) injection 4 mg, 4 mg, Intravenous, Q6H PRN, OSaverio Danker PA-C, 4 mg at 02/17/22 0247   Oral care mouth rinse, 15 mL, Mouth Rinse, PRN, OSaverio Danker PA-C   Oral care mouth rinse, 15 mL, Mouth Rinse, Q2H, TGeorganna Skeans MD, 15 mL at 02/20/22 0801   Oral care mouth rinse, 15 mL, Mouth Rinse, PRN, TGrandville Silos  Lavone Neri, MD   piperacillin-tazobactam (ZOSYN) IVPB 3.375 g, 3.375 g, Intravenous, Q8H, Saverio Danker, PA-C, Last Rate: 12.5 mL/hr at 02/20/22 1133, 3.375 g at 02/20/22 1133   prochlorperazine (COMPAZINE) injection 10 mg, 10 mg, Intravenous, Q4H PRN, Saverio Danker, PA-C, 10 mg at 01/29/2022 0539   simethicone (MYLICON) 40 HR/4.1UL suspension 40 mg, 40 mg, Oral, QID PRN, Saverio Danker, PA-C, 40 mg at 02/18/22 1332   sodium chloride flush (NS) 0.9 % injection 10-40 mL, 10-40 mL, Intracatheter, Q12H, Saverio Danker, PA-C, 20 mL at 02/26/2022 2159   sodium chloride flush (NS) 0.9 % injection 10-40 mL, 10-40 mL, Intracatheter, PRN, Saverio Danker, PA-C   sodium chloride flush (NS) 0.9 % injection 5 mL, 5 mL, Intracatheter, Q8H, Saverio Danker, PA-C, 5 mL at 02/20/22 0559   TPN ADULT (ION), , Intravenous,  Continuous TPN, Saverio Danker, PA-C, Last Rate: 80 mL/hr at 02/20/22 0900, Infusion Verify at 02/20/22 0900   TPN ADULT (ION), , Intravenous, Continuous TPN, Dimple Nanas, RPH  Allergies: Allergies  Allergen Reactions   Morphine     hallucinations       Objective  Vital signs:  Temp:  [97.9 F (36.6 C)-99.4 F (37.4 C)] 97.9 F (36.6 C) (02/03 1200) Pulse Rate:  [76-117] 87 (02/03 0800) Resp:  [12-24] 15 (02/03 0900) BP: (70-155)/(34-90) 118/73 (02/03 0900) SpO2:  [94 %-100 %] 96 % (02/03 0800) Arterial Line BP: (69-179)/(31-76) 134/60 (02/03 0900) FiO2 (%):  [40 %] 40 % (02/03 0739) Weight:  [115.2 kg] 115.2 kg (02/03 0500)  Psychiatric Specialty Exam:  Presentation  General Appearance: Casual  Eye Contact:Minimal  Speech:Normal Rate  Speech Volume:Decreased  Handedness:No data recorded  Mood and Affect  Mood:Euthymic  Affect:Congruent   Thought Process  Thought Processes:Coherent; Other (comment) (Occasionally appears confused)  Descriptions of Associations:Circumstantial  Orientation:Partial  Thought Content:Logical; Tangential  History of Schizophrenia/Schizoaffective disorder:No data recorded Duration of Psychotic Symptoms:No data recorded Hallucinations:Hallucinations: None  Ideas of Reference:None  Suicidal Thoughts:Suicidal Thoughts: No  Homicidal Thoughts:Homicidal Thoughts: No   Sensorium  Memory:Immediate Poor; Remote Poor  Judgment:Poor  Insight:Poor   Executive Functions  Concentration:-- (Easily distracted)  Attention Span:Poor  Recall:-- (Limited)  Fund of Knowledge:-- (Limited)  Language:Fair   Psychomotor Activity  Psychomotor Activity:Psychomotor Activity: Decreased   Assets  Assets:No data recorded  Sleep  Sleep:Sleep: Fair    Physical Exam: Physical Exam Vitals and nursing note reviewed.    ROS Blood pressure 118/73, pulse 87, temperature 97.9 F (36.6 C), temperature source Oral, resp.  rate 15, height 6' (1.829 m), weight 115.2 kg, SpO2 96 %. Body mass index is 34.44 kg/m.

## 2022-02-20 NOTE — Progress Notes (Deleted)
Patient has had labile b/p. Stroke MD in to see pt multiple times. Patient on and off cleviprex and levophed see MAR. MD aware

## 2022-02-21 DIAGNOSIS — F05 Delirium due to known physiological condition: Secondary | ICD-10-CM

## 2022-02-21 DIAGNOSIS — A419 Sepsis, unspecified organism: Secondary | ICD-10-CM | POA: Diagnosis not present

## 2022-02-21 DIAGNOSIS — R6521 Severe sepsis with septic shock: Secondary | ICD-10-CM | POA: Diagnosis not present

## 2022-02-21 LAB — COMPREHENSIVE METABOLIC PANEL
ALT: 25 U/L (ref 0–44)
AST: 47 U/L — ABNORMAL HIGH (ref 15–41)
Albumin: 1.7 g/dL — ABNORMAL LOW (ref 3.5–5.0)
Alkaline Phosphatase: 182 U/L — ABNORMAL HIGH (ref 38–126)
Anion gap: 12 (ref 5–15)
BUN: 51 mg/dL — ABNORMAL HIGH (ref 6–20)
CO2: 32 mmol/L (ref 22–32)
Calcium: 8 mg/dL — ABNORMAL LOW (ref 8.9–10.3)
Chloride: 99 mmol/L (ref 98–111)
Creatinine, Ser: 2.14 mg/dL — ABNORMAL HIGH (ref 0.61–1.24)
GFR, Estimated: 37 mL/min — ABNORMAL LOW (ref >60)
Glucose, Bld: 180 mg/dL — ABNORMAL HIGH (ref 70–99)
Potassium: 5.1 mmol/L (ref 3.5–5.1)
Sodium: 143 mmol/L (ref 135–145)
Total Bilirubin: 4.8 mg/dL — ABNORMAL HIGH (ref 0.3–1.2)
Total Protein: 6 g/dL — ABNORMAL LOW (ref 6.5–8.1)

## 2022-02-21 LAB — CBC
HCT: 30.4 % — ABNORMAL LOW (ref 39.0–52.0)
Hemoglobin: 9.2 g/dL — ABNORMAL LOW (ref 13.0–17.0)
MCH: 24.1 pg — ABNORMAL LOW (ref 26.0–34.0)
MCHC: 30.3 g/dL (ref 30.0–36.0)
MCV: 79.8 fL — ABNORMAL LOW (ref 80.0–100.0)
Platelets: 711 10*3/uL — ABNORMAL HIGH (ref 150–400)
RBC: 3.81 MIL/uL — ABNORMAL LOW (ref 4.22–5.81)
RDW: 19.2 % — ABNORMAL HIGH (ref 11.5–15.5)
WBC: 16.8 10*3/uL — ABNORMAL HIGH (ref 4.0–10.5)
nRBC: 0 % (ref 0.0–0.2)

## 2022-02-21 LAB — BODY FLUID CULTURE W GRAM STAIN

## 2022-02-21 LAB — GLUCOSE, CAPILLARY
Glucose-Capillary: 165 mg/dL — ABNORMAL HIGH (ref 70–99)
Glucose-Capillary: 133 mg/dL — ABNORMAL HIGH (ref 70–99)
Glucose-Capillary: 152 mg/dL — ABNORMAL HIGH (ref 70–99)
Glucose-Capillary: 115 mg/dL — ABNORMAL HIGH (ref 70–99)
Glucose-Capillary: 156 mg/dL — ABNORMAL HIGH (ref 70–99)
Glucose-Capillary: 133 mg/dL — ABNORMAL HIGH (ref 70–99)

## 2022-02-21 LAB — MAGNESIUM: Magnesium: 2.2 mg/dL (ref 1.7–2.4)

## 2022-02-21 LAB — PHOSPHORUS: Phosphorus: 2.5 mg/dL (ref 2.5–4.6)

## 2022-02-21 LAB — LACTIC ACID, PLASMA
Lactic Acid, Venous: 2.1 mmol/L (ref 0.5–1.9)
Lactic Acid, Venous: 1.9 mmol/L (ref 0.5–1.9)

## 2022-02-21 LAB — UREA NITROGEN, URINE: Urea Nitrogen, Ur: 261 mg/dL

## 2022-02-21 MED ORDER — VANCOMYCIN HCL 2000 MG/400ML IV SOLN
2000.0000 mg | Freq: Once | INTRAVENOUS | Status: AC
  Administered 2022-02-21: 2000 mg via INTRAVENOUS
  Filled 2022-02-21: qty 400

## 2022-02-21 MED ORDER — TRAVASOL 10 % IV SOLN
INTRAVENOUS | Status: AC
  Filled 2022-02-21: qty 1320

## 2022-02-21 MED ORDER — OLANZAPINE 5 MG PO TBDP
5.0000 mg | ORAL_TABLET | Freq: Two times a day (BID) | ORAL | Status: DC
  Administered 2022-02-21: 5 mg via ORAL
  Filled 2022-02-21 (×2): qty 1

## 2022-02-21 MED ORDER — PANTOPRAZOLE SODIUM 40 MG IV SOLR
40.0000 mg | INTRAVENOUS | Status: DC
  Administered 2022-02-21 – 2022-03-01 (×9): 40 mg via INTRAVENOUS
  Filled 2022-02-21 (×9): qty 10

## 2022-02-21 MED ORDER — SODIUM CHLORIDE 0.9 % IV SOLN
150.0000 mg | INTRAVENOUS | Status: DC
  Administered 2022-02-21 – 2022-02-23 (×3): 150 mg via INTRAVENOUS
  Filled 2022-02-21 (×4): qty 7.5

## 2022-02-21 MED ORDER — ACETAMINOPHEN 650 MG RE SUPP
650.0000 mg | Freq: Three times a day (TID) | RECTAL | Status: AC | PRN
  Administered 2022-02-21 – 2022-02-22 (×3): 650 mg via RECTAL
  Filled 2022-02-21 (×2): qty 1

## 2022-02-21 MED ORDER — LACTATED RINGERS IV BOLUS
1000.0000 mL | Freq: Once | INTRAVENOUS | Status: AC
  Administered 2022-02-21: 1000 mL via INTRAVENOUS

## 2022-02-21 MED ORDER — LOPERAMIDE HCL 1 MG/7.5ML PO SUSP
2.0000 mg | Freq: Three times a day (TID) | ORAL | Status: DC
  Administered 2022-02-21 – 2022-02-23 (×7): 2 mg via ORAL
  Filled 2022-02-21 (×6): qty 15

## 2022-02-21 MED ORDER — VANCOMYCIN HCL 1250 MG/250ML IV SOLN
1250.0000 mg | INTRAVENOUS | Status: DC
  Administered 2022-02-22: 1250 mg via INTRAVENOUS
  Filled 2022-02-21 (×2): qty 250

## 2022-02-21 MED ORDER — LACTATED RINGERS IV SOLN: INTRAVENOUS | Status: DC

## 2022-02-21 MED ORDER — OLANZAPINE 5 MG PO TBDP
5.0000 mg | ORAL_TABLET | Freq: Every day | ORAL | Status: DC
  Administered 2022-02-21: 5 mg via ORAL
  Filled 2022-02-21 (×2): qty 1

## 2022-02-21 NOTE — Progress Notes (Signed)
Pharmacy Antibiotic Note  Patrick [name] is a 53 y.o. male admitted on 01/30/2022 with abdominal pain found to have large mass.  Pharmacy has been consulted for vancomycin dosing.  Plan: Vancomycin '2000mg'$  IV x1 then '1250mg'$  IV q24 hours (eAUC 530) Zosyn 3.375 IV q8 hours   Height: 6' (182.9 cm) Weight: 115.2 kg (253 lb 15.5 oz) IBW/kg (Calculated) : 77.6  Temp (24hrs), Avg:99.5 F (37.5 C), Min:97.9 F (36.6 C), Max:102.3 F (39.1 C)  Recent Labs  Lab 02/16/22 0520 02/17/22 0542 02/18/22 0516 02/20/2022 0500 02/24/2022 0651 02/20/22 0044  WBC 16.3* 16.9* 18.6* QUESTIONABLE RESULTS, RECOMMEND RECOLLECT TO VERIFY  --  21.4*  CREATININE 2.02* 2.25* 2.21*  --  2.10* 2.20*    Estimated Creatinine Clearance: 52.6 mL/min (A) (by C-G formula based on SCr of 2.2 mg/dL (H)).    Allergies  Allergen Reactions   Morphine     hallucinations    Antimicrobials this admission: Zosyn 1/23>> Vancomycin 2/4>>  Microbiology results: 1/31 ABDOMINAL DRAINAGE: Escherichia coli & Enterococcus faecalis  Thank you for allowing pharmacy to be a part of this patient's care.  Patrick [name] 02/21/2022 2:33 AM

## 2022-02-21 NOTE — Progress Notes (Addendum)
[facility] Progress Note Patient Name: AMONI MORALES DOB: 04/19/1970 MRN: [id number]   Date of Service  02/21/2022  HPI/Events of Note  temp 102,   last blood cultures on 1/29, currently on Zosyn,   NPO, was just extubated yesterday at noon time.   on TPN,  PICC        BSRN was asking about tylenol.   Camera: Sinus tachycardia. MAP > 65, on levophed at 4 mcg/min. Making urine.  On nasal o2. Awake and alert On precedex 0.4  POD 10 th ex lap. Has 2 JP dain, right.  Blood cultures so far no growth. On zosyn. Since 23 rd. Increasing wbc slowly to 18 K. New JP drain 2 nd one from yesterday culture showing enterococcus and e coli.  - will start Vancomycin, consider ID consultation in AM for any risk for fungal sepsis.  - tylenol PR suppository once. NPO.  - KVO. - not on fluids. Start fluids LR 75 ml/hr. LR bolus 1000 ml. Get a LA, EF 50% on ECHO - blood culture x 2  AKI LLL Pneumonia CAD DM   eICU Interventions  As above.      Intervention Category Intermediate Interventions: Other:  Elmer Sow 02/21/2022, 2:05 AM  4:04 AM lactic 2.1, received fluids 108/74  HR 124, received tylenol. Wbc count improving. Hg 9.2,   Co2 at 32.   Continue care  5;35 JP drain -increasing bilious, dark ? Stool drainage. Advised to notify surgery team.

## 2022-02-21 NOTE — Consult Note (Signed)
Patrick [name] follow-up Face-to-Face Psychiatric Evaluation   Service Date: February 21, 2022 LOS:  LOS: 12 days    Assessment  Patrick [name] is a 52 y.o. male admitted medically for 02/13/2022  4:49 PM for severe abdominal pain. 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   Consult was initiated after patient got agitated with the staff trying to place a catheter, during his agitation incident patient was noted to be threatening harm to staff and to himself, also was noted to be confused, staff reported this is the first time he got agitated threatening to harm to self or others since he has been in the hospital.  He was a started on Precedex currently at 0.6 mcg on 2/3 to address agitation.  His current presentation is most consistent with delirium secondary to medical problems and recent surgeries.  He denies SI HI or AVH.  No psychotropic medications at home were reported or during this hospitalization prior to evaluation.    Diagnoses:  Active Hospital problems: Principal Problem:   Abdominal mass Active Problems:   Protein-calorie malnutrition, severe   Ischemic bowel disease (HCC)   Pressure injury of skin   Delirium due to another medical condition     Plan  ## Safety and Observation Level:  - Based on my clinical evaluation, I estimate the patient to be at minimal risk of self harm in the current setting - At this time, we do not recommend any changes in level of observation. This decision is based on my review of the chart including patient's history and current presentation, interview of the patient, mental status examination, and  consideration of suicide risk including evaluating suicidal ideation, plan, intent, suicidal or self-harm behaviors, risk factors, and protective factors. This judgment is based on our ability to directly address suicide risk, implement suicide prevention strategies and develop a safety plan while the patient is in the clinical setting. Please contact our team if there is a concern that risk level has changed.   ## Medications:  Patient is n.p.o., discussed with primary treating provider will start on Zyprexa Zydis as noted below scheduled twice daily and monitor to taper down gradually.   Change Zyprexa Zydis from 5 mg twice daily to 5 mg at bedtime to address  confusion and agitation, taper down gradually in the next few days.  Continue Zyprexa 5 mg every 8 hours IM as needed for breakthrough psychotic agitation  Precedex was completely discontinued at 2 AM on 2/4, will follow  ## Medical Decision Making Capacity:  Given on and off episodes of confusion, in my opinion patient does not have capacity to make medical decisions at this time.  Please note patient's capacity to make decisions is expected to improve as delirium and confusion improves.  We will continue to assess during follow-up.  ## Further Work-up:  --None at this time   -- most recent EKG on 1/24 had QtC of 434 -- Pertinent labwork reviewed.  ## Disposition:  --Per primary team  ## Behavioral / Environmental:  -- At this time, the patient's presentation is most consistent with delirium, most likely due to multiple etiologies including but not limited to infection, medications, pain, altered sleep/wake cycle, and limited mobility. During  this time period, minimization of delirogenic insults will be of utmost importance; this includes promoting the normal circadian cycle, minimizing lines/tubes, avoiding deliriogenic medications such as benzodiazepines and anticholinergic medications, and frequently reorienting the patient.  Symptomatic treatment for agitation can be provided by antipsychotic medications, though it is important to remember that these do not treat the underlying etiology of delirium. Notably, there can be a time lag effect between treatment of a medical problem and resolution of delirium. This time lag effect may be of longer duration in the elderly, and those with underlying cognitive impairment or brain injury.    Thank you for this consult request. Recommendations have been communicated to the primary team.  We will follow-up at this time.   Holland Nickson Winfred Leeds, MD   Follow-up  Relevant Aspects of Hospital Course:  Admitted on 02/07/2022 for severe abdominal pain.  On initial examination, patient presents alert and partially oriented, oriented to time except confusing the day of the week Friday instead of Saturday, he answers orientation question with some delay related to confusion.  He is able to provide history information with some limitation secondary to confusion, mother is at bedside assisting him recalling history information and agreeing that he is presently confused compared to his baseline.  Patient denies any history of diagnosed mental illness, denies any symptoms consistent with depression mania or hypomania, denies any passive or active SI intention or plan, denies HI or AVH.  Patient admits to feeling confused earlier today during the incident described by staff that he got agitated and threatening harm to self and others but he cannot recall any of these threats "I remember I was confused I do not remember I said the staff I apologize" mother also agrees that making this statement is not him at all.  Mother reports that last week after surgery he had 2 days of confusions on and off when he was talking occasionally out of his head but no agitation during these episodes.  Discussed with patient and his mother diagnosis of delirium with plan of care as noted below. Please see plan below for detailed  recommendations.    Collateral information:  Were obtained from patient's mother as noted above during initial eval  On 02/21/2022 patient was evaluated, wife and son in the room, chart review indicates Precedex was discontinued overnight with no worsening agitation and no as needed Zyprexa needed, received Zyprexa scheduled last night and about to receive it this morning when was evaluated.  Patient continues to present with some confusion but seems to be more fluent than yesterday unable to recall our conversation yesterday or that he saw this provider, he is alert and fully oriented to person place and time with some delay and occasional confusion noted, wife reports that this is unusual for him to have a delayed response or some confusion which is consistent with his delirium gradually improving.  Patient continues to deny feeling depressed or anxious, continues to deny passive or active SI intention or plan, denies HI or AVH.  Denies side effect to medications and does not appear sleepy or sedated.   Psychiatric History:  Information collected from patient and mother, patient does not have any history of diagnosed mental illness or psychiatric hospitalization or suicide attempts.  No history of psychotropic medication treatment recently or in the past.  Family psych history: None reported   Social History:  Married twice, current marriage for 3 years, no children Works as a area Physiological scientist at [employer] use: Denies  Alcohol use: Rarely, denies any history of blackouts or withdrawals Drug use: Denies any illicit drug use currently or in the past  Medical History: Past Medical History:  Diagnosis Date   Anxiety    Arthritis    CAD (coronary artery disease)    s/p quad CABG   Chronic osteomyelitis of left foot ([place]) 12/20/2018   Chronic osteomyelitis of right foot ([place]) 12/20/2018   Diabetes mellitus without complication ([place])    Diabetic foot infection ([place]) 12/20/2018    Diabetic neuropathy ([place]) 12/20/2018   History of kidney stones    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   Morbid obesity ([place]) 12/20/2018   MSSA (methicillin susceptible Staphylococcus aureus) infection 12/20/2018   PONV (postoperative nausea and vomiting)     Surgical History: Past Surgical History:  Procedure Laterality Date   AMPUTATION Right 05/10/2018   Procedure: PARTIAL 5TH RAY AMPUTATION RIGHT FOOT;  Surgeon: Caprice Beaver, DPM;  Location: [facility];  Service: Podiatry;  Laterality: Right;   AMPUTATION Left 10/26/2018   Procedure: PARTIAL 1ST RAY AMPUTATION FOOT;  Surgeon: Caprice Beaver, DPM;  Location: [facility];  Service: Podiatry;  Laterality: Left;   APPLICATION OF WOUND VAC Right 05/10/2018   Procedure: APPLICATION OF WOUND VAC RIGHT FOOT;  Surgeon: Caprice Beaver, DPM;  Location: [facility];  Service: Podiatry;  Laterality: Right;   APPLICATION OF WOUND VAC  01/28/2022   Procedure: APPLICATION OF WOUND VAC;  Surgeon: Erroll Luna, MD;  Location: [place];  Service: General;;   COLECTOMY WITH COLOSTOMY CREATION/HARTMANN PROCEDURE N/A 02/13/2022   ex-lap, resection of transverse colon with long Hartman's pouch, drainage of mesenteric abscess, resection of proximal jejunum with anastomosis - Dr. Brantley Stage   CORONARY ARTERY BYPASS GRAFT  04/01/2020   CABG x 5 (LIMA-LAD, RCA-PDA, VG-Diag, VG-OM, SVG-RPL) and LAA Clip at [facility] in [place] with Sande Brothers, MD   CORONARY ARTERY BYPASS GRAFT     x4   LAPAROTOMY N/A 02/08/2022   Procedure: EXPLORATORY LAPAROTOMY WITH BOWEL RESECTIOM;  Surgeon: Erroll Luna, MD;  Location: [place];  Service: General;  Laterality: N/A;   LAPAROTOMY N/A 02/20/2022   Procedure: EXPLORATORY LAPAROTOMY WITH ABDOMINAL IRRIGATION AND DEBRIDMENT;  Surgeon: Jovita Kussmaul, MD;  Location: [place];  Service: General;  Laterality: N/A;   VASECTOMY     WOUND DEBRIDEMENT Right 05/10/2018   Procedure: DEBRIDEMENT PUNCTURE WOUND;   Surgeon: Caprice Beaver, DPM;  Location: [facility];  Service: Podiatry;  Laterality: Right;   WOUND DEBRIDEMENT Left 05/10/2018   Procedure: DEBRIDEMENT ULCERATION LEFT FOOT;  Surgeon: Caprice Beaver, DPM;  Location: [facility];  Service: Podiatry;  Laterality: Left;   WOUND DEBRIDEMENT Left 10/26/2018   Procedure: DEBRIDEMENT ULCER LEFT FOOT;  Surgeon: Caprice Beaver, DPM;  Location: [facility];  Service: Podiatry;  Laterality: Left;    Medications:   Current Facility-Administered Medications:    0.9 %  sodium chloride infusion, , Intravenous, PRN, Saverio Danker, PA-C, Stopped at 03/05/2022 1214   acetaminophen (TYLENOL) suppository 650 mg, 650 mg, Rectal, Q8H PRN, Cecilie Lowers T, MD, 650 mg at 02/21/22 0227   alteplase (CATHFLO ACTIVASE) injection 2 mg, 2 mg, Intracatheter, Once, Saverio Danker, PA-C   Chlorhexidine Gluconate Cloth 2 % PADS 6 each, 6 each, Topical, Daily, Saverio Danker, PA-C, 6 each at 02/20/22 1753   enoxaparin (LOVENOX) injection 55 mg, 55 mg, Subcutaneous, Q24H, Saverio Danker, PA-C, 55 mg at 02/20/22 2119   HYDROmorphone (DILAUDID) injection 1-2 mg, 1-2 mg, Intravenous, Q2H PRN,  Saverio Danker, PA-C, 1 mg at 02/21/22 0934   insulin aspart (novoLOG) injection 0-20 Units, 0-20 Units, Subcutaneous, Q4H, Saverio Danker, PA-C, 4 Units at 02/21/22 0857   lactated ringers infusion, , Intravenous, Continuous, Dimple Nanas, RPH, Last Rate: 100 mL/hr at 02/21/22 1000, Infusion Verify at 02/21/22 1000   loperamide HCl (IMODIUM) 1 MG/7.5ML suspension 2 mg, 2 mg, Oral, TID, Ileana Roup, MD, 2 mg at 02/21/22 1059   micafungin (MYCAMINE) 150 mg in sodium chloride 0.9 % 100 mL IVPB, 150 mg, Intravenous, Q24H, Ileana Roup, MD, Last Rate: 107.5 mL/hr at 02/21/22 1137, 150 mg at 02/21/22 1137   norepinephrine (LEVOPHED) 16 mg in 216m (0.064 mg/mL) premix infusion, 0-40 mcg/min, Intravenous, Titrated, [name], Patrick D, NP, Last Rate: 9.38 mL/hr at 02/21/22 0932, 10  mcg/min at 02/21/22 0932   OLANZapine (ZYPREXA) injection 5 mg, 5 mg, Intramuscular, Q8H PRN, AWinfred Leeds Emmalene Kattner, MD   OLANZapine zydis (ZYPREXA) disintegrating tablet 5 mg, 5 mg, Oral, BID, WIleana Roup MD, 5 mg at 02/21/22 1058   ondansetron (ZOFRAN) injection 4 mg, 4 mg, Intravenous, Q6H PRN, OSaverio Danker PA-C, 4 mg at 02/17/22 0247   Oral care mouth rinse, 15 mL, Mouth Rinse, PRN, OSaverio Danker PA-C   Oral care mouth rinse, 15 mL, Mouth Rinse, PRN, TGeorganna Skeans MD   Oral care mouth rinse, 15 mL, Mouth Rinse, PRN, LDwana Melena MD   pantoprazole (PROTONIX) injection 40 mg, 40 mg, Intravenous, Q24H, White, CSharon Mt MD, 40 mg at 02/21/22 1058   piperacillin-tazobactam (ZOSYN) IVPB 3.375 g, 3.375 g, Intravenous, Q8H, OSaverio Danker PA-C, Stopped at 02/21/22 05366  prochlorperazine (COMPAZINE) injection 10 mg, 10 mg, Intravenous, Q4H PRN, OSaverio Danker PA-C, 10 mg at 01/29/2022 0539   simethicone (MYLICON) 40 MYQ/0.3KVsuspension 40 mg, 40 mg, Per Tube, QID PRN, OSaverio Danker PA-C   sodium chloride flush (NS) 0.9 % injection 10-40 mL, 10-40 mL, Intracatheter, Q12H, OSaverio Danker PA-C, 10 mL at 02/20/22 2121   sodium chloride flush (NS) 0.9 % injection 10-40 mL, 10-40 mL, Intracatheter, PRN, OSaverio Danker PA-C   sodium chloride flush (NS) 0.9 % injection 5 mL, 5 mL, Intracatheter, Q8H, OSaverio Danker PA-C, 5 mL at 02/21/22 0519   TPN ADULT (ION), , Intravenous, Continuous TPN, TDimple Nanas RPH, Last Rate: 80 mL/hr at 02/21/22 1000, Infusion Verify at 02/21/22 1000   TPN ADULT (ION), , Intravenous, Continuous TPN, TDimple Nanas RPH   [START ON 02/22/2022] vancomycin (VANCOREADY) IVPB 1250 mg/250 mL, 1,250 mg, Intravenous, Q24H, Rudisill, TJodean Lima RPH  Allergies: Allergies  Allergen Reactions   Morphine     hallucinations       Objective  Vital signs:  Temp:  [97.9 F (36.6 C)-102.3 F (39.1 C)] 100.7 F (38.2 C) (02/04 0800) Pulse Rate:  [75-140]  114 (02/04 1000) Resp:  [18-36] 26 (02/04 1000) BP: (81-144)/(38-95) 110/65 (02/04 1000) SpO2:  [77 %-97 %] 94 % (02/04 1000) Weight:  [116.6 kg] 116.6 kg (02/04 0447)  Psychiatric Specialty Exam:  Presentation  General Appearance: Casual  Eye Contact:Minimal  Speech:Normal Rate  Speech Volume:Decreased  Handedness:No data recorded  Mood and Affect  Mood:Euthymic  Affect:Congruent   Thought Process  Thought Processes:Coherent; Other (comment) (Occasionally appears confused)  Descriptions of Associations:Circumstantial  Orientation:Partial  Thought Content:Logical; Tangential  History of Schizophrenia/Schizoaffective disorder:No data recorded Duration of Psychotic Symptoms:No data recorded Hallucinations:Hallucinations: None  Ideas of Reference:None  Suicidal Thoughts:Suicidal Thoughts: No  Homicidal Thoughts:Homicidal Thoughts: No   Sensorium  Memory:Immediate Poor;  Remote Poor  Judgment:Poor  Insight:Poor   Executive Functions  Concentration:-- (Easily distracted)  Attention Span:Poor  Recall:-- (Limited)  Fund of Knowledge:-- (Limited)  Language:Fair   Psychomotor Activity  Psychomotor Activity:Psychomotor Activity: Decreased   Assets  Assets:No data recorded  Sleep  Sleep:Sleep: Fair    Physical Exam: Physical Exam Vitals and nursing note reviewed.    ROS Blood pressure 110/65, pulse (!) 114, temperature (!) 100.7 F (38.2 C), temperature source Oral, resp. rate (!) 26, height 6' (1.829 m), weight 116.6 kg, SpO2 94 %. Body mass index is 34.86 kg/m.

## 2022-02-21 NOTE — Progress Notes (Addendum)
Patient ID: Patrick [name], male   DOB: 07/08/1970, 52 y.o.   MRN: [id number] 2 Days Post-Op    Subjective: Ostomy with sweat since surgery, no flatus/effluent yet.  No nausea.   Objective: Vital signs in last 24 hours: Temp:  [97.9 F (36.6 C)-102.3 F (39.1 C)] 102.3 F (39.1 C) (02/04 0000) Pulse Rate:  [75-140] 126 (02/04 0700) Resp:  [15-36] 22 (02/04 0700) BP: (81-144)/(38-95) 91/57 (02/04 0700) SpO2:  [77 %-97 %] 93 % (02/04 0700) Arterial Line BP: (134-145)/(57-60) 145/57 (02/03 1000) Weight:  [116.6 kg] 116.6 kg (02/04 0447) Last BM Date :  (pta)  Intake/Output from previous day: 02/03 0701 - 02/04 0700 In: 3965.2 [I.V.:2343.7; IV Piggyback:1621.5] Out: 3315 [Urine:2265; Drains:1050] Intake/Output this shift: Total I/O In: -  Out: 200 [Drains:200]  Gen: NAD, actually looks a bit better today Heart: regular, in 90-100s Abd: soft, wound with increase in tan drainage still with base of the wound with fibrin, edges are clean. Ostomy pink with sweat in appliance.. 3 drains - 2 right sided with serosang; left sided bilious GU: foley in place with clear yellow urine today Ext: BLE edema Skin: some jaundice and diffuse anasarca Psych: A&Ox3  Lab Results: CBC  Recent Labs    02/20/22 0044 02/21/22 0247  WBC 21.4* 16.8*  HGB 9.9*  10.2* 9.2*  HCT 31.1*  32.1* 30.4*  PLT 614* 711*   BMET Recent Labs    02/20/22 0044 02/21/22 0247  NA 140 143  K 4.6 5.1  CL 92* 99  CO2 32 32  GLUCOSE 279* 180*  BUN 48* 51*  CREATININE 2.20* 2.14*  CALCIUM 8.0* 8.0*   PT/INR No results for input(s): "LABPROT", "INR" in the last 72 hours.  ABG Recent Labs    03/01/2022 2002  PHART 7.444  HCO3 35.6*    Studies/Results: DG Abd 1 View  Result Date: 03/04/2022 CLINICAL DATA:  NG tube EXAM: ABDOMEN - 1 VIEW COMPARISON:  Abdominal x-ray 02/06/2022 FINDINGS: Nasogastric tube tip is at the level of the gastric body. No dilated bowel loops are seen. Sternotomy wires are  present. There is left basilar atelectasis. IMPRESSION: Nasogastric tube tip is at the level of the gastric body. Electronically Signed   By: Ronney Asters M.D.   On: 02/18/2022 18:06    Anti-infectives: Anti-infectives (From admission, onward)    Start     Dose/Rate Route Frequency Ordered Stop   02/22/22 0800  vancomycin (VANCOREADY) IVPB 1250 mg/250 mL        1,250 mg 166.7 mL/hr over 90 Minutes Intravenous Every 24 hours 02/21/22 0233     02/21/22 0315  vancomycin (VANCOREADY) IVPB 2000 mg/400 mL        2,000 mg 200 mL/hr over 120 Minutes Intravenous  Once 02/21/22 0229 02/21/22 0502   02/17/22 1200  piperacillin-tazobactam (ZOSYN) IVPB 3.375 g        3.375 g 12.5 mL/hr over 240 Minutes Intravenous Every 8 hours 02/17/22 1103     02/15/22 1500  piperacillin-tazobactam (ZOSYN) IVPB 3.375 g        3.375 g 12.5 mL/hr over 240 Minutes Intravenous Every 8 hours 02/15/22 0836 02/17/22 0217   02/05/2022 1100  piperacillin-tazobactam (ZOSYN) IVPB 3.375 g        3.375 g 100 mL/hr over 30 Minutes Intravenous  Once 02/07/2022 1056 02/13/2022 1230   02/15/2022 0200  piperacillin-tazobactam (ZOSYN) IVPB 3.375 g  Status:  Discontinued        3.375 g 12.5 mL/hr over  240 Minutes Intravenous Every 8 hours 02/17/2022 2322 02/15/22 0836   01/26/2022 1900  piperacillin-tazobactam (ZOSYN) IVPB 3.375 g        3.375 g 100 mL/hr over 30 Minutes Intravenous  Once 01/27/2022 1857 02/03/2022 2012       Assessment/Plan: 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, proximal small bowel with ischemic necrotic proximal jejunum with perforation fecal contamination with abscess  POD 10, S/P Exploratory laparotomy with resection of transverse colon, drainage of mesenteric abscess, resection of proximal jejunum with small bowel anastomosis and 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  POD#2 s/p washout, additional drain placement, retention closure - near frozen abdomen - NPO due leak at small bowel  anastomosis likely; PPI, TID Imodium - LR bolus today 1L to replace loses; increased MIVF to 150/hr, cont TPN - Cont JP to bulb suction today - if switched to gravity bag, may increase the amount of leakage around the drain - consistent with small bowel source in color - TNA, concentrated - dressing changes to midline wound to help debride some slough and combat drainage, TID - WOC following - cont zosyn - surgical path with large B-cell lymphoma -culture from LUQ drain pending, Gram - rods -PT/OT evals -CBC and CMET daily  AKI - Cr stable at 2.14.  DC lasix gtt today to likely wean levo off.  Appreciate nephrology evaluation and assistance.  Continue to follow their recs  Echo reviewed and EF preserved but with some hypokinesis.   LLL PNA - pulm toilet. IS.  Noted on CT.  On zosyn already which should cover this for now.   CAD, s/p CABG - no chest pain, echo as above DM - SSI.  Sugars fairly well controlled Hyperbilirubinemia - unclear etiology but suspect related to bowel leak and reabsorption across peritoneum.  Gallstones noted on CT, but no ductal dilatation.  Other LFTs essentially normal.  Continue to monitor.  Hep panel negative. Improved s/p washout and addn'l drainage.  Chronic osteomyelitis of left foot - s/p amputation ABL anemia - 2 units prbc given 1/31, hgb stable.   monitor  FEN: NPO, TPN VTE: Lovenox ID: Zosyn 1/23>> ; adding mica empirically given how proximal this leak is in GI tract as well as him having ongoing vasopressor requirement  Dispo: ICU; on levophed, will see if addn'l volume helps come off this    CRITICAL CARE Performed by: Ileana Roup   Total critical care time: 40 minutes  Critical care time was exclusive of separately billable procedures and treating other patients.  Critical care was necessary to treat or prevent imminent or life-threatening deterioration.  Critical care was time spent personally by me on the following activities:  development of treatment plan with patient and/or surrogate as well as nursing, discussions with consultants, evaluation of patient's response to treatment, examination of patient, obtaining history from patient or surrogate, ordering and performing treatments and interventions, ordering and review of laboratory studies, ordering and review of radiographic studies, pulse oximetry and re-evaluation of patient's condition.    LOS: 12 days   Nadeen Landau, MD [facility], [facility]  02/21/2022

## 2022-02-21 NOTE — Progress Notes (Signed)
JP#3 draining so quickly when recharged, MD on call made aware

## 2022-02-21 NOTE — Progress Notes (Deleted)
Alma Friendly RN [facility]) came to reevaluated patient's wound vac. This RN and Alma Friendly confirmed that dressing was intact with an obstructed flow alarm. Some pressure was still applied, dressing clean and intact with no drainage. Agreed with plan to call MD if the dressing was no longer intact or began to leak.   Alma Friendly to follow-up early tomorrow morning.

## 2022-02-21 NOTE — Progress Notes (Signed)
NAME:  Patrick [name], MRN:  [id number], DOB:  [birth year], LOS: 12 ADMISSION DATE:  01/20/2022, CONSULTATION DATE:  02/01/2022 REFERRING MD:  Patrick [name], CHIEF COMPLAINT:  Post op Hypotension    History of Present Illness:  Patrick [name]. [name] is a 52 y.o. 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 Of note, he was seen by his PCP for severe abdominal pain x4 weeks that has increasingly progressed.   Patient was seen and evaluated by surgical team in which they believe on exam CT that he did not have free perforation/pneumoperitoneum/or peritoneal signs throughout the abdomen, however, did show an intra-abdominal mass measuring almost 15 cm at the level of the transverse colon splenic flexure.  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 IVF resuscitation and antibiotics started.   On 01/22/2022, patient was taken to the OR for Exploratory laparotomy with resection of transverse colon, drainage of mesenteric abscess, resection of proximal jejunum with small bowel anastomosis and 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. Post operatively patient developed hypotension requiring vasopressors.   PCCM consulted for post operative hypotension management.   Pertinent  Medical History   Past Medical History:  Diagnosis Date   Anxiety    Arthritis    CAD (coronary artery disease)    s/p quad CABG   Chronic osteomyelitis of left foot ([place]) 12/20/2018   Chronic osteomyelitis of right foot ([place]) 12/20/2018   Diabetes mellitus without complication ([place])    Diabetic foot infection ([place]) 12/20/2018   Diabetic neuropathy ([place]) 12/20/2018   History of kidney stones     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   Morbid obesity ([place]) 12/20/2018   MSSA (methicillin susceptible Staphylococcus aureus) infection 12/20/2018   PONV (postoperative nausea and vomiting)     Significant Hospital Events: Including procedures, antibiotic start and stop dates in addition to other pertinent events   01/20/2022 - Admitted, IVF resuscitation and antibiotics 01/25/2022 - Exploratory Lap with resection of transverse colon, drainage of mesenteric abscess, resection of proximal jejunum with SB anastamosis and diverting right colostomy and long Harmans pouch. 1/26 PCCM called back due to tachypnea and borderline BP's, deemed stable and [place] again signed off  2/2 taken back to surgery for exploratory lap and abdominal I&D with possible bowel resection however unable to complete resection abdominal washout completed.  Postoperatively patient returned back to ICU on ventilator, PCCM reengaged 2/3 Acute agitation with concern for threats to self, psych was consulted and felt it was acute delirium, medications changes ordered.  2/4 Intermittent confusion persist but improved compared to day prior  Interim History / Subjective:  Seen lying in bed with request of mouth care  Family at bedside and updated   Objective   Blood pressure (!) 91/57, pulse (!) 126, temperature (!) 102.3 F (39.1 C), temperature source Oral, resp. rate (!) 22, height 6' (1.829 m), weight 116.6 kg, SpO2 93 %.    Vent Mode: PSV;CPAP FiO2 (%):  [40 %] 40 % PEEP:  [5 cmH20] 5 cmH20 Pressure Support:  [5 cmH20] 5 cmH20   Intake/Output Summary (Last 24 hours) at 02/21/2022 0732 Last data filed at 02/21/2022 0700 Gross per 24 hour  Intake 3965.16  ml  Output 3315 ml  Net 650.16 ml    Filed Weights   03/10/2022 0919 02/20/22 0500 02/21/22 0447  Weight: 115.9 kg 115.2 kg 116.6 kg   Physical Examination: General: Acute on chronically ill appearing deconditioned elderly male lying in bed, in NAD HEENT:  [place]/AT, MM pink/moist, PERRL,  Neuro: Alert and oriented x2, slightly confused to situation  CV: s1s2 regular rate and rhythm, no murmur, rubs, or gallops,  PULM:  Clear to auscultation, no increased work of breathing, on 6L [place]  GI: soft, bowel sounds active in all 4 quadrants, non-tender, non-distended, tolerating TPN Extremities: warm/dry, generalized edema  Skin: no rashes or lesions  Resolved problems   Postoperative ventilator management -Extubated 2/3  Assessment & Plan:   Evolving sepsis in the setting of intra-abdominal infection in the setting of small bowel versus leak -General surgery able to complete abdominal washout 2/2 but due to adhesions, inflammation, and significant contamination they were unable to identify small bowel leak. -Wound culture with few E. Coli Chronic bilateral osteomyelitis to feet P: Remains on Zosyn per surgery  Drains per surgery  Off pressors  Return to OR for management of SB leak per surgery   Retroperitoneal mass -With invasion of the transverse colon with proximal small bowel and ischemic necrotic proximal jejunum with associated perforation and abdominal abscesses P: Local wound care  Pain control   Acute kidney injury - stable -Creatinine currently 2.10, GFR 36, renal function on admission P: Follow renal function  Monitor urine output Trend Bmet Avoid nephrotoxins Ensure adequate renal perfusion   Mild systolic congestive heart failure -Echocardiogram 1/29 revealed EF 50% with global hypokinesis and septal bounce suggestive of prior cardiac surgery and grade 2 diastolic dysfunction History of CAD, s/p CABG x5 P: Daily assessment for need of continued diuresing  Continuous telemetry  Strict intake and output  Ensure hemodynamic control   Hypoalbuminemia At risk malnutrition P: Continue TPN  Protein supplementation as able   Type 2 diabetes P: Continue SSI  CBG goal 140-180  Acute blood loss anemia P: Trend CBC   Hemoglobin goal > 7 Transfuse per protocol   High-grade B-cell lymphoma P Supportive care   Best Practice (right click and "Reselect all SmartList Selections" daily)   Diet/type: NPO DVT prophylaxis: LMWH GI prophylaxis: N/A Lines: N/A Foley:  Yes, and it is still needed Code Status:  full code Last date of multidisciplinary goals of care discussion: Per primary   Signature:   Jadie Allington D. Harris, NP-C [facility] Personal contact information can be found on Amion  If no contact or response made please call [phone] 02/21/2022, 7:32 AM

## 2022-02-21 NOTE — Progress Notes (Signed)
PHARMACY - TOTAL PARENTERAL NUTRITION CONSULT NOTE  Indication: Prolonged ileus  Patient Measurements: Height: 6' (182.9 cm) Weight: 116.6 kg (257 lb 0.9 oz) IBW/kg (Calculated) : 77.6 TPN AdjBW (KG): 86.6 Body mass index is 34.86 kg/m.  Assessment:  37 YOM presented with abdominal pain for 4 weeks, found to have retroperitoneal mass with necrotic jejunum, perforated fecal contamination and abscess. 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 Patient has been NPO for 4 days since admission. Patient/wife report he wasn't eating well for 4 weeks PTA and basically on a liquid diet. He endorses a ~10 lb weight loss during this time. Previously, he typically ate 2 meals a day (chicken, vegetables, beans and fruits) and 1 snack. Pharmacy consulted to dose TPN.  Glucose / Insulin: hx DM (A1C 6.6) on glipizide PTA.  Received 35 units SSI in past 24 hrs, Insulin increased to 55 units in TPN yesterday >> appear to be improving after insulin increase Decadron IV x1 given preop 2/2 Electrolytes: K: 4.6 >> 5.1 (decr'd in TPN 2/3), Mag: stable at 2.2 (goal >= 2), Phos: 5.1>> 2.5 (none in TPN), Na WNL but steadily increasing, CoCa stable 9.8 Renal: AKI - SCr improved slightly to 2.14 (BL 1), BUN up 51, s/p lasix gtt 2/1-2/2 AM, lasix x 1 on 2/2 PM  Hepatic: ALT WNL, AST sl increase to 47, tbili remains elev at 4.8, TG 277 (propofol postop 2/2>>2/3), albumin 1.7 S/p thiamine x 3 days (1/26>1/28) 2/4: Loperamide TID added per surgery to help with bowel leak Intake / Output; MIVF: UOP 0.16m/kg/hr, drain 10527m colostomy 23m57mnet +6L  2/4: LR bolus and infusion started at 100 mL/hr GI Imaging:  1/23 CT abd pelvis - Marked severity colitis with adjacent abscess  1/29 CT abd pelvis - suspicious for abscesses and PNA, small pleural effusion, anasarca GI Surgeries  / Procedures:  1/24 s/p ex lap with resection of transverse colon, drainage of mesenteric abscess, resection of proximal jejunum with SB anastomosis and diverting R colostomy and long Hartman's pouch 1/30 IR per drain placement 2/2 ex lap w/ I&D of abdominal abscess, 2 abdominal drains placed  Central access: PICC 02/12/22 TPN start date: 02/12/22  Nutritional Goals:  RD Estimated Needs Total Energy Estimated Needs: 2300-2500 Total Protein Estimated Needs: 120-135 grams Total Fluid Estimated Needs: >2 L/day   Un-concentrated TPN goal rate: 100 mL/hr to provide 132g AA and 2328 kCal/day  Current Nutrition:  TPN NPO  Plan:  Per discussion with surgery, will un-concentrate TPN (high drain output + fluid loss)  Continue TPN at new goal rate 100 ml/hr to provide 132g AA, 360g CHO and 2328 kCal, meeting 100% of needs Electrolytes in TPN: reduce Na to 523m38m , remove K (pending trend 2/5 may need to add small amount back), Ca 2mEq723m Mg 4mEq/31madd back Phos 123mmol23mCl:Ac 1:1.  Add standard MVI to TPN T-bili improved < 6. Jaundice improved but continue 1/2 trace elements until resolved, continue to hold chromium for now but consider adding back as renal function improves.  Continue insulin in TPN at 55 units - will not change for now as CBGs improved after new TPN hung last night Continue resistant SSI Q4H  Monitor TPN labs daily until stable, then standard on Mon/Thurs.  Monitor TG (ILE 25% of total kCal). Anticipate some degree of TG improvement with stopping propofol. Reduce MIVF to 100 mL/hr per  d/w surgery  Dimple Nanas, PharmD, BCPS 02/21/2022 7:13 AM

## 2022-02-22 DIAGNOSIS — R410 Disorientation, unspecified: Secondary | ICD-10-CM

## 2022-02-22 LAB — COMPREHENSIVE METABOLIC PANEL
ALT: 24 U/L (ref 0–44)
AST: 36 U/L (ref 15–41)
Albumin: <1.5 g/dL — ABNORMAL LOW (ref 3.5–5.0)
Alkaline Phosphatase: 139 U/L — ABNORMAL HIGH (ref 38–126)
Anion gap: 8 (ref 5–15)
BUN: 52 mg/dL — ABNORMAL HIGH (ref 6–20)
CO2: 27 mmol/L (ref 22–32)
Calcium: 7.5 mg/dL — ABNORMAL LOW (ref 8.9–10.3)
Chloride: 104 mmol/L (ref 98–111)
Creatinine, Ser: 2.07 mg/dL — ABNORMAL HIGH (ref 0.61–1.24)
GFR, Estimated: 38 mL/min — ABNORMAL LOW (ref >60)
Glucose, Bld: 120 mg/dL — ABNORMAL HIGH (ref 70–99)
Potassium: 4.3 mmol/L (ref 3.5–5.1)
Sodium: 139 mmol/L (ref 135–145)
Total Bilirubin: 4 mg/dL — ABNORMAL HIGH (ref 0.3–1.2)
Total Protein: 5.7 g/dL — ABNORMAL LOW (ref 6.5–8.1)

## 2022-02-22 LAB — GLUCOSE, CAPILLARY
Glucose-Capillary: 127 mg/dL — ABNORMAL HIGH (ref 70–99)
Glucose-Capillary: 130 mg/dL — ABNORMAL HIGH (ref 70–99)
Glucose-Capillary: 140 mg/dL — ABNORMAL HIGH (ref 70–99)
Glucose-Capillary: 141 mg/dL — ABNORMAL HIGH (ref 70–99)
Glucose-Capillary: 146 mg/dL — ABNORMAL HIGH (ref 70–99)
Glucose-Capillary: 149 mg/dL — ABNORMAL HIGH (ref 70–99)
Glucose-Capillary: 155 mg/dL — ABNORMAL HIGH (ref 70–99)

## 2022-02-22 LAB — CBC
HCT: 24.9 % — ABNORMAL LOW (ref 39.0–52.0)
Hemoglobin: 7.7 g/dL — ABNORMAL LOW (ref 13.0–17.0)
MCH: 24.9 pg — ABNORMAL LOW (ref 26.0–34.0)
MCHC: 30.9 g/dL (ref 30.0–36.0)
MCV: 80.6 fL (ref 80.0–100.0)
Platelets: 661 10*3/uL — ABNORMAL HIGH (ref 150–400)
RBC: 3.09 MIL/uL — ABNORMAL LOW (ref 4.22–5.81)
RDW: 19.5 % — ABNORMAL HIGH (ref 11.5–15.5)
WBC: 14.8 10*3/uL — ABNORMAL HIGH (ref 4.0–10.5)
nRBC: 0 % (ref 0.0–0.2)

## 2022-02-22 LAB — MAGNESIUM: Magnesium: 2 mg/dL (ref 1.7–2.4)

## 2022-02-22 LAB — PHOSPHORUS: Phosphorus: 3.8 mg/dL (ref 2.5–4.6)

## 2022-02-22 LAB — TRIGLYCERIDES: Triglycerides: 164 mg/dL — ABNORMAL HIGH (ref <150)

## 2022-02-22 MED ORDER — OLANZAPINE 10 MG IM SOLR
2.5000 mg | Freq: Once | INTRAMUSCULAR | Status: AC | PRN
  Administered 2022-02-27: 2.5 mg via INTRAMUSCULAR
  Filled 2022-02-22: qty 10

## 2022-02-22 MED ORDER — OLANZAPINE 5 MG PO TBDP
2.5000 mg | ORAL_TABLET | Freq: Every day | ORAL | Status: AC
  Administered 2022-02-22 – 2022-02-23 (×2): 2.5 mg via ORAL
  Filled 2022-02-22 (×2): qty 0.5

## 2022-02-22 MED ORDER — MAGIC MOUTHWASH W/LIDOCAINE
15.0000 mL | Freq: Three times a day (TID) | ORAL | Status: DC
  Administered 2022-02-22: 15 mL via ORAL
  Administered 2022-02-24: 5 mL via ORAL
  Administered 2022-02-25 – 2022-03-02 (×15): 15 mL via ORAL
  Filled 2022-02-22 (×25): qty 15

## 2022-02-22 MED ORDER — TRAVASOL 10 % IV SOLN
INTRAVENOUS | Status: AC
  Filled 2022-02-22: qty 1320

## 2022-02-22 NOTE — Progress Notes (Signed)
PHARMACY - TOTAL PARENTERAL NUTRITION CONSULT NOTE  Indication: Prolonged ileus  Patient Measurements: Height: 6' (182.9 cm) Weight: 117 kg (257 lb 15 oz) IBW/kg (Calculated) : 77.6 TPN AdjBW (KG): 86.6 Body mass index is 34.98 kg/m.  Assessment:  80 YOM presented with abdominal pain for 4 weeks, found to have retroperitoneal mass with necrotic jejunum, perforated fecal contamination and abscess. 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 Patient has been NPO for 4 days since admission. Patient/wife report he wasn't eating well for 4 weeks PTA and basically on a liquid diet. He endorses a ~10 lb weight loss during this time. Previously, he typically ate 2 meals a day (chicken, vegetables, beans and fruits) and 1 snack. Pharmacy consulted to dose TPN.  Glucose / Insulin: hx DM (A1C 6.6) on glipizide PTA.  Received 18 units SSI in past 24 hrs, Insulin increased to 55 units in TPN yesterday >> appear to be improving after insulin increase Decadron IV x1 given preop 2/2 Electrolytes: K: 4.3, Mag: 2 (goal >= 2), Phos: 3.8 (none in TPN), Na WNL, CoCa stable 9.8 Renal: AKI - SCr 2.14>2.07 (BL 1), BUN 52 Hepatic: Tbili remains elev at 4 (propofol postop 2/2>>2/3), albumin <1.5 S/p thiamine x 3 days (1/26>1/28) 2/4: Loperamide TID added per surgery to help with bowel leak Intake / Output; MIVF: UOP 0.67m/kg/hr, drain 10517m colostomy 76m62mnet +6L  2/4: LR bolus and infusion started at 100 mL/hr GI Imaging:  1/23 CT abd pelvis - Marked severity colitis with adjacent abscess  1/29 CT abd pelvis - suspicious for abscesses and PNA, small pleural effusion, anasarca GI Surgeries / Procedures:  1/24 s/p ex lap with resection of transverse colon, drainage of mesenteric abscess, resection of proximal jejunum with SB anastomosis and diverting R colostomy and  long Hartman's pouch 1/30 IR per drain placement 2/2 ex lap w/ I&D of abdominal abscess, 2 abdominal drains placed  Central access: PICC 02/12/22 TPN start date: 02/12/22  Nutritional Goals:  RD Estimated Needs Total Energy Estimated Needs: 2300-2500 Total Protein Estimated Needs: 120-135 grams Total Fluid Estimated Needs: >2 L/day   Un-concentrated TPN goal rate: 100 mL/hr to provide 132g AA and 2328 kCal/day  Current Nutrition:  TPN NPO  Plan:  Per discussion with surgery, will un-concentrate TPN (high drain output + fluid loss)  Continue TPN at new goal rate 100 ml/hr to provide 132g AA, 360g CHO and 2328 kCal, meeting 100% of needs Electrolytes in TPN: incr Na to 676m97m, remove K, Ca 2mEq46m Mg 4mEq/74mdecr Phos 7mmol/73mCl:Ac 1:1.  Add standard MVI to TPN T-bili improved < 6. Jaundice improved but continue 1/2 trace elements until resolved, continue to hold chromium for now but consider adding back as renal function improves.  Continue insulin in TPN at 55 units - will not change for now as CBGs improved after new TPN hung last night Continue resistant SSI Q4H  Monitor TPN labs daily until stable, then standard on Mon/Thurs.  Monitor TG (ILE 25% of total kCal). Anticipate some degree of TG improvement with stopping propofol. Reduce MIVF to 100 mL/hr per d/w surgery   Cathy PAlanda SlimD, FCCM [facility]Cal Pharmacist Please see AMION for all Pharmacists' Contact Phone Numbers 02/22/2022, 7:23 AM

## 2022-02-22 NOTE — Consult Note (Signed)
Patrick [name]   Service Date: February 22, 2022 LOS:  LOS: 13 days    Assessment  Patrick [name] is a 52 y.o. male admitted medically for 02/02/2022  4:49 PM for severe abdominal pain. 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   Consult was initiated after patient got agitated with the staff trying to place a catheter, during his agitation incident patient was noted to be threatening harm to staff and to himself, also was noted to be confused, staff reported this is the first time he got agitated threatening to harm to self or others since he has been in the hospital.  He was a started on Precedex currently at 0.6 mcg on 2/3 to address agitation.  Patient is no longer receiving sedation and or chemical restraints to reduce agitation or aggression towards self or staff.  His overall clinical presentation, has improved.  Patient no longer presenting with neurological focal deficits or delirium.  His current presentation is most consistent with delirium secondary to medical problems and recent surgeries.   Diagnoses:  Active Hospital problems: Principal Problem:   Abdominal mass Active Problems:   Protein-calorie malnutrition, severe   Ischemic bowel disease (HCC)   Pressure injury of skin   Delirium due to another medical condition     Plan  ## Safety and Observation Level:  - Based on my clinical [name], I estimate the patient to be at minimal risk of self harm in the current setting - At this time, we do not recommend any changes in level of observation. This decision is based on my review of the  chart including patient's history and current presentation, interview of the patient, mental status examination, and consideration of suicide risk including evaluating suicidal ideation, plan, intent, suicidal or self-harm behaviors, risk factors, and protective factors. This judgment is based on our ability to directly address suicide risk, implement suicide prevention strategies and develop a safety plan while the patient is in the clinical setting. Please contact our team if there is a concern that risk level has changed.   ## Medications:  Patient remains n.p.o., olanzapine Zydis remains best option at this time. Due to current clinical presentation, and hepatotoxicity side effects from olanzapine; will decrease dose from 5 mg at bedtime to 2.5 mg p.o. nightly to further target confusion and agitation.  Goal will continue to taper down. Will place order to dc olanzapine after 48 hours.   Continue Zyprexa 5 mg every 8 hours IM as needed for breakthrough psychotic agitation    ## Medical Decision Making Capacity:  Not formally assessed at this time.  ## Further Work-up:  --None at this time -- Patient with jaundice, with unknown etiology.  Consider consult and GI for further workup. --Labs reviewed, albumin less than 1.5, elevated creatinine 2.07, hemoglobin 7.7< 11.2 on 02/26/2022.  -- most recent EKG on 1/24 had QtC of 434   ## Disposition:  --Per primary team  ## Behavioral / Environmental:  -Continue delirium precautions. -- At this time, the patient's presentation is most consistent with delirium, most likely due to multiple etiologies including but not limited to infection, medications, pain, altered sleep/wake cycle, and limited mobility. During this time period, minimization  of delirogenic insults will be of utmost importance; this includes promoting the normal circadian cycle, minimizing lines/tubes, avoiding deliriogenic medications such as benzodiazepines and anticholinergic  medications, and frequently reorienting the patient. Symptomatic treatment for agitation can be provided by antipsychotic medications, though it is important to remember that these do not treat the underlying etiology of delirium. Notably, there can be a time lag effect between treatment of a medical problem and resolution of delirium. This time lag effect may be of longer duration in the elderly, and those with underlying cognitive impairment or brain injury.    Thank you for this consult request. Recommendations have been communicated to the primary team.  At this time current clinical presentation in which consult was initiated, had resolved.  We will sign off at this time.   Suella Broad, FNP   Follow-up  Relevant Aspects of Hospital Course:  Admitted on 02/13/2022 for severe abdominal pain. On initial [name], patient presents with some lethargy, difficult to keep awake.  However once awake he is alert and oriented x 4, able to compute simple calculations.  He also was able to determine today's date, to include identifying as a leap year.  He is able to answer all questions appropriately, however his speech is slurred at times.  Unclear if this is due to mouth being dry, as he was observed to be mouth breathing or dysarthria.  Overall patient's confusion and when she initially presented with versus delirium, appears to have been resolved and improving.  Patient denies any acute psychiatric concerns at this time.  He denies any agitation or aggression at this time towards staff or self.  He denies any suicidal ideation, homicidal ideation, and or auditory or visual hallucinations.  He further denies any complication related to his medication, however as noted difficult to remain awake.  Patient has not received as needed olanzapine in over 48 hours.  Collateral information:  Were obtained from patient's mother as noted above during initial eval   Psychiatric History:  Information  collected from patient and mother, patient does not have any history of diagnosed mental illness or psychiatric hospitalization or suicide attempts.  No history of psychotropic medication treatment recently or in the past.  Family psych history: None reported   Social History:  Married twice, current marriage for 3 years, no children Works as a area Physiological scientist at [employer] use: Denies Alcohol use: Rarely, denies any history of blackouts or withdrawals Drug use: Denies any illicit drug use currently or in the past  Medical History: Past Medical History:  Diagnosis Date   Anxiety    Arthritis    CAD (coronary artery disease)    s/p quad CABG   Chronic osteomyelitis of left foot ([place]) 12/20/2018   Chronic osteomyelitis of right foot ([place]) 12/20/2018   Diabetes mellitus without complication ([place])    Diabetic foot infection ([place]) 12/20/2018   Diabetic neuropathy ([place]) 12/20/2018   History of kidney stones    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   Morbid obesity ([place]) 12/20/2018   MSSA (methicillin susceptible Staphylococcus aureus) infection 12/20/2018   PONV (postoperative nausea and vomiting)     Surgical History: Past Surgical History:  Procedure Laterality Date   AMPUTATION Right 05/10/2018   Procedure: PARTIAL 5TH RAY AMPUTATION RIGHT FOOT;  Surgeon: Caprice Beaver, DPM;  Location: [facility];  Service: Podiatry;  Laterality: Right;   AMPUTATION Left 10/26/2018   Procedure: PARTIAL 1ST RAY AMPUTATION FOOT;  Surgeon: Caprice Beaver,  Marland Kitchen, DPM;  Location: [facility];  Service: Podiatry;  Laterality: Left;   APPLICATION OF WOUND VAC Right 05/10/2018   Procedure: APPLICATION OF WOUND VAC RIGHT FOOT;  Surgeon: Caprice Beaver, DPM;  Location: [facility];  Service: Podiatry;  Laterality: Right;   APPLICATION OF WOUND VAC  01/23/2022   Procedure: APPLICATION OF WOUND VAC;  Surgeon: Erroll Luna, MD;  Location: [facility];  Service: General;;   COLECTOMY  WITH COLOSTOMY CREATION/HARTMANN PROCEDURE N/A 02/05/2022   ex-lap, resection of transverse colon with long Hartman's pouch, drainage of mesenteric abscess, resection of proximal jejunum with anastomosis - Dr. Brantley Stage   CORONARY ARTERY BYPASS GRAFT  04/01/2020   CABG x 5 (LIMA-LAD, RCA-PDA, VG-Diag, VG-OM, SVG-RPL) and LAA Clip at [facility] in [place] with Sande Brothers, MD   CORONARY ARTERY BYPASS GRAFT     x4   LAPAROTOMY N/A 01/25/2022   Procedure: EXPLORATORY LAPAROTOMY WITH BOWEL RESECTIOM;  Surgeon: Erroll Luna, MD;  Location: [place];  Service: General;  Laterality: N/A;   LAPAROTOMY N/A 02/22/2022   Procedure: EXPLORATORY LAPAROTOMY WITH ABDOMINAL IRRIGATION AND DEBRIDMENT;  Surgeon: Jovita Kussmaul, MD;  Location: [place];  Service: General;  Laterality: N/A;   VASECTOMY     WOUND DEBRIDEMENT Right 05/10/2018   Procedure: DEBRIDEMENT PUNCTURE WOUND;  Surgeon: Caprice Beaver, DPM;  Location: [facility];  Service: Podiatry;  Laterality: Right;   WOUND DEBRIDEMENT Left 05/10/2018   Procedure: DEBRIDEMENT ULCERATION LEFT FOOT;  Surgeon: Caprice Beaver, DPM;  Location: [facility];  Service: Podiatry;  Laterality: Left;   WOUND DEBRIDEMENT Left 10/26/2018   Procedure: DEBRIDEMENT ULCER LEFT FOOT;  Surgeon: Caprice Beaver, DPM;  Location: [facility];  Service: Podiatry;  Laterality: Left;    Medications:   Current Facility-Administered Medications:    0.9 %  sodium chloride infusion, , Intravenous, PRN, Saverio Danker, PA-C, Stopped at 02/22/2022 1214   alteplase (CATHFLO ACTIVASE) injection 2 mg, 2 mg, Intracatheter, Once, Saverio Danker, PA-C   Chlorhexidine Gluconate Cloth 2 % PADS 6 each, 6 each, Topical, Daily, Saverio Danker, PA-C, 6 each at 02/21/22 1605   enoxaparin (LOVENOX) injection 55 mg, 55 mg, Subcutaneous, Q24H, Saverio Danker, PA-C, 55 mg at 02/21/22 2115   HYDROmorphone (DILAUDID) injection 1-2 mg, 1-2 mg, Intravenous, Q2H PRN, Saverio Danker, PA-C, 1 mg at 02/22/22 0936    insulin aspart (novoLOG) injection 0-20 Units, 0-20 Units, Subcutaneous, Q4H, Saverio Danker, PA-C, 3 Units at 02/22/22 1540   lactated ringers infusion, , Intravenous, Continuous, Dimple Nanas, RPH, Last Rate: 100 mL/hr at 02/22/22 1300, Infusion Verify at 02/22/22 1300   loperamide HCl (IMODIUM) 1 MG/7.5ML suspension 2 mg, 2 mg, Oral, TID, Ileana Roup, MD, 2 mg at 02/22/22 1540   magic mouthwash w/lidocaine, 15 mL, Oral, TID, Ileana Roup, MD, 15 mL at 02/22/22 1035   micafungin (MYCAMINE) 150 mg in sodium chloride 0.9 % 100 mL IVPB, 150 mg, Intravenous, Q24H, White, Sharon Mt, MD, Stopped at 02/22/22 1040   norepinephrine (LEVOPHED) 16 mg in 256m (0.064 mg/mL) premix infusion, 0-40 mcg/min, Intravenous, Titrated, Harris, Whitney D, NP, Last Rate: 1.88 mL/hr at 02/22/22 1034, 2 mcg/min at 02/22/22 1034   OLANZapine (ZYPREXA) injection 5 mg, 5 mg, Intramuscular, Q8H PRN, AWinfred Leeds Nadir, MD   OLANZapine zydis (ZYPREXA) disintegrating tablet 5 mg, 5 mg, Oral, QHS, Attiah, Nadir, MD, 5 mg at 02/21/22 2116   ondansetron (ZOFRAN) injection 4 mg, 4 mg, Intravenous, Q6H PRN, OSaverio Danker PA-C, 4 mg at 02/17/22 0247   Oral care  mouth rinse, 15 mL, Mouth Rinse, PRN, Saverio Danker, PA-C   Oral care mouth rinse, 15 mL, Mouth Rinse, PRN, Georganna Skeans, MD   Oral care mouth rinse, 15 mL, Mouth Rinse, PRN, Dwana Melena, MD   pantoprazole (PROTONIX) injection 40 mg, 40 mg, Intravenous, Q24H, White, Sharon Mt, MD, 40 mg at 02/22/22 1011   piperacillin-tazobactam (ZOSYN) IVPB 3.375 g, 3.375 g, Intravenous, Q8H, Saverio Danker, PA-C, Last Rate: 12.5 mL/hr at 02/22/22 1300, Infusion Verify at 02/22/22 1300   prochlorperazine (COMPAZINE) injection 10 mg, 10 mg, Intravenous, Q4H PRN, Saverio Danker, PA-C, 10 mg at 02/17/2022 0539   simethicone (MYLICON) 40 QX/4.5WT suspension 40 mg, 40 mg, Per Tube, QID PRN, Saverio Danker, PA-C   sodium chloride flush (NS) 0.9 % injection 10-40 mL,  10-40 mL, Intracatheter, Q12H, Saverio Danker, PA-C, 30 mL at 02/22/22 1011   sodium chloride flush (NS) 0.9 % injection 10-40 mL, 10-40 mL, Intracatheter, PRN, Saverio Danker, PA-C   sodium chloride flush (NS) 0.9 % injection 5 mL, 5 mL, Intracatheter, Q8H, Saverio Danker, PA-C, 5 mL at 02/22/22 0549   TPN ADULT (ION), , Intravenous, Continuous TPN, Dimple Nanas, RPH, Last Rate: 100 mL/hr at 02/22/22 1300, Infusion Verify at 02/22/22 1300   TPN ADULT (ION), , Intravenous, Continuous TPN, White, Sharon Mt, MD   vancomycin (VANCOREADY) IVPB 1250 mg/250 mL, 1,250 mg, Intravenous, Q24H, Dawayne Cirri, RPH, Stopped at 02/22/22 8882  Allergies: Allergies  Allergen Reactions   Morphine     hallucinations       Objective  Vital signs:  Temp:  [100 F (37.8 C)-101.1 F (38.4 C)] 100 F (37.8 C) (02/05 1200) Pulse Rate:  [112-137] 112 (02/05 1500) Resp:  [17-36] 26 (02/05 1500) BP: (80-137)/(54-87) 120/71 (02/05 1500) SpO2:  [85 %-98 %] 91 % (02/05 1500) Weight:  [117 kg] 117 kg (02/05 0500)  Psychiatric Specialty Exam:  Presentation  General Appearance: Appropriate for Environment; Casual  Eye Contact:Fair  Speech:-- (slurred at times)  Speech Volume:Decreased  Handedness:Right   Mood and Affect  Mood:Euthymic  Affect:Appropriate   Thought Process  Thought Processes:Coherent; Linear  Descriptions of Associations:Intact  Orientation:Full (Time, Place and Person)  Thought Content:Logical  History of Schizophrenia/Schizoaffective disorder:No data recorded Duration of Psychotic Symptoms:No data recorded Hallucinations:Hallucinations: None  Ideas of Reference:None  Suicidal Thoughts:Suicidal Thoughts: No  Homicidal Thoughts:Homicidal Thoughts: No   Sensorium  Memory:Immediate Fair; Recent Fair  Judgment:-- (improving)  Insight:Present   Executive Functions  Concentration:Other (comment) (drowsy, difficulty to keep awake)  Attention  Span:Poor  [place]   Psychomotor Activity  Psychomotor Activity:Psychomotor Activity: Decreased   Assets  Assets:Communication Skills; Desire for Improvement   Sleep  Sleep:Sleep: Fair    Physical Exam: Physical Exam Vitals and nursing note reviewed.  Constitutional:      Appearance: He is obese.     Interventions: Nasal cannula in place.  HENT:     Mouth/Throat:     Mouth: Mucous membranes are dry.  Eyes:     Comments: Yellowing of the sclera  Abdominal:     General: There is distension.  Skin:    Coloration: Skin is jaundiced.  Neurological:     General: No focal deficit present.     Mental Status: He is oriented to person, place, and time and easily aroused. Mental status is at baseline. He is lethargic.    ROS Blood pressure 120/71, pulse (!) 112, temperature 100 F (37.8 C), temperature source Oral, resp. rate Marland Kitchen)  26, height 6' (1.829 m), weight 117 kg, SpO2 91 %. Body mass index is 34.98 kg/m.

## 2022-02-22 NOTE — Progress Notes (Addendum)
Occupational Therapy Treatment Patient Details Name: Patrick [name] MRN: [id number] DOB: [birth year] Today's Date: 02/22/2022   History of present illness Pt is a 52 y/o male admitted 1/24 with progressive anorexia, weight loss and abdominal mass.  1/24 pt s/p exp. Lap. With resection of transverse colon, drainage of mesenteric abscess, resection of proximal jejunum with small bowel anastomosis and diverting right colostomy.   Re-exploration with washout and new drain placement by Dr. Marlou Starks 2/2.  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   OT comments  Pt currently max assist for transfers from supine to sit EOB and then min assist for sit to stand and pivot transfer to the bedside recliner.  HR at 121 in supine increasing up to 137 BPM with activity.  Oxygen sats at 94% on 3Ls nasal cannula with a decrease on room air in supine to 88%.  Pt placed back on 3Ls nasal cannula to increase.  BP also decreased at 98/61 in supine dropping to 84/54 sitting in the recliner.  No report of dizziness and nursing aware.  Feel he is progressing slowly and will continue to benefit from acute care OT at this time to help increased overall endurance and completion of selfcare tasks sit to stand.       Recommendations for follow up therapy are one component of a multi-disciplinary discharge planning process, led by the attending physician.  Recommendations may be updated based on patient status, additional functional criteria and insurance authorization.    Follow Up Recommendations  No OT follow up     Assistance Recommended at Discharge Frequent or constant Supervision/Assistance  Patient can return home with the following  Assist for transportation;Assistance with cooking/housework;A little help with bathing/dressing/bathroom;A little help with walking and/or transfers   Equipment Recommendations  BSC/3in1;Tub/shower bench        Precautions / Restrictions Precautions Precautions: Fall Precaution Comments: multiple drains, watch O2 and HR Restrictions Weight Bearing Restrictions: No       Mobility Bed Mobility Overal bed mobility: Needs Assistance Bed Mobility: Supine to Sit     Supine to sit: Max assist     General bed mobility comments: Assist for bringing trunk up to sitting and bringing LEs partially off of the EOB.    Transfers Overall transfer level: Needs assistance Equipment used: None Transfers: Sit to/from Stand, Bed to chair/wheelchair/BSC Sit to Stand: Min assist     Step pivot transfers: Min assist     General transfer comment: Min assist for sit to stand from the EOB.     Balance Overall balance assessment: Needs assistance Sitting-balance support: No upper extremity supported, Feet supported Sitting balance-Leahy Scale: Fair Sitting balance - Comments: Pt able to maintain static sitting EOB with supervision.   Standing balance support: No upper extremity supported Standing balance-Leahy Scale: Poor Standing balance comment: Needs UE support on therapist for balance and some therapist assist.                           ADL either performed or assessed with clinical judgement   ADL Overall ADL's : Needs assistance/impaired                     Lower Body Dressing: Total assistance;Bed level Lower Body Dressing Details (indicate cue type and reason): spouse assisted with gripper socks             Functional  mobility during ADLs: Minimal assistance (stand pivot transfer hand held assist) General ADL Comments: Pt currently max assist for supine to sit EOB, HR in the 121 range in sitting increasing up to 137 with transfer to the bedside recliner.  Oxygen at 94% on 3Ls nasal cannlua, decreasing to 88% on room air.      Cognition Arousal/Alertness: Awake/alert Behavior During Therapy: Flat affect Overall Cognitive Status: Within Functional Limits for  tasks assessed                                                     Pertinent Vitals/ Pain       Pain Assessment Pain Assessment: Faces Faces Pain Scale: Hurts a little bit Pain Location: abdomen Pain Descriptors / Indicators: Grimacing Pain Intervention(s): Monitored during session         Frequency  Min 2X/week        Progress Toward Goals  OT Goals(current goals can now be found in the care plan section)  Progress towards OT goals: Progressing toward goals  Acute Rehab OT Goals Patient Stated Goal: Pt did not state but agreeable to participation on OT tasks. OT Goal Formulation: With patient Time For Goal Achievement: 02/26/22 Potential to Achieve Goals: Good  Plan Frequency needs to be updated       AM-PAC OT "6 Clicks" Daily Activity     Outcome Measure   Help from another person eating meals?: None Help from another person taking care of personal grooming?: A Little Help from another person toileting, which includes using toliet, bedpan, or urinal?: A Little Help from another person bathing (including washing, rinsing, drying)?: A Lot Help from another person to put on and taking off regular upper body clothing?: A Little Help from another person to put on and taking off regular lower body clothing?: A Lot 6 Click Score: 17    End of Session Equipment Utilized During Treatment: Rolling walker (2 wheels)  OT Visit Diagnosis: Unsteadiness on feet (R26.81);Muscle weakness (generalized) (M62.81);Pain Pain - part of body:  (abdomen)   Activity Tolerance Patient limited by fatigue   Patient Left in chair;with call bell/phone within reach;with chair alarm set   Nurse Communication Mobility status        Time: 7121-9758 OT Time Calculation (min): 30 min  Charges: OT General Charges $OT Visit: 1 Visit OT Treatments $Self Care/Home Management : 23-37 mins  Roald Lukacs OTR/L 02/22/2022, 1:02 PM

## 2022-02-22 NOTE — Consult Note (Signed)
[place] Nurse ostomy follow up Stoma type/location: RUQ   Stomal assessment/size: 1 3/4" x 1 1/4" oval, skin level, mucocutaneous separation noted 12 to 2 o'clock, then 5-9 o'clock; separation at 5-9 o'clock most significant measuring 0.3 cms, filled with alginate  Peristomal assessment: circumferential erythema extending approximately 0.2 cms Treatment options for stomal/peristomal skin: filled largest area of mucocutaneous separation with alginate as above, crusted surrounding skin with ostomy powder and skin prep, utilized 2" barrier ring  Output 25 mls brown liquid  Ostomy pouching: 1pc 2 1/8" convex Kellie Simmering [phone]) Education provided: Patient is less alert at this visit than prior visits and does not participate in any education.  Wife present at today's pouch change.  Wife feels confident that she can change pouch independently and has done this at previous visits.  We did discuss treatment of the separation and crusting for the erythema.   Enrolled patient in [place] Start Discharge program: Yes  WOC will follow weekly for ostomy support and education.    Thank you,    Larnie Heart MSN, RN-BC, [employer]

## 2022-02-22 NOTE — Progress Notes (Signed)
PT Cancellation Note  Patient Details Name: RIAD WAGLEY MRN: [id number] DOB: [birth year]   Cancelled Treatment:    Reason Eval/Treat Not Completed: Patient declined, no reason specified.  Getting a dressing change this afternoon on arrival, just back in bed at "lunch" time.   Declined doing anything after dressing change. 02/22/2022  Ginger Carne., PT Acute Rehabilitation Services [phone]  (office)   [name] Heidie Krall 02/22/2022, 5:46 PM

## 2022-02-22 NOTE — Progress Notes (Signed)
Titrations for levophed not transferring from pump to [facility]. Reassociated medication, but information still not transferring over. Discussed with provider titration of levo in response to low BP's. Will continue to titrate as appropriate.

## 2022-02-22 NOTE — Progress Notes (Addendum)
Patient ID: Patrick [name], male   DOB: 03-02-1970, 52 y.o.   MRN: [id number] 3 Days Post-Op    Subjective: Ostomy with small amount of effluent.  No nausea with NGT out.  Mental status much better.  Down to 3L [place].  Levo weaned to 49mg this morning. Temp 100.9  Objective: Vital signs in last 24 hours: Temp:  [99.5 F (37.5 C)-100.9 F (38.3 C)] 100.9 F (38.3 C) (02/05 0800) Pulse Rate:  [111-137] 117 (02/05 0800) Resp:  [19-36] 25 (02/05 0800) BP: (87-138)/(54-87) 131/71 (02/05 0800) SpO2:  [85 %-98 %] 94 % (02/05 0800) Weight:  [[416kg] 117 kg (02/05 0500) Last BM Date :  (pta)  Intake/Output from previous day: 02/04 0701 - 02/05 0700 In: 5435.2 [I.V.:4588.1; IV Piggyback:847.1] Out: 46063[Urine:3850; Drains:830; Stool:10] Intake/Output this shift: Total I/O In: -  Out: 75 [Drains:75]  Gen: NAD, fatigued Heart: regular, but tachy Abd: soft, wound clean with retention sutures in place.  Dressing just changed.  LUQ drain with copious bilious output, drained 75cc while I was present, 500cc 24/hrs.  JP 3 with a small amount of bilious drainage.  JP 2 with scant bloody drainage.  One drain 300cc and on 30cc, unclear which is which in I/Os though.  Colostomy with scant effluent present GU: foley in place with clear yellow urine today Ext: BLE edema Skin: some jaundice and diffuse anasarca Psych: A&Ox3  Lab Results: CBC  Recent Labs    02/21/22 0247 02/22/22 0500  WBC 16.8* 14.8*  HGB 9.2* 7.7*  HCT 30.4* 24.9*  PLT 711* 661*   BMET Recent Labs    02/21/22 0247 02/22/22 0500  NA 143 139  K 5.1 4.3  CL 99 104  CO2 32 27  GLUCOSE 180* 120*  BUN 51* 52*  CREATININE 2.14* 2.07*  CALCIUM 8.0* 7.5*   PT/INR No results for input(s): "LABPROT", "INR" in the last 72 hours.  ABG Recent Labs    03/05/2022 2002  PHART 7.444  HCO3 35.6*    Studies/Results: No results found.  Anti-infectives: Anti-infectives (From admission, onward)    Start     Dose/Rate Route  Frequency Ordered Stop   02/22/22 0800  vancomycin (VANCOREADY) IVPB 1250 mg/250 mL        1,250 mg 166.7 mL/hr over 90 Minutes Intravenous Every 24 hours 02/21/22 0233     02/21/22 1030  micafungin (MYCAMINE) 150 mg in sodium chloride 0.9 % 100 mL IVPB        150 mg 107.5 mL/hr over 1 Hours Intravenous Every 24 hours 02/21/22 0934     02/21/22 0315  vancomycin (VANCOREADY) IVPB 2000 mg/400 mL        2,000 mg 200 mL/hr over 120 Minutes Intravenous  Once 02/21/22 0229 02/21/22 0502   02/17/22 1200  piperacillin-tazobactam (ZOSYN) IVPB 3.375 g        3.375 g 12.5 mL/hr over 240 Minutes Intravenous Every 8 hours 02/17/22 1103     02/15/22 1500  piperacillin-tazobactam (ZOSYN) IVPB 3.375 g        3.375 g 12.5 mL/hr over 240 Minutes Intravenous Every 8 hours 02/15/22 0836 02/17/22 0217   02/17/2022 1100  piperacillin-tazobactam (ZOSYN) IVPB 3.375 g        3.375 g 100 mL/hr over 30 Minutes Intravenous  Once 02/14/2022 1056 02/15/2022 1230   02/06/2022 0200  piperacillin-tazobactam (ZOSYN) IVPB 3.375 g  Status:  Discontinued        3.375 g 12.5 mL/hr over 240 Minutes Intravenous  Every 8 hours 01/22/2022 2322 02/15/22 0836   02/04/2022 1900  piperacillin-tazobactam (ZOSYN) IVPB 3.375 g        3.375 g 100 mL/hr over 30 Minutes Intravenous  Once 01/19/2022 1857 02/03/2022 2012       Assessment/Plan: 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, proximal small bowel with ischemic necrotic proximal jejunum with perforation fecal contamination with abscess  POD 12/3, S/P Exploratory laparotomy with resection of transverse colon, drainage of mesenteric abscess, resection of proximal jejunum with small bowel anastomosis and 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 by Dr. Brantley Stage.  Re-exploration with washout and new drain placement by Dr. Marlou Starks 2/2 - NPO due leak at small bowel anastomosis . May have a few ice chips - cont NPO/TNA and treat this like a controlled fistula - 3 JP drains  present.  LUQ at SB leak site.  Other 2 in places where abscesses were unroofed in OR -WBC 14K, T max 100.9 - TNA, concentrated - dressing changes to midline wound.  Retentions in place. - WOC following for new ostomy - cont zosyn, Vanc added 2/4 - imodium in place to help slow leak output - surgical path with large B-cell lymphoma -culture from LUQ drain pending, FEW ESCHERICHIA COLI  FEW ENTEROCOCCUS FAECALIS  FEW STREPTOCOCCUS ANGINOSIS  - resistant to amp, bactrim, cipro -PT/OT evals -CBC and CMET daily   AKI - down to 2.07.   Fluid overload/hypoalbuminemia - making great improvements.  Monitor closely. Cont full rate TNA, but concentrate this.  Echo reviewed and EF preserved but with some hypokinesis.   LLL PNA - pulm toilet. IS.  Noted on CT.  On zosyn already which should cover this for now.  but Vanc added 2/4 as well CAD, s/p CABG - no chest pain, echo as above DM - SSI.  Sugars fairly well controlled Hyperbilirubinemia - unclear etiology but suspect related to bowel leak and reabsorption across peritoneum.  Gallstones noted on CT, but no ductal dilatation.  Other LFTs essentially normal.  Continue to monitor.  Hep panel negative. Improved s/p washout and addn'l drainage. Chronic osteomyelitis of left foot - s/p amputation ABL anemia- 2 units prbc given 1/31, hgb stable.   Monitor 7.7 this am, slowly down trending again  FEN: NPO, TPN VTE: Lovenox ID: Zosyn 1/23>>  Vanc 2/4 -->  Dispo: ICU   Henreitta Cea 8:26 AM 02/22/2022  Please refer to amion for on call pager number between 7-4:15pm

## 2022-02-23 ENCOUNTER — Inpatient Hospital Stay (HOSPITAL_COMMUNITY): Payer: BC Managed Care – PPO

## 2022-02-23 DIAGNOSIS — F05 Delirium due to known physiological condition: Secondary | ICD-10-CM

## 2022-02-23 LAB — PREPARE RBC (CROSSMATCH)

## 2022-02-23 LAB — URINALYSIS, ROUTINE W REFLEX MICROSCOPIC
Bilirubin Urine: NEGATIVE
Glucose, UA: NEGATIVE mg/dL
Ketones, ur: NEGATIVE mg/dL
Leukocytes,Ua: NEGATIVE
Nitrite: NEGATIVE
Protein, ur: 30 mg/dL — AB
Specific Gravity, Urine: 1.018 (ref 1.005–1.030)
pH: 6 (ref 5.0–8.0)

## 2022-02-23 LAB — COMPREHENSIVE METABOLIC PANEL
ALT: 23 U/L (ref 0–44)
AST: 36 U/L (ref 15–41)
Albumin: <1.5 g/dL — ABNORMAL LOW (ref 3.5–5.0)
Alkaline Phosphatase: 156 U/L — ABNORMAL HIGH (ref 38–126)
Anion gap: 9 (ref 5–15)
BUN: 47 mg/dL — ABNORMAL HIGH (ref 6–20)
CO2: 24 mmol/L (ref 22–32)
Calcium: 7.6 mg/dL — ABNORMAL LOW (ref 8.9–10.3)
Chloride: 109 mmol/L (ref 98–111)
Creatinine, Ser: 1.86 mg/dL — ABNORMAL HIGH (ref 0.61–1.24)
GFR, Estimated: 44 mL/min — ABNORMAL LOW (ref >60)
Glucose, Bld: 140 mg/dL — ABNORMAL HIGH (ref 70–99)
Potassium: 3.7 mmol/L (ref 3.5–5.1)
Sodium: 142 mmol/L (ref 135–145)
Total Bilirubin: 5.3 mg/dL — ABNORMAL HIGH (ref 0.3–1.2)
Total Protein: 6.4 g/dL — ABNORMAL LOW (ref 6.5–8.1)

## 2022-02-23 LAB — CBC
HCT: 23.3 % — ABNORMAL LOW (ref 39.0–52.0)
Hemoglobin: 7.1 g/dL — ABNORMAL LOW (ref 13.0–17.0)
MCH: 24.8 pg — ABNORMAL LOW (ref 26.0–34.0)
MCHC: 30.5 g/dL (ref 30.0–36.0)
MCV: 81.5 fL (ref 80.0–100.0)
Platelets: 590 10*3/uL — ABNORMAL HIGH (ref 150–400)
RBC: 2.86 MIL/uL — ABNORMAL LOW (ref 4.22–5.81)
RDW: 19.4 % — ABNORMAL HIGH (ref 11.5–15.5)
WBC: 12.3 10*3/uL — ABNORMAL HIGH (ref 4.0–10.5)
nRBC: 0 % (ref 0.0–0.2)

## 2022-02-23 LAB — GLUCOSE, CAPILLARY
Glucose-Capillary: 127 mg/dL — ABNORMAL HIGH (ref 70–99)
Glucose-Capillary: 129 mg/dL — ABNORMAL HIGH (ref 70–99)
Glucose-Capillary: 134 mg/dL — ABNORMAL HIGH (ref 70–99)
Glucose-Capillary: 151 mg/dL — ABNORMAL HIGH (ref 70–99)
Glucose-Capillary: 156 mg/dL — ABNORMAL HIGH (ref 70–99)
Glucose-Capillary: 166 mg/dL — ABNORMAL HIGH (ref 70–99)

## 2022-02-23 LAB — HEMOGLOBIN AND HEMATOCRIT, BLOOD
HCT: 27.6 % — ABNORMAL LOW (ref 39.0–52.0)
Hemoglobin: 8 g/dL — ABNORMAL LOW (ref 13.0–17.0)

## 2022-02-23 MED ORDER — METOPROLOL TARTRATE 5 MG/5ML IV SOLN
5.0000 mg | Freq: Four times a day (QID) | INTRAVENOUS | Status: DC | PRN
  Administered 2022-02-23: 5 mg via INTRAVENOUS
  Filled 2022-02-23 (×2): qty 5

## 2022-02-23 MED ORDER — ACETAMINOPHEN 650 MG RE SUPP
650.0000 mg | Freq: Four times a day (QID) | RECTAL | Status: DC | PRN
  Administered 2022-02-23 – 2022-03-01 (×5): 650 mg via RECTAL
  Filled 2022-02-23 (×5): qty 1

## 2022-02-23 MED ORDER — SODIUM CHLORIDE 0.9% IV SOLUTION: Freq: Once | INTRAVENOUS | Status: DC

## 2022-02-23 MED ORDER — ACETAMINOPHEN 650 MG RE SUPP
650.0000 mg | Freq: Three times a day (TID) | RECTAL | Status: DC | PRN
  Administered 2022-02-23: 650 mg via RECTAL
  Filled 2022-02-23: qty 1

## 2022-02-23 MED ORDER — TRAVASOL 10 % IV SOLN
INTRAVENOUS | Status: AC
  Filled 2022-02-23: qty 1320

## 2022-02-23 NOTE — Progress Notes (Signed)
PHARMACY - TOTAL PARENTERAL NUTRITION CONSULT NOTE  Indication: Prolonged ileus  Patient Measurements: Height: 6' (182.9 cm) Weight: 117 kg (257 lb 15 oz) IBW/kg (Calculated) : 77.6 TPN AdjBW (KG): 86.6 Body mass index is 34.98 kg/m.  Assessment:  22 YOM presented with abdominal pain for 4 weeks, found to have retroperitoneal mass with necrotic jejunum, perforated fecal contamination and abscess. 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 Patient has been NPO for 4 days since admission. Patient/wife report he wasn't eating well for 4 weeks PTA and basically on a liquid diet. He endorses a ~10 lb weight loss during this time. Previously, he typically ate 2 meals a day (chicken, vegetables, beans and fruits) and 1 snack. Pharmacy consulted to dose TPN.  Glucose / Insulin: hx DM (A1C 6.6) on glipizide PTA. CBGs controlled <180 Received 18 units SSI in past 24 hrs + 55 units regular insulin in TPN bag Decadron IV x1 given preop 2/2 Electrolytes: K 3.7, others stable WNL Renal: AKI - SCr 1.86 (BL 1), BUN 47 Hepatic: LFTs WNL, Tbili remains elev at 4>5.3, TG down to 164 (propofol postop 2/2>>2/3), albumin <1.5 S/p thiamine x 3 days (1/26>1/28) Intake / Output; MIVF: UOP 0.88m/kg/hr, drain 945m colostomy 251mnet +8.8L this admit GI Imaging: 1/23 CT abd pelvis - Marked severity colitis with adjacent abscess  1/29 CT abd pelvis - suspicious for abscesses and PNA, small pleural effusion, anasarca GI Surgeries / Procedures: 1/24 s/p ex lap with resection of transverse colon, drainage of mesenteric abscess, resection of proximal jejunum with SB anastomosis and diverting R colostomy and long Hartman's pouch 1/30 IR per drain placement 2/2 ex lap w/ I&D of abdominal abscess, 2 abdominal drains placed  Central access: PICC 02/12/22 TPN start date:  02/12/22  Nutritional Goals:  RD Estimated Needs Total Energy Estimated Needs: 2300-2500 Total Protein Estimated Needs: 120-135 grams Total Fluid Estimated Needs: >2 L/day   TPN goal rate: 100 mL/hr to provide 132g AA and 2328 kCal/day  Current Nutrition:  TPN NPO  Plan:  Continue TPN at goal rate 100 ml/hr to provide 132g AA, 360g CHO and 2328 kCal, meeting 100% of needs Electrolytes in TPN: Na 61m48m, add back K 20 meq/L, Ca 2mEq65m Mg 4mEq/71mPhos 7mmol/30mCl:Ac 1:1.  Add standard MVI to TPN T-bili improved < 6. Jaundice improved but continue 1/2 trace elements until resolved, continue to hold chromium for now but consider adding back as renal function improves.  Continue insulin in TPN at 55 units Continue resistant SSI Q4H  Monitor TPN labs daily until stable, then standard Mon/Thurs Monitor TG (SMOF lipids 25% of total kCal). Anticipate some degree of TG improvement with stopping propofol   Cathy PAlanda SlimD, FCCM ClMay[facility] Digest[facility] LLCal Pharmacist Please see AMION for all Pharmacists' Contact Phone Numbers 02/23/2022, 9:21 AM

## 2022-02-23 NOTE — Progress Notes (Signed)
Physical Therapy Treatment Patient Details Name: Patrick [name] MRN: [id number] DOB: 10-Jun-1970 Today's Date: 02/23/2022   History of Present Illness Pt is a 52 y/o male admitted 1/24 with progressive anorexia, weight loss and abdominal mass.  1/24 pt s/p exp. Lap. With resection of transverse colon, drainage of mesenteric abscess, resection of proximal jejunum with small bowel anastomosis and diverting right colostomy.   Re-exploration with washout and new drain placement by Dr. Marlou Starks 2/2.  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    PT Comments    Patient progressing slowly towards PT goals. Session focused on gait training and transfers. Pt reports no pain today. Requires heavy Mod A to stand and Min guard assist and use of RW for short distance ambulation in room with chair follow.  Sp02 dropped to 84% on 10L/min 02 [place] and HR up to 130s bpm with activity. BP more stable sitting upright. Pt with 2/4 DOE with activity. Encouraged sitting up in chair for ~1 hour if tolerated. Reviewed bracing and importance of IS. Will continue to follow and progress as tolerated.   Recommendations for follow up therapy are one component of a multi-disciplinary discharge planning process, led by the attending physician.  Recommendations may be updated based on patient status, additional functional criteria and insurance authorization.  Follow Up Recommendations  Home health PT     Assistance Recommended at Discharge Intermittent Supervision/Assistance  Patient can return home with the following A little help with walking and/or transfers;A little help with bathing/dressing/bathroom;Assistance with cooking/housework;Assist for transportation;Help with stairs or ramp for entrance   Equipment Recommendations  Rolling walker (2 wheels)    Recommendations for Other Services       Precautions / Restrictions Precautions Precautions:  Fall Precaution Comments: multiple drains, watch O2 and HR Restrictions Weight Bearing Restrictions: No     Mobility  Bed Mobility Overal bed mobility: Needs Assistance Bed Mobility: Rolling, Sidelying to Sit Rolling: Min assist Sidelying to sit: Mod assist, HOB elevated       General bed mobility comments: Assist for bringing trunk up to sitting and bringing LEs partially off of the EOB. CUes for log roll technique and heavy use of rail.    Transfers Overall transfer level: Needs assistance Equipment used: Rolling walker (2 wheels) Transfers: Sit to/from Stand, Bed to chair/wheelchair/BSC Sit to Stand: Mod assist           General transfer comment: Heavy Mod A to power to standing with cues for hand placement/technique. Stood from [employer], transferred to chair post ambulation.    Ambulation/Gait Ambulation/Gait assistance: Min guard Gait Distance (Feet): 10 Feet Assistive device: Rolling walker (2 wheels) Gait Pattern/deviations: Step-through pattern, Decreased step length - right, Decreased step length - left, Decreased stride length Gait velocity: decreased Gait velocity interpretation: <1.31 ft/sec, indicative of household ambulator   General Gait Details: short slow steps with flexed posture. cues for proximity to the RW and posture, HR up to 130s bpm and SP02 dropped to 84% on 10L/min 02 [place].   Stairs             Wheelchair Mobility    Modified Rankin (Stroke Patients Only)       Balance Overall balance assessment: Needs assistance Sitting-balance support: Feet supported, Bilateral upper extremity supported Sitting balance-Leahy Scale: Fair Sitting balance - Comments: Pt able to maintain static sitting EOB with supervision holding onto RW for support   Standing balance support:  During functional activity, Bilateral upper extremity supported Standing balance-Leahy Scale: Poor Standing balance comment: Needs UE support on RW in standing                             Cognition Arousal/Alertness: Awake/alert Behavior During Therapy: Flat affect Overall Cognitive Status: Within Functional Limits for tasks assessed                                          Exercises      General Comments General comments (skin integrity, edema, etc.): Wife present during session. Sp02 dropped to 84% on 10L/min 02 [place] and HR up to 130s bpm with activity.      Pertinent Vitals/Pain Pain Assessment Pain Assessment: Faces Faces Pain Scale: No hurt    Home Living                          Prior Function            PT Goals (current goals can now be found in the care plan section) Progress towards PT goals: Progressing toward goals    Frequency    Min 3X/week      PT Plan Current plan remains appropriate    Co-evaluation              AM-PAC PT "6 Clicks" Mobility   Outcome Measure  Help needed turning from your back to your side while in a flat bed without using bedrails?: A Little Help needed moving from lying on your back to sitting on the side of a flat bed without using bedrails?: A Lot Help needed moving to and from a bed to a chair (including a wheelchair)?: A Lot Help needed standing up from a chair using your arms (e.g., wheelchair or bedside chair)?: A Lot Help needed to walk in hospital room?: Total Help needed climbing 3-5 steps with a railing? : A Lot 6 Click Score: 12    End of Session Equipment Utilized During Treatment: Gait belt;Oxygen Activity Tolerance: Patient tolerated treatment well Patient left: in chair;with call bell/phone within reach;with chair alarm set;with family/visitor present Nurse Communication: Mobility status PT Visit Diagnosis: Unsteadiness on feet (R26.81);Muscle weakness (generalized) (M62.81)     Time: 9379-0240 PT Time Calculation (min) (ACUTE ONLY): 23 min  Charges:  $Therapeutic Activity: 23-37 mins                     Marisa Severin, PT,  DPT Acute Rehabilitation Services Secure chat preferred Office [place] 02/23/2022, 8:57 AM

## 2022-02-23 NOTE — Progress Notes (Signed)
Palliative consult received.  Attempted to meet with patient/family unable due to family already left for the day. Meeting scheduled for 2/7 at [place].  No Charge.  Theodoro Grist, DNP, AGNP-C Palliative Medicine  Please call Palliative Medicine team phone with any questions [phone]. For individual providers please see AMION.

## 2022-02-23 NOTE — Progress Notes (Signed)
Patient ID: Patrick [name], male   DOB: 1970-07-18, 52 y.o.   MRN: [id number] 4 Days Post-Op    Subjective: Ostomy with small amount of effluent.  No nausea with NGT out.  Mental status much better.  Back up to 10L [place] and on 21mg of levo down from 10 yesterday, but having to go back up likely due to some hypotension.  Temp of 103 overnight and increased tachycardia.  HR better this am in 110s.  Denies any significant abdominal pain, SOB, or other complaints.  Actually looks a little better today than in days past.  Objective: Vital signs in last 24 hours: Temp:  [98.3 F (36.8 C)-103.1 F (39.5 C)] 99.6 F (37.6 C) (02/06 0400) Pulse Rate:  [102-137] 102 (02/06 0615) Resp:  [16-31] 21 (02/06 0615) BP: (72-136)/(50-81) 133/72 (02/06 0615) SpO2:  [89 %-100 %] 98 % (02/06 0615) Last BM Date :  (colostomy)  Intake/Output from previous day: 02/05 0701 - 02/06 0700 In: 5013.3 [I.V.:4545.5; IV Piggyback:467.7] Out: 3010 [Urine:2060; Drains:925; Stool:25] Intake/Output this shift: No intake/output data recorded.  Gen: NAD, fatigued Heart: regular, but tachy Lungs: decrease BS at bases, but otherwise clear.  On 10L [place] Abd: soft, wound clean with retention sutures in place.   LUQ drain with copious bilious output and not holding charge.  Changed to gravity bag set up with 200cc of immediate evacuation of enteric succus, 920cc/24 hrs  JP 3 with a small amount of bilious drainage, 5cc in 24 hrs.  JP 2 with scant bloody drainage. Colostomy with scant effluent present GU: male purewick in place Ext: trace BLE edema Skin: some jaundice and diffuse anasarca, but significantly improved and looks overall fairly euvolemic on exam Psych: A&Ox3  Lab Results: CBC  Recent Labs    02/22/22 0500 02/23/22 0216  WBC 14.8* 12.3*  HGB 7.7* 7.1*  HCT 24.9* 23.3*  PLT 661* 590*   BMET Recent Labs    02/21/22 0247 02/22/22 0500  NA 143 139  K 5.1 4.3  CL 99 104  CO2 32 27  GLUCOSE 180* 120*  BUN  51* 52*  CREATININE 2.14* 2.07*  CALCIUM 8.0* 7.5*   PT/INR No results for input(s): "LABPROT", "INR" in the last 72 hours.  ABG No results for input(s): "PHART", "HCO3" in the last 72 hours.  Invalid input(s): "PCO2", "PO2"   Studies/Results: No results found.  Anti-infectives: Anti-infectives (From admission, onward)    Start     Dose/Rate Route Frequency Ordered Stop   02/22/22 0800  vancomycin (VANCOREADY) IVPB 1250 mg/250 mL  Status:  Discontinued        1,250 mg 166.7 mL/hr over 90 Minutes Intravenous Every 24 hours 02/21/22 0233 02/23/22 0732   02/21/22 1030  micafungin (MYCAMINE) 150 mg in sodium chloride 0.9 % 100 mL IVPB        150 mg 107.5 mL/hr over 1 Hours Intravenous Every 24 hours 02/21/22 0934     02/21/22 0315  vancomycin (VANCOREADY) IVPB 2000 mg/400 mL        2,000 mg 200 mL/hr over 120 Minutes Intravenous  Once 02/21/22 0229 02/21/22 0502   02/17/22 1200  piperacillin-tazobactam (ZOSYN) IVPB 3.375 g        3.375 g 12.5 mL/hr over 240 Minutes Intravenous Every 8 hours 02/17/22 1103     02/15/22 1500  piperacillin-tazobactam (ZOSYN) IVPB 3.375 g        3.375 g 12.5 mL/hr over 240 Minutes Intravenous Every 8 hours 02/15/22 0836 02/17/22 0217  02/05/2022 1100  piperacillin-tazobactam (ZOSYN) IVPB 3.375 g        3.375 g 100 mL/hr over 30 Minutes Intravenous  Once 01/21/2022 1056 02/03/2022 1230   01/29/2022 0200  piperacillin-tazobactam (ZOSYN) IVPB 3.375 g  Status:  Discontinued        3.375 g 12.5 mL/hr over 240 Minutes Intravenous Every 8 hours 02/17/2022 2322 02/15/22 0836   01/21/2022 1900  piperacillin-tazobactam (ZOSYN) IVPB 3.375 g        3.375 g 100 mL/hr over 30 Minutes Intravenous  Once 01/29/2022 1857 02/12/2022 2012       Assessment/Plan: 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, proximal small bowel with ischemic necrotic proximal jejunum with perforation fecal contamination with abscess  POD 13/4, S/P Exploratory laparotomy with  resection of transverse colon, drainage of mesenteric abscess, resection of proximal jejunum with small bowel anastomosis and 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 by Dr. Brantley Stage.  Re-exploration with washout and new drain placement by Dr. Marlou Starks 2/2 - NPO due leak at small bowel anastomosis . May have a few ice chips, hard candy, and gum - cont NPO/TNA and treat this like a controlled fistula - 3 JP drains present.  LUQ at SB leak site.  Other 2 in places where abscesses were unroofed in OR -WBC 12K, T max 103.1 - TNA, concentrated.  DC extra IVFs - dressing changes to midline wound.  Retentions in place. - WOC following for new ostomy - cont zosyn, Vanc added 2/4, but stopped 2/6 with no definite indications at this time - imodium in place to help slow leak output - surgical path with large B-cell lymphoma -culture from LUQ drain pending, FEW ESCHERICHIA COLI  FEW ENTEROCOCCUS FAECALIS  FEW STREPTOCOCCUS ANGINOSIS  - resistant to amp, bactrim, cipro -PT/OT evals -CBC and CMET daily   AKI - down to 2.07.  labs pending today Fluid overload/hypoalbuminemia - making great improvements.  Monitor closely. Cont full rate TNA, but concentrate this.  Echo reviewed and EF preserved but with some hypokinesis.   LLL PNA - pulm toilet. IS.  Noted on CT.  On zosyn for this.  Repeat CXR today given new fever and worsening hypoxia overnight.  If CXR concerning, may get respiratory culture CAD, s/p CABG - no chest pain, echo as above DM - SSI.  Sugars fairly well controlled Hyperbilirubinemia - unclear etiology but suspect related to bowel leak and reabsorption across peritoneum.  Gallstones noted on CT, but no ductal dilatation.  Other LFTs essentially normal.  Continue to monitor.  Hep panel negative. Dowtrending.  Continue to monitor.  No other work up and intervention at this time. Chronic osteomyelitis of left foot - s/p amputation ABL anemia- 2 units prbc given 1/31, hgb 7.1 today, given  hypotension and orthostasis with PT yesterday, tachycardia, and chronic illness, will give 1 unit today.  Check cbc in am Fevers - PCXR, UA, blood cultures (has picc but necessary for long-term TNA needs)  FEN: NPO, TPN, may have some ice, gum, hard candy VTE: Lovenox ID: Zosyn 1/23>>  Vanc 2/4 -->2/6  given new fevers overnight and increasing pressors, new blood cultures, UA, CXR for work up  Dispo: ICU   The last 24 hours of vitals, I/Os, RN notes, MD notes, labs, etc have been reviewed.    Patrick [name] 8:33 AM 02/23/2022  Please refer to amion for on call pager number between 7-4:15pm

## 2022-02-23 NOTE — Progress Notes (Addendum)
Came by to assess pt Nurse placed on NRB due to sat dropping to mid 80s.  Reports he is mouth breather   BP 116/69 (BP Location: Left Arm)   Pulse (!) 120   Temp (!) 103.1 F (39.5 C) (Oral)   Resp (!) 23   Ht 6' (1.829 m)   Wt 117 kg   SpO2 94%   BMI 34.98 kg/m   Awake, alert, not agitated Ox3 No resp distress No accessory use of muscles Nurse started On small dose levo to maintain MAP >88mHg per prior standing order Abd soft, eakin pouch in place, min TTP, JP 1 - bilious, SB contents; nurse just emptied but rapidly filled up again  HR better than earlier Doesn't appear to be resp distress, not agitated ? If pt would benefit from NG placement given proximal bowel leak Cont iv abx Cont bowel rest Cont tpn  Will order type and screen Tachy diffl - ABL anemia, GI fluid losses, andor worsening sepsis No central line to transduce CVP  Asked nurse to send labs early - hgb had trended down yesterday.   ELeighton [name] WRedmond Pulling MD, FACS General, Bariatric, & Minimally Invasive Surgery [facility]  [place]

## 2022-02-23 NOTE — Progress Notes (Addendum)
Nutrition Follow-up  DOCUMENTATION CODES:   Severe malnutrition in context of acute illness/injury  INTERVENTION:   Continue TPN to meet nutrition needs, discussed with Pharmacy   NUTRITION DIAGNOSIS:   Severe Malnutrition related to acute illness (mass with perforation) as evidenced by moderate fat depletion, energy intake < or equal to 50% for > or equal to 5 days.  Ongoing, being addressed by nutrition support.  GOAL:   Patient will meet greater than or equal to 90% of their needs  Met with TPN.  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  Pt discussed during ICU rounds and with RN.  Met with pt and wife. Pt appears pale, noted receiving blood today.  Pt on 5L O2 via [place] Pt now with small bowel leak with JP drain in placed with almost 1 L output    1/24: s/p ex lap with resection of transverse colon, drainage of mesenteric abscess, resection of proximal jejunum with SB anastomosis and diverting R colostomy and long Hartman's pouch  1/26: started TPN  1/28: NG tube removed 1/30: s/p placement 12 Fr. Pigtail drain in LUQ; diet advanced to clear liquids 1/31: pt changed back to NPO later in afternoon  2/2: s/p ex lap with I&D of abd abscess and 2 JP abd drains placed  Admission wt was 113.4 kg Current weight: 117 kg with mild edema  IV Access: triple lumen PICC right brachial placed 02/12/22  TPN order (02/17/22): Access: Central Dosing weight: 112.4 kg Additives: 10 mL adult MVI, 55 units regular insulin Rate: 100 mL/hour Provides: 2328 kcal, 132  grams protein Noted pt now receiving 1/2 dose trace elements and chromium chloride is still removed from TPN.  Medications reviewed and include: Novolog 0-15 units Q4hrs, magic mouthwash TID, protonix Zosyn   Labs reviewed:  CBG 129-166 Triglycerides 164 2/5 Hgb: 7.1  UOP: 2060 mL (0.6 mL/kg/hr) L JP drain: 920 ml  R JP drain 1: 0 ml R JP drain 2: 5 ml  I/O: +9.5 L since admission    Diet Order:   Diet Order             Diet NPO time specified Except for: Ice Chips, Other (See Comments)  Diet effective now                   EDUCATION NEEDS:   Not appropriate for education at this time  Skin:  Skin Assessment: Skin Integrity Issues: Skin Integrity Issues:: Stage I Stage I: buttocks  Last BM:  25 mL colostomy output in previous 24 hours  Height:   Ht Readings from Last 1 Encounters:  02/03/2022 6' (1.829 m)   Weight:   Wt Readings from Last 1 Encounters:  02/22/22 117 kg   BMI:  Body mass index is 34.98 kg/m.  Estimated Nutritional Needs:   Kcal:  2500-2800  Protein:  140-155 grams  Fluid:  >2 L/day  Lockie Pares., RD, LDN, CNSC See AMiON for contact information

## 2022-02-24 DIAGNOSIS — Z515 Encounter for palliative care: Secondary | ICD-10-CM

## 2022-02-24 DIAGNOSIS — Z7189 Other specified counseling: Secondary | ICD-10-CM | POA: Diagnosis not present

## 2022-02-24 DIAGNOSIS — R1900 Intra-abdominal and pelvic swelling, mass and lump, unspecified site: Secondary | ICD-10-CM | POA: Diagnosis not present

## 2022-02-24 DIAGNOSIS — R410 Disorientation, unspecified: Secondary | ICD-10-CM | POA: Diagnosis not present

## 2022-02-24 LAB — TYPE AND SCREEN
ABO/RH(D): O POS
Antibody Screen: NEGATIVE
Unit division: 0

## 2022-02-24 LAB — CBC
HCT: 27.2 % — ABNORMAL LOW (ref 39.0–52.0)
Hemoglobin: 8.5 g/dL — ABNORMAL LOW (ref 13.0–17.0)
MCH: 25.1 pg — ABNORMAL LOW (ref 26.0–34.0)
MCHC: 31.3 g/dL (ref 30.0–36.0)
MCV: 80.2 fL (ref 80.0–100.0)
Platelets: 583 10*3/uL — ABNORMAL HIGH (ref 150–400)
RBC: 3.39 MIL/uL — ABNORMAL LOW (ref 4.22–5.81)
RDW: 18.9 % — ABNORMAL HIGH (ref 11.5–15.5)
WBC: 11 10*3/uL — ABNORMAL HIGH (ref 4.0–10.5)
nRBC: 0 % (ref 0.0–0.2)

## 2022-02-24 LAB — BASIC METABOLIC PANEL
Anion gap: 8 (ref 5–15)
BUN: 47 mg/dL — ABNORMAL HIGH (ref 6–20)
CO2: 23 mmol/L (ref 22–32)
Calcium: 7.5 mg/dL — ABNORMAL LOW (ref 8.9–10.3)
Chloride: 111 mmol/L (ref 98–111)
Creatinine, Ser: 1.76 mg/dL — ABNORMAL HIGH (ref 0.61–1.24)
GFR, Estimated: 47 mL/min — ABNORMAL LOW (ref >60)
Glucose, Bld: 127 mg/dL — ABNORMAL HIGH (ref 70–99)
Potassium: 3.6 mmol/L (ref 3.5–5.1)
Sodium: 142 mmol/L (ref 135–145)

## 2022-02-24 LAB — GLUCOSE, CAPILLARY
Glucose-Capillary: 118 mg/dL — ABNORMAL HIGH (ref 70–99)
Glucose-Capillary: 145 mg/dL — ABNORMAL HIGH (ref 70–99)
Glucose-Capillary: 147 mg/dL — ABNORMAL HIGH (ref 70–99)
Glucose-Capillary: 151 mg/dL — ABNORMAL HIGH (ref 70–99)
Glucose-Capillary: 158 mg/dL — ABNORMAL HIGH (ref 70–99)
Glucose-Capillary: 164 mg/dL — ABNORMAL HIGH (ref 70–99)

## 2022-02-24 LAB — MAGNESIUM: Magnesium: 2.2 mg/dL (ref 1.7–2.4)

## 2022-02-24 LAB — PHOSPHORUS: Phosphorus: 3.7 mg/dL (ref 2.5–4.6)

## 2022-02-24 LAB — BPAM RBC
Blood Product Expiration Date: 202403082359
ISSUE DATE / TIME: 202402061039
Unit Type and Rh: 5100

## 2022-02-24 LAB — URINE CULTURE: Culture: NO GROWTH

## 2022-02-24 MED ORDER — ORAL CARE MOUTH RINSE: 15.0000 mL | OROMUCOSAL | Status: DC | PRN

## 2022-02-24 MED ORDER — POTASSIUM CHLORIDE 10 MEQ/50ML IV SOLN
10.0000 meq | INTRAVENOUS | Status: AC
  Administered 2022-02-24 (×4): 10 meq via INTRAVENOUS
  Filled 2022-02-24: qty 50

## 2022-02-24 MED ORDER — HYDROMORPHONE HCL 1 MG/ML IJ SOLN
0.5000 mg | INTRAMUSCULAR | Status: DC | PRN
  Administered 2022-02-24 – 2022-02-25 (×6): 1 mg via INTRAVENOUS
  Administered 2022-02-25: 0.5 mg via INTRAVENOUS
  Administered 2022-02-25 – 2022-03-02 (×27): 1 mg via INTRAVENOUS
  Filled 2022-02-24 (×35): qty 1

## 2022-02-24 MED ORDER — TRAVASOL 10 % IV SOLN
INTRAVENOUS | Status: AC
  Filled 2022-02-24: qty 1557.6

## 2022-02-24 NOTE — Progress Notes (Signed)
PHARMACY - TOTAL PARENTERAL NUTRITION CONSULT NOTE  Indication: Prolonged ileus  Patient Measurements: Height: 6' (182.9 cm) Weight: 112.2 kg (247 lb 5.7 oz) IBW/kg (Calculated) : 77.6 TPN AdjBW (KG): 86.6 Body mass index is 33.55 kg/m.  Assessment:  27 YOM presented with abdominal pain for 4 weeks, found to have retroperitoneal mass with necrotic jejunum, perforated fecal contamination and abscess. 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 Patient has been NPO for 4 days since admission. Patient/wife report he wasn't eating well for 4 weeks PTA and basically on a liquid diet. He endorses a ~10 lb weight loss during this time. Previously, he typically ate 2 meals a day (chicken, vegetables, beans and fruits) and 1 snack. Pharmacy consulted to dose TPN.  Glucose / Insulin: hx DM (A1C 6.6) on glipizide PTA. CBGs controlled <180 Received 18 units SSI in past 24 hrs + 55 units regular insulin in TPN bag Decadron IV x1 given preop 2/2 Electrolytes: K 3.6, mag 2.2, NA 142 (trending upward- monitor) others stable WNL Renal: AKI - SCr 1.76 (BL 1), BUN 47 Hepatic: LFTs WNL, Tbili remains elev at 4>5.3, TG down to 164 (propofol postop 2/2>>2/3), albumin <1.5 S/p thiamine x 3 days (1/26>1/28) Intake / Output; MIVF: UOP 0.8 ml/kg/hr, drain 460 mL, colostomy 0 ml; net +9.2 L this admit GI Imaging: 1/23 CT abd pelvis - Marked severity colitis with adjacent abscess  1/29 CT abd pelvis - suspicious for abscesses and PNA, small pleural effusion, anasarca GI Surgeries / Procedures: 1/24 s/p ex lap with resection of transverse colon, drainage of mesenteric abscess, resection of proximal jejunum with SB anastomosis and diverting R colostomy and long Hartman's pouch 1/30 IR per drain placement 2/2 ex lap w/ I&D of abdominal abscess, 2 abdominal drains  placed  Central access: PICC 02/12/22 TPN start date: 02/12/22  Nutritional Goals:  RD Estimated Needs Total Energy Estimated Needs: 2500-2800 Total Protein Estimated Needs: 140-155 grams Total Fluid Estimated Needs: >2 L/day   TPN goal rate: 110 mL/hr to provide 156 g AA and 2514 kCal/day  Current Nutrition:  TPN NPO  Plan:  Increase TPN to goal rate 110 ml/hr to provide 156 g AA, 370 g CHO, 63.4 g lipids and 2514 kCal, meeting 100% of needs Electrolytes in TPN: Na 45 mEq/L, K 23 meq/L, Ca 2 mEq/L, Mg 2 mEq/L, Phos 7 mmol/L; Cl:Ac 1:1.  Add standard MVI to TPN T-bili improved < 6. Jaundice improved but continue 1/2 trace elements until resolved, continue to hold chromium for now but consider adding back as renal function improves.  Continue insulin in TPN at 55 units Continue resistant SSI Q4H Potassium 10 meq runs x4  Monitor TPN labs daily until stable, then standard Mon/Thurs Monitor TG (SMOF lipids 25% of total kCal). Anticipate some degree of TG improvement with stopping propofol CMP, mag, phos, triglycerides tomorrow with AM labs   Saira Kramme BS, PharmD, BCPS Clinical Pharmacist 02/24/2022 9:05 AM  Contact: [phone] after 3 PM  "Be curious, not judgmental..." -[name]

## 2022-02-24 NOTE — Progress Notes (Signed)
Occupational Therapy Treatment Patient Details Name: Patrick [name] MRN: [id number] DOB: 1970/08/20 Today's Date: 02/24/2022   History of present illness Pt is a 52 y/o male admitted 1/24 with progressive anorexia, weight loss and abdominal mass.  1/24 pt s/p exp. Lap. With resection of transverse colon, drainage of mesenteric abscess, resection of proximal jejunum with small bowel anastomosis and diverting right colostomy.   Re-exploration with washout and new drain placement by Dr. Marlou Starks 2/2.  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   OT comments  Pt currently still limited by endurance and medical status.  HR in supine at 120 increasing up to 135 with sitting EOB.  BP sitting initially at 107/65 but decreased to 97/59 after 4-5 mins.  Pt unable to tolerate EOB more than 8 mins total and completed one sit to stand for moving higher up in the bed at mod assist level.  Feel he will continue to benefit from acute care OT at this time with transition to [facility] at discharge.     Recommendations for follow up therapy are one component of a multi-disciplinary discharge planning process, led by the attending physician.  Recommendations may be updated based on patient status, additional functional criteria and insurance authorization.    Follow Up Recommendations  Home health OT     Assistance Recommended at Discharge Frequent or constant Supervision/Assistance  Patient can return home with the following  Assist for transportation;Assistance with cooking/housework;A little help with bathing/dressing/bathroom;A little help with walking and/or transfers   Equipment Recommendations  BSC/3in1;Tub/shower bench       Precautions / Restrictions Precautions Precautions: Fall Precaution Comments: multiple drains, watch O2, HR, and BP Restrictions Weight Bearing Restrictions: No       Mobility Bed Mobility Overal bed mobility:  Needs Assistance Bed Mobility: Rolling, Sidelying to Sit Rolling: Min assist Sidelying to sit: Max assist   Sit to supine: Min assist   General bed mobility comments: Pt needed assist for bringing trunk up to sitting from sidelying position.    Transfers                         Balance Overall balance assessment: Needs assistance Sitting-balance support: Single extremity supported, Bilateral upper extremity supported Sitting balance-Leahy Scale: Poor Sitting balance - Comments: Pt needed UE support sitting EOB and reported feeling like he was going to fall backwards.   Standing balance support: During functional activity, Bilateral upper extremity supported Standing balance-Leahy Scale: Poor Standing balance comment: BUE support needed for standing balance.                           ADL either performed or assessed with clinical judgement   ADL Overall ADL's : Needs assistance/impaired                                       General ADL Comments: Pt with increased HR at 120 in supine, increasing up to 135 with sitting.  Oxygen sats at 92% on 3Ls nasal cannula.  BP in sitting initially at 107/65 and then decreasing to 97/59.  Pt requesting to lay down secondary to not feeling good.  Completed sit to stand with mod assist to step up toward the top of the bed in order to then lay back down  with min assist.  Completed BUE shoulder horizontal abduction and shoulder flexion with use of the medium resistance therapy band once back in supine.  Mod demonstrational cueing needed for 1 set of 10 reps for each.      Cognition Arousal/Alertness: Lethargic Behavior During Therapy: Flat affect Overall Cognitive Status: Within Functional Limits for tasks assessed                                 General Comments: Pt fatigued which limits ability to participate, sometimes slighlty slower with initiation to tasks because of this.                    Pertinent Vitals/ Pain       Pain Assessment Pain Assessment: Faces Faces Pain Scale: Hurts a little bit Pain Location: generalized Pain Descriptors / Indicators: Grimacing Pain Intervention(s): Limited activity within patient's tolerance, Repositioned         Frequency  Min 2X/week        Progress Toward Goals  OT Goals(current goals can now be found in the care plan section)  Progress towards OT goals: OT to reassess next treatment;Not progressing toward goals - comment (limited based on endurance and medical issues)  Acute Rehab OT Goals Patient Stated Goal: Pt did not state OT Goal Formulation: With patient/family Time For Goal Achievement: 02/26/22 Potential to Achieve Goals: Good  Plan Discharge plan needs to be updated;Frequency remains appropriate       AM-PAC OT "6 Clicks" Daily Activity     Outcome Measure   Help from another person eating meals?: None Help from another person taking care of personal grooming?: A Little Help from another person toileting, which includes using toliet, bedpan, or urinal?: A Lot Help from another person bathing (including washing, rinsing, drying)?: A Lot Help from another person to put on and taking off regular upper body clothing?: A Little Help from another person to put on and taking off regular lower body clothing?: A Lot 6 Click Score: 16    End of Session Equipment Utilized During Treatment: Oxygen  OT Visit Diagnosis: Unsteadiness on feet (R26.81);Muscle weakness (generalized) (M62.81);Pain   Activity Tolerance Patient limited by fatigue   Patient Left in bed;with call bell/phone within reach;with family/visitor present   Nurse Communication Mobility status (BP readings)        Time: 1115-1150 OT Time Calculation (min): 35 min  Charges: OT General Charges $OT Visit: 1 Visit OT Treatments $Self Care/Home Management : 8-22 mins $Therapeutic Activity: 8-22 mins  Clyda Greener, OTR/L [place]  Office [phone] 02/24/2022

## 2022-02-24 NOTE — Progress Notes (Signed)
Patient ID: Patrick [name], male   DOB: 1970/10/11, 52 y.o.   MRN: [id number] 5 Days Post-Op    Subjective: No new complaints today.  Down to 3L [place].  Voiding well.  Temp down to 101.5 overnight, max  Objective: Vital signs in last 24 hours: Temp:  [99.2 F (37.3 C)-101.5 F (38.6 C)] 101.2 F (38.4 C) (02/07 0400) Pulse Rate:  [104-127] 123 (02/07 0800) Resp:  [15-30] 23 (02/07 0800) BP: (96-140)/(56-75) 110/64 (02/07 0800) SpO2:  [90 %-100 %] 91 % (02/07 0800) Weight:  [112.2 kg] 112.2 kg (02/07 0500) Last BM Date :  (colostomy)  Intake/Output from previous day: 02/06 0701 - 02/07 0700 In: 3069.9 [P.O.:90; I.V.:2324.9; Blood:378; IV Piggyback:277] Out: 2660 [Urine:2200; Drains:460] Intake/Output this shift: Total I/O In: 12.5 [IV Piggyback:12.5] Out: -   Gen: NAD, fatigued Heart: regular, but tachy in 120s today Lungs: CTAB.  On 3L [place] Abd: soft, wound clean with retention sutures in place.   LUQ drain with bilious output.  425cc/24hrs  JP 3 with a small amount of bilious drainage, 30cc in 24 hrs.  JP 2 with scant bloody drainage. Colostomy with scant effluent present GU: male purewick in place, clear yellow urine Ext: trace BLE edema Skin: some jaundice and diffuse anasarca, but significantly improved and looks overall fairly euvolemic on exam Psych: A&Ox3  Lab Results: CBC  Recent Labs    02/23/22 0216 02/23/22 1527 02/24/22 0519  WBC 12.3*  --  11.0*  HGB 7.1* 8.0* 8.5*  HCT 23.3* 27.6* 27.2*  PLT 590*  --  583*   BMET Recent Labs    02/23/22 0825 02/24/22 0519  NA 142 142  K 3.7 3.6  CL 109 111  CO2 24 23  GLUCOSE 140* 127*  BUN 47* 47*  CREATININE 1.86* 1.76*  CALCIUM 7.6* 7.5*   PT/INR No results for input(s): "LABPROT", "INR" in the last 72 hours.  ABG No results for input(s): "PHART", "HCO3" in the last 72 hours.  Invalid input(s): "PCO2", "PO2"   Studies/Results: DG CHEST PORT 1 VIEW  Result Date: 02/23/2022 CLINICAL DATA:  Hypoxia.  EXAM: PORTABLE CHEST 1 VIEW COMPARISON:  Chest x-ray 02/17/2022 FINDINGS: Stable surgical changes from triple bypass surgery. Right PICC line is stable. The lungs demonstrate much improved aeration since the prior chest film. Low lung volumes with vascular crowding and bibasilar atelectasis but no infiltrates or effusions. IMPRESSION: Low lung volumes with vascular crowding and bibasilar atelectasis. Electronically Signed   By: Marijo Sanes M.D.   On: 02/23/2022 10:07    Anti-infectives: Anti-infectives (From admission, onward)    Start     Dose/Rate Route Frequency Ordered Stop   02/22/22 0800  vancomycin (VANCOREADY) IVPB 1250 mg/250 mL  Status:  Discontinued        1,250 mg 166.7 mL/hr over 90 Minutes Intravenous Every 24 hours 02/21/22 0233 02/23/22 0732   02/21/22 1030  micafungin (MYCAMINE) 150 mg in sodium chloride 0.9 % 100 mL IVPB  Status:  Discontinued        150 mg 107.5 mL/hr over 1 Hours Intravenous Every 24 hours 02/21/22 0934 02/24/22 0821   02/21/22 0315  vancomycin (VANCOREADY) IVPB 2000 mg/400 mL        2,000 mg 200 mL/hr over 120 Minutes Intravenous  Once 02/21/22 0229 02/21/22 0502   02/17/22 1200  piperacillin-tazobactam (ZOSYN) IVPB 3.375 g        3.375 g 12.5 mL/hr over 240 Minutes Intravenous Every 8 hours 02/17/22 1103  02/15/22 1500  piperacillin-tazobactam (ZOSYN) IVPB 3.375 g        3.375 g 12.5 mL/hr over 240 Minutes Intravenous Every 8 hours 02/15/22 0836 02/17/22 0217   01/26/2022 1100  piperacillin-tazobactam (ZOSYN) IVPB 3.375 g        3.375 g 100 mL/hr over 30 Minutes Intravenous  Once 02/16/2022 1056 02/07/2022 1230   02/03/2022 0200  piperacillin-tazobactam (ZOSYN) IVPB 3.375 g  Status:  Discontinued        3.375 g 12.5 mL/hr over 240 Minutes Intravenous Every 8 hours 01/20/2022 2322 02/15/22 0836   02/17/2022 1900  piperacillin-tazobactam (ZOSYN) IVPB 3.375 g        3.375 g 100 mL/hr over 30 Minutes Intravenous  Once 01/28/2022 1857 02/05/2022 2012        Assessment/Plan: 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, proximal small bowel with ischemic necrotic proximal jejunum with perforation fecal contamination with abscess  POD 14/5, S/P Exploratory laparotomy with resection of transverse colon, drainage of mesenteric abscess, resection of proximal jejunum with small bowel anastomosis and 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 by Dr. Brantley Stage.  Re-exploration with washout and new drain placement by Dr. Marlou Starks 2/2 - NPO due leak at small bowel anastomosis . May have a few ice chips, hard candy, and gum - cont NPO/TNA and treat this like a controlled fistula - 3 JP drains present.  LUQ at SB leak site.  Other 2 in places where abscesses were unroofed in OR -WBC 11K, T max 101.5 - TNA, concentrated.  DC extra IVFs - dressing changes to midline wound.  Retentions in place. - WOC following for new ostomy - cont zosyn, Vanc added 2/4, but stopped 2/6 with no definite indications at this time.  DC micafungin as well.  No cultures with fungus present - surgical path with large B-cell lymphoma -culture from LUQ drain pending, FEW ESCHERICHIA COLI  FEW ENTEROCOCCUS FAECALIS  FEW STREPTOCOCCUS ANGINOSIS  - resistant to amp, bactrim, cipro -PT/OT evals -CBC and BMET daily   AKI - down to 1.78 Fluid overload/hypoalbuminemia - making great improvements.  Monitor closely. Cont full rate TNA, but concentrate this.  Echo reviewed and EF preserved but with some hypokinesis.   LLL PNA - pulm toilet. IS.  Noted on CT.  On zosyn for this.  Repeat CXR with some vascular congestion but no further evidence of infiltrate CAD, s/p CABG - no chest pain, echo as above DM - SSI.  Sugars fairly well controlled Hyperbilirubinemia - unclear etiology but suspect related to bowel leak and reabsorption across peritoneum.  Gallstones noted on CT, but no ductal dilatation.  Other LFTs essentially normal.  Continue to monitor.  Hep panel  negative. Dowtrending.  Continue to monitor.  No other work up and intervention at this time. Chronic osteomyelitis of left foot - s/p amputation ABL anemia- 2 units prbc given 1/31, hgb 8.5 today, after 1 unit on 2/6 Fevers - PCXR, UA, blood cultures (has picc but necessary for long-term TNA needs).  PCXR and UA so far negative.  Blood cxs still pending.  Fevers downtrending.  monitor  FEN: NPO, TPN, may have some ice, gum, hard candy VTE: Lovenox ID: Zosyn 1/23>>  Vanc 2/4 -->2/6  given new fevers overnight and increasing pressors, new blood cultures, UA, CXR for work up.  Micafungin 2/4-->2/7  Dispo: ICU   The last 24 hours of vitals, I/Os, RN notes, MD notes, labs, etc have been reviewed.    Henreitta Cea 8:22  AM 02/24/2022  Please refer to amion for on call pager number between 7-4:15pm

## 2022-02-24 NOTE — Consult Note (Signed)
Consultation Note Date: 02/24/2022   Patient Name: Patrick [name]  DOB: 03-08-1970  MRN: [id number]  Age / Sex: 52 y.o., male  PCP: Caryl Bis, MD Referring Physician: Edison Pace Md, MD  Reason for Consultation: Establishing goals of care   HPI/Brief Hospital Course: 52 y.o. male  with past medical history of T2DM, s/p left transmetatarsal amputation secondary to osteomyelitis and CAD s/p CABG. Admitted on 02/03/2022 with severe abdominal pain. Initially presented to PCP who sent for CT with findings concerning for pneumatosis and possible pneumoperitoneum and directed to come to ED.  1/24: Underwent exploratory laparotomy with resection of transverse colon, drainage of mesenteric abscess, resection of proximal jejunum with small bowel anastomosis and 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. Findings of retroperitoneal mass with invasion into transverse colon, proximal small bowel with ischemic necrotic proximal jejunum with perforation fecal contamination with abscess.  2/2: Underwent exploratory laparotomy with abdominal irrigation and debridement, drainage intraabdominal abscess  JP drains remain in place due to leak Remains on levophed NPO with TPN Acute delirium slowly improving  Palliative medicine was consulted for assisting with goals of care conversations.  Subjective:  Extensive chart review has been completed prior to meeting patient including labs, vital signs, imaging, progress notes, orders, and available advanced directive documents from current and previous encounters.  Introduced myself as a Designer, jewellery as a member of the palliative care team. Explained palliative medicine is specialized medical care for people living with serious illness. It focuses on providing relief from the symptoms and stress of a serious illness. The goal is to improve quality of life for both the patient and the family.   Visited with Mr. Sciandra at his  bedside. Able to participate some in conversations but would easily fall back to sleep without redirection. Difficulty communicating due to dry mouth-relieved some with mouth swabs. Able to recall days of week backwards with minimal redirection. Began outlining his understanding of medical condition but became fatigued.  Karen/wife, Judy/mother and Judy's partner at bedside for meeting. Santiago Glad shared a brief life review. They have been married since 2022-but have known each other for many years, since high school. Santiago Glad has 2 boys from a previous relationship but have a strong bond with Jerrye Beavers. 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 Santiago Glad and Kanye enjoyed traveling to [employer] and mountains in their free time.  Santiago Glad and Bethena Roys able to share their understanding of current medical condition. Understand biopsy reports confirmed malignancy and that Khalon will need to undergo treatment once more stable and stronger. Able to share their understanding of latest procedure-unable to completely repair leak for which JP drains remain in place.  Discussed advanced directives-Hyde has not completed documentation in the past but wishes to appoint Santiago Glad as his HCPOA. Interested in completing a living will during his hospitalization as well-will need ongoing conversations and further improvement in delirium. Briefly discussed short term and long term goals-wish to continue to allow Stacey to rest and heal with hopes to be strong enough to undergo treatment. Long term goals to return home and reopen business. Briefly discussed possibilities of recurrent/new complications and wishes/desires in those circumstances. They explained they have not thought much about this and will need more time to process and communicate with each other. Explained PMT available to assist with ongoing conversations and will check in daily for ongoing needs and support.  I discussed importance of continued  conversations with family/support persons and all members of their medical  team regarding overall plan of care and treatment options ensuring decisions are in alignment with patients goals of care.  Faith is an important part of their life-have had leaders from their church visit during hospitalization.  All questions/concerns addressed. Emotional support provided to patient/family/support persons. PMT will continue to follow and support patient as needed.  Objective: Primary Diagnoses: Present on Admission:  Abdominal mass  Physical Exam Constitutional:      General: He is not in acute distress.    Appearance: He is ill-appearing.  Cardiovascular:     Rate and Rhythm: Tachycardia present.  Pulmonary:     Effort: Pulmonary effort is normal. No respiratory distress.  Skin:    General: Skin is warm and dry.  Neurological:     Mental Status: He is alert.    Vital Signs: BP 122/69   Pulse (!) 119   Temp (!) 101.1 F (38.4 C) (Oral)   Resp (!) 27   Ht 6' (1.829 m)   Wt 112.2 kg   SpO2 94%   BMI 33.55 kg/m  Pain Scale: 0-10 POSS *See Group Information*: 2-Acceptable,Slightly drowsy, easily aroused Pain Score: Asleep     Palliative Assessment/Data: 55%   Assessment and Plan  SUMMARY OF RECOMMENDATIONS   Full Code-continue full scope of treatment AD/HCPOA to be completed once delirium cleared PMT to continue to follow for ongoing needs and support  Thank you for this consult and allowing Palliative Medicine to participate in the care of Patrick [name]. [place]. Palliative medicine will continue to follow and assist as needed.   Time Total: 75 minutes Greater than 50%  of this time was spent counseling and coordinating care related to the above assessment and plan.  Signed by: Theodoro Grist, DNP, AGNP-C Palliative Medicine    Please contact Palliative Medicine Team phone at [phone] for questions and concerns.  For individual provider: See Shea Evans

## 2022-02-25 ENCOUNTER — Inpatient Hospital Stay (HOSPITAL_COMMUNITY): Payer: BC Managed Care – PPO

## 2022-02-25 DIAGNOSIS — Z515 Encounter for palliative care: Secondary | ICD-10-CM | POA: Diagnosis not present

## 2022-02-25 LAB — POCT I-STAT 7, (LYTES, BLD GAS, ICA,H+H)
Acid-base deficit: 3 mmol/L — ABNORMAL HIGH (ref 0.0–2.0)
Acid-base deficit: 4 mmol/L — ABNORMAL HIGH (ref 0.0–2.0)
Bicarbonate: 20.4 mmol/L (ref 20.0–28.0)
Bicarbonate: 20.3 mmol/L (ref 20.0–28.0)
Calcium, Ion: 1.1 mmol/L — ABNORMAL LOW (ref 1.15–1.40)
Calcium, Ion: 1.18 mmol/L (ref 1.15–1.40)
HCT: 23 % — ABNORMAL LOW (ref 39.0–52.0)
HCT: 25 % — ABNORMAL LOW (ref 39.0–52.0)
Hemoglobin: 7.8 g/dL — ABNORMAL LOW (ref 13.0–17.0)
Hemoglobin: 8.5 g/dL — ABNORMAL LOW (ref 13.0–17.0)
O2 Saturation: 87 %
O2 Saturation: 97 %
Patient temperature: 102
Patient temperature: 99.8
Potassium: 3.9 mmol/L (ref 3.5–5.1)
Potassium: 4 mmol/L (ref 3.5–5.1)
Sodium: 147 mmol/L — ABNORMAL HIGH (ref 135–145)
Sodium: 148 mmol/L — ABNORMAL HIGH (ref 135–145)
TCO2: 21 mmol/L — ABNORMAL LOW (ref 22–32)
TCO2: 21 mmol/L — ABNORMAL LOW (ref 22–32)
pCO2 arterial: 33.1 mmHg (ref 32–48)
pCO2 arterial: 34.9 mmHg (ref 32–48)
pH, Arterial: 7.407 (ref 7.35–7.45)
pH, Arterial: 7.376 (ref 7.35–7.45)
pO2, Arterial: 57 mmHg — ABNORMAL LOW (ref 83–108)
pO2, Arterial: 96 mmHg (ref 83–108)

## 2022-02-25 LAB — MAGNESIUM: Magnesium: 2.3 mg/dL (ref 1.7–2.4)

## 2022-02-25 LAB — CBC
HCT: 25.2 % — ABNORMAL LOW (ref 39.0–52.0)
Hemoglobin: 8 g/dL — ABNORMAL LOW (ref 13.0–17.0)
MCH: 25.4 pg — ABNORMAL LOW (ref 26.0–34.0)
MCHC: 31.7 g/dL (ref 30.0–36.0)
MCV: 80 fL (ref 80.0–100.0)
Platelets: 601 10*3/uL — ABNORMAL HIGH (ref 150–400)
RBC: 3.15 MIL/uL — ABNORMAL LOW (ref 4.22–5.81)
RDW: 19.1 % — ABNORMAL HIGH (ref 11.5–15.5)
WBC: 10.4 10*3/uL (ref 4.0–10.5)
nRBC: 0 % (ref 0.0–0.2)

## 2022-02-25 LAB — GLUCOSE, CAPILLARY
Glucose-Capillary: 133 mg/dL — ABNORMAL HIGH (ref 70–99)
Glucose-Capillary: 151 mg/dL — ABNORMAL HIGH (ref 70–99)
Glucose-Capillary: 163 mg/dL — ABNORMAL HIGH (ref 70–99)
Glucose-Capillary: 162 mg/dL — ABNORMAL HIGH (ref 70–99)
Glucose-Capillary: 180 mg/dL — ABNORMAL HIGH (ref 70–99)

## 2022-02-25 LAB — COMPREHENSIVE METABOLIC PANEL
ALT: 27 U/L (ref 0–44)
AST: 45 U/L — ABNORMAL HIGH (ref 15–41)
Albumin: <1.5 g/dL — ABNORMAL LOW (ref 3.5–5.0)
Alkaline Phosphatase: 185 U/L — ABNORMAL HIGH (ref 38–126)
Anion gap: 16 — ABNORMAL HIGH (ref 5–15)
BUN: 49 mg/dL — ABNORMAL HIGH (ref 6–20)
CO2: 19 mmol/L — ABNORMAL LOW (ref 22–32)
Calcium: 7.4 mg/dL — ABNORMAL LOW (ref 8.9–10.3)
Chloride: 109 mmol/L (ref 98–111)
Creatinine, Ser: 1.84 mg/dL — ABNORMAL HIGH (ref 0.61–1.24)
GFR, Estimated: 44 mL/min — ABNORMAL LOW (ref >60)
Glucose, Bld: 148 mg/dL — ABNORMAL HIGH (ref 70–99)
Potassium: 3.8 mmol/L (ref 3.5–5.1)
Sodium: 144 mmol/L (ref 135–145)
Total Bilirubin: 6 mg/dL — ABNORMAL HIGH (ref 0.3–1.2)
Total Protein: 6 g/dL — ABNORMAL LOW (ref 6.5–8.1)

## 2022-02-25 LAB — PHOSPHORUS: Phosphorus: 3.1 mg/dL (ref 2.5–4.6)

## 2022-02-25 LAB — TRIGLYCERIDES: Triglycerides: 168 mg/dL — ABNORMAL HIGH (ref <150)

## 2022-02-25 MED ORDER — FUROSEMIDE 10 MG/ML IJ SOLN
40.0000 mg | Freq: Once | INTRAMUSCULAR | Status: AC
  Administered 2022-02-25: 40 mg via INTRAVENOUS
  Filled 2022-02-25: qty 4

## 2022-02-25 MED ORDER — TRACE MINERALS CU-MN-SE-ZN 300-55-60-3000 MCG/ML IV SOLN
INTRAVENOUS | Status: AC
  Filled 2022-02-25: qty 992.8

## 2022-02-25 MED ORDER — TRACE MINERALS CU-MN-SE-ZN 300-55-60-3000 MCG/ML IV SOLN: INTRAVENOUS | Status: DC

## 2022-02-25 MED ORDER — DAKINS (1/4 STRENGTH) 0.125 % EX SOLN
Freq: Three times a day (TID) | CUTANEOUS | Status: AC
  Filled 2022-02-25: qty 473

## 2022-02-25 MED ORDER — POTASSIUM CHLORIDE 10 MEQ/50ML IV SOLN
10.0000 meq | INTRAVENOUS | Status: AC
  Administered 2022-02-25 (×2): 10 meq via INTRAVENOUS
  Filled 2022-02-25 (×2): qty 50

## 2022-02-25 MED ORDER — TRACE MINERALS CU-MN-SE-ZN 300-55-60-3000 MCG/ML IV SOLN
INTRAVENOUS | Status: DC
  Filled 2022-02-25: qty 991.2

## 2022-02-25 NOTE — Progress Notes (Addendum)
PHARMACY - TOTAL PARENTERAL NUTRITION CONSULT NOTE  Indication: Prolonged ileus  Patient Measurements: Height: 6' (182.9 cm) Weight: 111.9 kg (246 lb 11.1 oz) IBW/kg (Calculated) : 77.6 TPN AdjBW (KG): 86.6 Body mass index is 33.46 kg/m.  Assessment:  43 YOM presented with abdominal pain for 4 weeks, found to have retroperitoneal mass with necrotic jejunum, perforated fecal contamination and abscess. 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 Patient has been NPO for 4 days since admission. Patient/wife report he wasn't eating well for 4 weeks PTA and basically on a liquid diet. He endorses a ~10 lb weight loss during this time. Previously, he typically ate 2 meals a day (chicken, vegetables, beans and fruits) and 1 snack. Pharmacy consulted to dose TPN.  Glucose / Insulin: hx DM (A1C 6.6) on glipizide PTA. CBGs controlled <180 Received 21 units SSI in past 24 hrs + 55 units regular insulin in TPN bag Decadron IV x1 given preop 2/2 Electrolytes: K 3.8, mag 2.3, NA 144 (trending upward- monitor), Phos trending downward 3.1 others stable WNL Renal: AKI - SCr 1.84 (BL 1), BUN 49 Hepatic: LFTs WNL, Tbili remains elev at 6.0, TG 168 (propofol postop 2/2>>2/3), albumin <1.5 S/p thiamine x 3 days (1/26>1/28) Intake / Output; MIVF: UOP 0.89 ml/kg/hr, drain 525 mL, colostomy 0 ml; net +7.9 L this admit GI Imaging: 1/23 CT abd pelvis - Marked severity colitis with adjacent abscess  1/29 CT abd pelvis - suspicious for abscesses and PNA, small pleural effusion, anasarca GI Surgeries / Procedures: 1/24 s/p ex lap with resection of transverse colon, drainage of mesenteric abscess, resection of proximal jejunum with SB anastomosis and diverting R colostomy and long Hartman's pouch 1/30 IR per drain placement 2/2 ex lap w/ I&D of abdominal abscess, 2  abdominal drains placed  Central access: PICC 02/12/22 TPN start date: 02/12/22  Nutritional Goals:  RD Estimated Needs Total Energy Estimated Needs: 2500-2800 Total Protein Estimated Needs: 140-155 grams Total Fluid Estimated Needs: >2 L/day   TPN goal rate: 85 mL/hr to provide 149 g AA and 2574 kCal/day  Current Nutrition:  TPN- concentrated starting 02/25/2022 PM  NPO  Plan:  Decrease TPN to a rate 85 ml/hr (concentrated formula) to provide 149 g AA, 408 g CHO, 59 g lipids and 2574 kCal, meeting 100% of needs Electrolytes in TPN: Na 58 mEq/L, K 30 meq/L, Ca 2.5 mEq/L, Mg 1.5 mEq/L, Phos 9 mmol/L; Cl:Ac maximized chloride.  Add standard MVI to TPN T-bili 6. Jaundice improved but continue 1/2 trace elements until resolved, continue to hold chromium for now but consider adding back as renal function improves.  Continue insulin in TPN at 55 units Continue resistant SSI Q4H Potassium 10 meq runs x2  Monitor TPN labs daily until stable, then standard Mon/Thurs Monitor TG (SMOF lipids 25% of total kCal). Anticipate some degree of TG improvement with stopping propofol Bmet tomorrow with AM labs   Patrick [name] BS, PharmD, BCPS Clinical Pharmacist 02/25/2022 10:11 AM  Contact: [phone] after 3 PM  "Be curious, not judgmental..." -[name]

## 2022-02-25 NOTE — Progress Notes (Addendum)
   Palliative Medicine Inpatient Follow Up Note  HPI: 52 y.o. male  with past medical history of T2DM, s/p left transmetatarsal amputation secondary to osteomyelitis and CAD s/p CABG. Admitted on 01/21/2022 with severe abdominal pain. Initially presented to PCP who sent for CT with findings concerning for pneumatosis and possible pneumoperitoneum and directed to come to ED.   1/24: Underwent exploratory laparotomy with resection of transverse colon, drainage of mesenteric abscess, resection of proximal jejunum with small bowel anastomosis and 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. Findings of retroperitoneal mass with invasion into transverse colon, proximal small bowel with ischemic necrotic proximal jejunum with perforation fecal contamination with abscess.   2/2: Underwent exploratory laparotomy with abdominal irrigation and debridement, drainage intraabdominal abscess   JP drains remain in place due to leak Remains on levophed NPO with TPN Acute delirium slowly improving   Palliative medicine was consulted for assisting with goals of care conversations.  Today's Discussion 02/25/2022  *Please note that this is a verbal dictation therefore any spelling or grammatical errors are due to the "[place]" system interpretation.  Chart reviewed inclusive of vital signs, progress notes, laboratory results, and diagnostic images. T Max 102 overnight, increased WOB, (+) comp resp alkalosis/met. acidosis.   I met this morning with Patrick [name] and his spouse, Patrick [name]. Created space and opportunity for patient and spouse to explore thoughts feelings and fears regarding current medical situation.  Patrick [name] was notably jaundiced and somewhat disoriented.He has dry mouth which he wants ice chips for. Reviewed that he is NPO though nursing can swab his mouth as needed.   Offered support given the difficulty of patients present situation.   Per discussion with RN, Patrick [name] intubation has been  discussed with Dr. Bobbye [name] and patient/ family  who would like to pursue intubation if warranted in the near future.   Goals remain for full scope of care.  Questions and concerns addressed/Palliative Support Provided.   Objective Assessment: Vital Signs Vitals:   02/25/22 0900 02/25/22 1000  BP: 114/69 120/66  Pulse: (!) 114 (!) 113  Resp: 19 (!) 24  Temp:    SpO2: 92% 94%    Intake/Output Summary (Last 24 hours) at 02/25/2022 1058 Last data filed at 02/25/2022 1035 Gross per 24 hour  Intake 2335.28 ml  Output 3255 ml  Net -919.72 ml   Last Weight  Most recent update: 02/25/2022  5:23 AM    Weight  111.9 kg (246 lb 11.1 oz)            Gen: Exceptionally ill appearing caucasian M in moderate distress HEENT: sclera jaundiced, dry mucous membranes CV: Irregular rate and rhythm PULM: On humidified O2,  Tachypnea EXT: (+) anasarca Neuro: Alert and oriented  to self  SUMMARY OF RECOMMENDATIONS   Full Code-continue full scope of treatment   Institute delirium precautions  Wet mouth PRN for comfort  Support provided  PMT will continue to follow  Billing based on MDM: High ______________________________________________________________________________________ [place] Team Team Cell Phone: [phone] Please utilize secure chat with additional questions, if there is no response within 30 minutes please call the above phone number  Palliative Medicine Team providers are available by phone from 7am to 7pm daily and can be reached through the team cell phone.  Should this patient require assistance outside of these hours, please call the patient's attending physician.

## 2022-02-25 NOTE — Progress Notes (Signed)
Patient ID: Patrick [name], male   DOB: 1970/09/16, 52 y.o.   MRN: [id number] 6 Days Post-Op    Subjective: Patient with increasing confusion today.  More SOB and having more trouble breathing he says.  Very difficult to understand what he says today. Appears to be secondary to severe dry mouth.  Tmax 102 overnight.  Objective: Vital signs in last 24 hours: Temp:  [99.6 F (37.6 C)-102 F (38.9 C)] 102 F (38.9 C) (02/08 0400) Pulse Rate:  [108-139] 113 (02/08 0800) Resp:  [18-46] 30 (02/08 0800) BP: (89-137)/(51-74) 106/61 (02/08 0800) SpO2:  [89 %-100 %] 91 % (02/08 0800) Weight:  [111.9 kg] 111.9 kg (02/08 0500) Last BM Date :  (no output in ostomy yet)  Intake/Output from previous day: 02/07 0701 - 02/08 0700 In: 2683.2 [P.O.:90; I.V.:2271.7; IV Piggyback:321.4] Out: 2930 [Urine:2400; Drains:530] Intake/Output this shift: No intake/output data recorded.  Gen: fatigued, increase WOB  Heart: regular, but tachy in 1110s Lungs: CTAB.  But tachpnea and increase WOB.  Sats around 90-92% on 4L [place] Abd: soft, wound with succus draining from mid portion of the wound with significant pseudomonal coverage on the dressing as well.  The rest of the tissue is beefy red and retentions in place.  LUQ drain with bilious output.  525cc/24hrs  JP 3 with a small amount of bilious drainage, 5cc in 24 hrs.  JP 2 with scant bloody drainage. Colostomy with scant effluent present GU: male purewick in place, clear yellow urine Ext: trace BLE edema Skin: some jaundice  Psych: somnolent, but will awaken and carry on a conversation.  Words are mumbled, but slightly improved after mouth care.  Can't remember that he is in [facility] hospital.  Will say [employer] at times, but then says medical facility and [place].  Oriented to date and president.  Some of his conversation is random and doesn't have anything to do with what we are discussing  Lab Results: CBC  Recent Labs    02/24/22 0519 02/25/22 0418  WBC 11.0*  10.4  HGB 8.5* 8.0*  HCT 27.2* 25.2*  PLT 583* 601*   BMET Recent Labs    02/24/22 0519 02/25/22 0418  NA 142 144  K 3.6 3.8  CL 111 109  CO2 23 19*  GLUCOSE 127* 148*  BUN 47* 49*  CREATININE 1.76* 1.84*  CALCIUM 7.5* 7.4*   PT/INR No results for input(s): "LABPROT", "INR" in the last 72 hours.  ABG No results for input(s): "PHART", "HCO3" in the last 72 hours.  Invalid input(s): "PCO2", "PO2"   Studies/Results: DG CHEST PORT 1 VIEW  Result Date: 02/23/2022 CLINICAL DATA:  Hypoxia. EXAM: PORTABLE CHEST 1 VIEW COMPARISON:  Chest x-ray 02/17/2022 FINDINGS: Stable surgical changes from triple bypass surgery. Right PICC line is stable. The lungs demonstrate much improved aeration since the prior chest film. Low lung volumes with vascular crowding and bibasilar atelectasis but no infiltrates or effusions. IMPRESSION: Low lung volumes with vascular crowding and bibasilar atelectasis. Electronically Signed   By: Marijo Sanes M.D.   On: 02/23/2022 10:07    Anti-infectives: Anti-infectives (From admission, onward)    Start     Dose/Rate Route Frequency Ordered Stop   02/22/22 0800  vancomycin (VANCOREADY) IVPB 1250 mg/250 mL  Status:  Discontinued        1,250 mg 166.7 mL/hr over 90 Minutes Intravenous Every 24 hours 02/21/22 0233 02/23/22 0732   02/21/22 1030  micafungin (MYCAMINE) 150 mg in sodium chloride 0.9 % 100 mL  IVPB  Status:  Discontinued        150 mg 107.5 mL/hr over 1 Hours Intravenous Every 24 hours 02/21/22 0934 02/24/22 0821   02/21/22 0315  vancomycin (VANCOREADY) IVPB 2000 mg/400 mL        2,000 mg 200 mL/hr over 120 Minutes Intravenous  Once 02/21/22 0229 02/21/22 0502   02/17/22 1200  piperacillin-tazobactam (ZOSYN) IVPB 3.375 g        3.375 g 12.5 mL/hr over 240 Minutes Intravenous Every 8 hours 02/17/22 1103     02/15/22 1500  piperacillin-tazobactam (ZOSYN) IVPB 3.375 g        3.375 g 12.5 mL/hr over 240 Minutes Intravenous Every 8 hours 02/15/22 0836  02/17/22 0217   02/05/2022 1100  piperacillin-tazobactam (ZOSYN) IVPB 3.375 g        3.375 g 100 mL/hr over 30 Minutes Intravenous  Once 01/20/2022 1056 02/01/2022 1230   02/03/2022 0200  piperacillin-tazobactam (ZOSYN) IVPB 3.375 g  Status:  Discontinued        3.375 g 12.5 mL/hr over 240 Minutes Intravenous Every 8 hours 02/08/2022 2322 02/15/22 0836   01/30/2022 1900  piperacillin-tazobactam (ZOSYN) IVPB 3.375 g        3.375 g 100 mL/hr over 30 Minutes Intravenous  Once 02/01/2022 1857 01/29/2022 2012       Assessment/Plan: 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, proximal small bowel with ischemic necrotic proximal jejunum with perforation fecal contamination with abscess  POD 15/6, S/P Exploratory laparotomy with resection of transverse colon, drainage of mesenteric abscess, resection of proximal jejunum with small bowel anastomosis and 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 by Dr. Brantley Stage.  Re-exploration with washout and new drain placement by Dr. Marlou Starks 2/2 - NPO due leak at small bowel anastomosis . May have a few ice chips, hard candy, and gum - cont NPO/TNA and treat this like a controlled fistula - 3 JP drains present.  LUQ at SB leak site.  Other 2 in places where abscesses were unroofed in OR.  Unfortunately now with succus draining from midline wound as well. -WBC 10K, T max 102 - TNA, concentrated.  DC extra IVFs - dressing changes to midline wound, add Dakins due to pseudomonas on dressing.  Retentions in place.  Monitor output from midline wound.  If becomes extensive may need to place Eakin's pouch  - WOC following for new ostomy - cont zosyn, Vanc added 2/4, but stopped 2/6 with no definite indications at this time.  DC micafungin as well.  No cultures with fungus present - surgical path with large B-cell lymphoma -culture from LUQ drain pending, FEW ESCHERICHIA COLI  FEW ENTEROCOCCUS FAECALIS  FEW STREPTOCOCCUS ANGINOSIS  - resistant to amp, bactrim,  cipro -PT/OT evals -CBC and BMET daily   AKI - 1.84 today Fluid overload/hypoalbuminemia - making great improvements.  Monitor closely. Cont full rate TNA, but concentrate this.  Echo reviewed and EF preserved but with some hypokinesis.   Hypoxia/LLL PNA(resolved) - pulm toilet. IS.  PNA resolved.  Increase WOB and with increase in anion gap and decrease in CO2.  Repeat CXR which hasn't been read but appears to have more congestion.  ABG pending. CAD, s/p CABG - no chest pain, echo as above DM - SSI.  Sugars fairly well controlled Hyperbilirubinemia - unclear etiology but suspect related to bowel leak and reabsorption across peritoneum.  Gallstones noted on CT, but no ductal dilatation.  Other LFTs essentially normal.  Continue to monitor.  Hep panel  negative. Dowtrending.  Continue to monitor.  No other work up and intervention at this time. Chronic osteomyelitis of left foot - s/p amputation ABL anemia- 2 units prbc given 1/31, hgb 8.0 today, after 1 unit on 2/6 Fevers - PCXR, UA, blood cultures (has picc but necessary for long-term TNA needs).  PCXR and UA so far negative.  Blood cxs still pending but negative so far.  Fevers back up to 102 overnight.  monitor  FEN: NPO, TPN, may have some ice, gum, hard candy VTE: Lovenox ID: Zosyn 1/23>>  Vanc 2/4 -->2/6  given new fevers overnight and increasing pressors, new blood cultures, UA, CXR for work up.  Micafungin 2/4-->2/7  Dispo: ICU   The last 24 hours of vitals, I/Os, RN notes, MD notes, labs, etc have been reviewed.    Henreitta Cea 8:17 AM 02/25/2022  Please refer to amion for on call pager number between 7-4:15pm

## 2022-02-25 NOTE — Progress Notes (Signed)
PT Cancellation Note  Patient Details Name: Patrick [name] MRN: [id number] DOB: 11-Apr-1970   Cancelled Treatment:    Reason Eval/Treat Not Completed: Patient not medically ready.  Poor Blood Gases and pt having difficulty with WOB and SpO2. 02/25/2022  Ginger Carne., PT Acute Rehabilitation Services [phone]  (office)   [name] Oaklan Persons 02/25/2022, 1:04 PM

## 2022-02-26 ENCOUNTER — Inpatient Hospital Stay (HOSPITAL_COMMUNITY): Payer: BC Managed Care – PPO

## 2022-02-26 LAB — BASIC METABOLIC PANEL
Anion gap: 11 (ref 5–15)
BUN: 59 mg/dL — ABNORMAL HIGH (ref 6–20)
CO2: 20 mmol/L — ABNORMAL LOW (ref 22–32)
Calcium: 7.5 mg/dL — ABNORMAL LOW (ref 8.9–10.3)
Chloride: 114 mmol/L — ABNORMAL HIGH (ref 98–111)
Creatinine, Ser: 2.1 mg/dL — ABNORMAL HIGH (ref 0.61–1.24)
GFR, Estimated: 38 mL/min — ABNORMAL LOW (ref >60)
Glucose, Bld: 141 mg/dL — ABNORMAL HIGH (ref 70–99)
Potassium: 3.5 mmol/L (ref 3.5–5.1)
Sodium: 145 mmol/L (ref 135–145)

## 2022-02-26 LAB — CBC
HCT: 25.3 % — ABNORMAL LOW (ref 39.0–52.0)
Hemoglobin: 7.6 g/dL — ABNORMAL LOW (ref 13.0–17.0)
MCH: 24.3 pg — ABNORMAL LOW (ref 26.0–34.0)
MCHC: 30 g/dL (ref 30.0–36.0)
MCV: 80.8 fL (ref 80.0–100.0)
Platelets: 547 10*3/uL — ABNORMAL HIGH (ref 150–400)
RBC: 3.13 MIL/uL — ABNORMAL LOW (ref 4.22–5.81)
RDW: 19.6 % — ABNORMAL HIGH (ref 11.5–15.5)
WBC: 9 10*3/uL (ref 4.0–10.5)
nRBC: 0 % (ref 0.0–0.2)

## 2022-02-26 LAB — CULTURE, BLOOD (ROUTINE X 2)
Culture: NO GROWTH
Culture: NO GROWTH
Special Requests: ADEQUATE
Special Requests: ADEQUATE

## 2022-02-26 LAB — GLUCOSE, CAPILLARY
Glucose-Capillary: 129 mg/dL — ABNORMAL HIGH (ref 70–99)
Glucose-Capillary: 135 mg/dL — ABNORMAL HIGH (ref 70–99)
Glucose-Capillary: 137 mg/dL — ABNORMAL HIGH (ref 70–99)
Glucose-Capillary: 141 mg/dL — ABNORMAL HIGH (ref 70–99)
Glucose-Capillary: 145 mg/dL — ABNORMAL HIGH (ref 70–99)
Glucose-Capillary: 146 mg/dL — ABNORMAL HIGH (ref 70–99)
Glucose-Capillary: 165 mg/dL — ABNORMAL HIGH (ref 70–99)

## 2022-02-26 MED ORDER — TRACE MINERALS CU-MN-SE-ZN 300-55-60-3000 MCG/ML IV SOLN
INTRAVENOUS | Status: AC
  Filled 2022-02-26: qty 992.8

## 2022-02-26 MED ORDER — POTASSIUM CHLORIDE 10 MEQ/50ML IV SOLN
10.0000 meq | INTRAVENOUS | Status: AC
  Administered 2022-02-26 (×3): 10 meq via INTRAVENOUS
  Filled 2022-02-26 (×3): qty 50

## 2022-02-26 NOTE — Progress Notes (Signed)
Physical Therapy Treatment Patient Details Name: Patrick [name] MRN: [id number] DOB: [birth year] Today's Date: 02/26/2022   History of Present Illness Pt is a 52 y/o male admitted 1/24 with progressive anorexia, weight loss and abdominal mass.  1/24 pt s/p exp. Lap. With resection of transverse colon, drainage of mesenteric abscess, resection of proximal jejunum with small bowel anastomosis and diverting right colostomy.   Re-exploration with washout and new drain placement by Dr. Marlou Starks 2/2.  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    PT Comments    Pt is limited by fatigue during session, able to tolerate transfer to chair but demonstrating increased work of breathing and declining further mobility after. Pt continues to require physical assistance to ascend into standing, and demonstrates difficulty weight shifting to pivot this session. Pt will benefit from continued aggressive mobilization and PT services in an effort to improve endurance. PT updates recommendations to SNF placement due to regression of mobility.  Recommendations for follow up therapy are one component of a multi-disciplinary discharge planning process, led by the attending physician.  Recommendations may be updated based on patient status, additional functional criteria and insurance authorization.  Follow Up Recommendations  Skilled nursing-short term rehab (<3 hours/day) Can patient physically be transported by private vehicle: No   Assistance Recommended at Discharge Intermittent Supervision/Assistance  Patient can return home with the following A lot of help with walking and/or transfers;A lot of help with bathing/dressing/bathroom;Assistance with cooking/housework;Direct supervision/assist for medications management;Direct supervision/assist for financial management;Assist for transportation;Help with stairs or ramp for entrance   Equipment  Recommendations  Rolling walker (2 wheels);BSC/3in1    Recommendations for Other Services       Precautions / Restrictions Precautions Precautions: Fall Precaution Comments: multiple drains, watch O2, HR, and BP Restrictions Weight Bearing Restrictions: No     Mobility  Bed Mobility Overal bed mobility: Needs Assistance Bed Mobility: Sidelying to Sit, Rolling Rolling: Mod assist Sidelying to sit: Mod assist       General bed mobility comments: pt utilizing railing to push/pull    Transfers Overall transfer level: Needs assistance Equipment used: 1 person hand held assist Transfers: Sit to/from Stand, Bed to chair/wheelchair/BSC Sit to Stand: Mod assist   Step pivot transfers: Mod assist       General transfer comment: assistance for stability and to facilitate lateral stepping during step-pivot    Ambulation/Gait Ambulation/Gait assistance:  (deferred 2/2 pt fatigue)                 Stairs             Wheelchair Mobility    Modified Rankin (Stroke Patients Only)       Balance Overall balance assessment: Needs assistance Sitting-balance support: Feet supported, Single extremity supported Sitting balance-Leahy Scale: Poor     Standing balance support: Bilateral upper extremity supported Standing balance-Leahy Scale: Poor Standing balance comment: modA with BUE support of PT                            Cognition Arousal/Alertness: Awake/alert Behavior During Therapy: WFL for tasks assessed/performed Overall Cognitive Status: Impaired/Different from baseline Area of Impairment: Attention, Memory, Awareness, Safety/judgement, Problem solving                   Current Attention Level: Sustained Memory: Decreased short-term memory   Safety/Judgement: Decreased awareness of safety, Decreased awareness of  deficits Awareness: Emergent Problem Solving: Slow processing, Requires verbal cues          Exercises       General Comments General comments (skin integrity, edema, etc.): tachy into 120s observed, PT notes drainage from abdominal wound pre-mobility, RN assists in dressing change. BP in recliner at end of session 91/56.      Pertinent Vitals/Pain Pain Assessment Pain Assessment: Faces Faces Pain Scale: Hurts little more Pain Location: abdomen Pain Descriptors / Indicators: Grimacing Pain Intervention(s): Monitored during session    Home Living                          Prior Function            PT Goals (current goals can now be found in the care plan section) Acute Rehab PT Goals Patient Stated Goal: get home, back to work eventually PT Goal Formulation: With patient Time For Goal Achievement: 03/12/22 Potential to Achieve Goals: Fair Progress towards PT goals: Not progressing toward goals - comment (limited by fatigue)    Frequency    Min 3X/week      PT Plan Current plan remains appropriate    Co-evaluation              AM-PAC PT "6 Clicks" Mobility   Outcome Measure  Help needed turning from your back to your side while in a flat bed without using bedrails?: A Lot Help needed moving from lying on your back to sitting on the side of a flat bed without using bedrails?: A Lot Help needed moving to and from a bed to a chair (including a wheelchair)?: A Lot Help needed standing up from a chair using your arms (e.g., wheelchair or bedside chair)?: A Lot Help needed to walk in hospital room?: Total Help needed climbing 3-5 steps with a railing? : Total 6 Click Score: 10    End of Session Equipment Utilized During Treatment: Oxygen Activity Tolerance: Patient limited by fatigue Patient left: in chair;with call bell/phone within reach;with chair alarm set Nurse Communication: Mobility status PT Visit Diagnosis: Unsteadiness on feet (R26.81);Muscle weakness (generalized) (M62.81)     Time: [id number] PT Time Calculation (min) (ACUTE ONLY): 25  min  Charges:  $Therapeutic Activity: 8-22 mins                     Zenaida Niece, PT, DPT Acute Rehabilitation [place] Brittaney Beaulieu 02/26/2022, 1:53 PM

## 2022-02-26 NOTE — Progress Notes (Signed)
PHARMACY - TOTAL PARENTERAL NUTRITION CONSULT NOTE  Indication: Prolonged ileus  Patient Measurements: Height: 6' (182.9 cm) Weight: 105.9 kg (233 lb 7.5 oz) IBW/kg (Calculated) : 77.6 TPN AdjBW (KG): 86.6 Body mass index is 31.66 kg/m.  Assessment:  32 YOM presented with abdominal pain for 4 weeks, found to have retroperitoneal mass with necrotic jejunum, perforated fecal contamination and abscess. 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 Patient has been NPO for 4 days since admission. Patient/wife report he wasn't eating well for 4 weeks PTA and basically on a liquid diet. He endorses a ~10 lb weight loss during this time. Previously, he typically ate 2 meals a day (chicken, vegetables, beans and fruits) and 1 snack. Pharmacy consulted to dose TPN.  Glucose / Insulin: hx DM (A1C 6.6) on glipizide PTA. CBGs controlled <180 Received 23 units SSI in past 24 hrs + 55 units regular insulin in TPN bag Decadron IV x1 given preop 2/2 Electrolytes: K 3.5, mag 2.3, NA 145 (trending upward- monitor), Phos trending downward 3.1 others stable WNL Renal: AKI - SCr 2.10 (BL 1), BUN 59 Hepatic: LFTs WNL, Tbili remains elev at 6.0, TG 168 (propofol postop 2/2>>2/3), albumin <1.5 S/p thiamine x 3 days (1/26>1/28) Intake / Output; MIVF: UOP 1.2 ml/kg/hr, drain 1225 mL, colostomy 100 ml; net +7.79 L this admit GI Imaging: 1/23 CT abd pelvis - Marked severity colitis with adjacent abscess  1/29 CT abd pelvis - suspicious for abscesses and PNA, small pleural effusion, anasarca GI Surgeries / Procedures: 1/24 s/p ex lap with resection of transverse colon, drainage of mesenteric abscess, resection of proximal jejunum with SB anastomosis and diverting R colostomy and long Hartman's pouch 1/30 IR per drain placement 2/2 ex lap w/ I&D of abdominal abscess, 2  abdominal drains placed  Central access: PICC 02/12/22 TPN start date: 02/12/22  Nutritional Goals:  RD Estimated Needs Total Energy Estimated Needs: 2500-2800 Total Protein Estimated Needs: 140-155 grams Total Fluid Estimated Needs: >2 L/day   TPN goal rate: 85 mL/hr to provide 149 g AA and 2574 kCal/day  Current Nutrition:  TPN- concentrated starting 02/25/2022 PM  NPO  Plan:  Decrease TPN to a rate 85 ml/hr (concentrated formula) to provide 149 g AA, 408 g CHO, 59 g lipids and 2574 kCal, meeting 100% of needs Electrolytes in TPN: Na 45 mEq/L, K 45 meq/L, Ca 2.5 mEq/L, Mg 1.5 mEq/L, Phos 9 mmol/L; Cl:Ac maximized acetate  Add standard MVI to TPN T-bili 6. Jaundice improved but continue 1/2 trace elements until resolved, continue to hold chromium for now but consider adding back as renal function improves.  Continue insulin in TPN at 55 units Continue resistant SSI Q4H Potassium 10 meq runs x3  Monitor TPN labs daily until stable, then standard Mon/Thurs Monitor TG (SMOF lipids 25% of total kCal). Anticipate some degree of TG improvement with stopping propofol Bmet tomorrow with AM labs   [name] BS, PharmD, BCPS Clinical Pharmacist 02/26/2022 7:05 AM  Contact: [phone] after 3 PM  "Be curious, not judgmental..." -[name]

## 2022-02-26 NOTE — Progress Notes (Signed)
Interventional Radiology Progress/Imaging review Note  [facility] contacted regarding imaging review of CT 02/26/22  52 yo male S/P Exploratory laparotomy with resection of transverse colon, drainage of mesenteric abscess, resection of proximal jejunum with small bowel anastomosis and 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 by Dr. Brantley Stage.  Re-exploration with washout and new drain placement by Dr. Marlou Starks 2/2   Leukocytosis improving today.    CT reviewed, 3.5cm x 2.0cm fluid in the right paracolic gutter.  This was not present on previous CT.  This does not look like encapsulated abscess, more like accumulation of ascites/third spacing.  At this time too small for drain, and likely low yield for aspiration.   If patient has increasing white count or other signs of infection consider rescan, and [facility] available to review.   Call with concerns/questions.   Signed,  Dulcy Fanny. Earleen Newport, DO

## 2022-02-26 NOTE — Progress Notes (Signed)
Patient ID: Patrick [name], male   DOB: 01-Mar-1970, 52 y.o.   MRN: [id number] 7 Days Post-Op    Subjective: Still with some confusion, still trouble speaking secondary to dry mouth.  Not as much output from midline wound today, but changing dressing at least TID.   Still c/o feeling SOB.  21mg of levo today  Objective: Vital signs in last 24 hours: Temp:  [97.3 F (36.3 C)-101.7 F (38.7 C)] 101.7 F (38.7 C) (02/09 0400) Pulse Rate:  [102-135] 118 (02/09 0615) Resp:  [15-31] 16 (02/09 0615) BP: (86-147)/(53-99) 104/68 (02/09 0615) SpO2:  [87 %-99 %] 95 % (02/09 0615) Arterial Line BP: (48-97)/(30-55) 48/30 (02/08 1745) Weight:  [105.9 kg] 105.9 kg (02/09 0500) Last BM Date :  (no output in ostomy yet)  Intake/Output from previous day: 02/08 0701 - 02/09 0700 In: 2762.6 [I.V.:2487; IV Piggyback:275.6] Out: 4325 [Urine:3000; Drains:1225; Stool:100] Intake/Output this shift: No intake/output data recorded.  Gen: fatigued, WOB better than yesterday  Heart: regular, but tachy in 110-120s Lungs: CTAB.  But with some mild increase WOB.  Sats around 95% on 6L HFNC Abd: soft, wound with succus draining from mid portion of the wound.  Pseudomonas much better with Dakin's  The rest of the tissue is beefy red and retentions in place.  LUQ drain with bilious output.  1225cc/24hrs  JP 3 with a small amount of bilious drainage, 0cc in 24 hrs.  JP 2 with scant bloody drainage. Colostomy with scant effluent present GU: male purewick in place, clear yellow urine Ext: trace BLE edema Skin: some jaundice  Psych: somnolent, but will awaken and carry on a conversation.  Words are mumbled, but slightly improved after mouth care.  [facility]  Will say [facility] at times, but then [facility]  Oriented to date and president.  Some of his conversation is random and doesn't have anything to do with what we are discussing.  This is all about the same  today  Lab Results: CBC  Recent Labs    02/25/22 0418 02/25/22 0834 02/25/22 1542 02/26/22 0427  WBC 10.4  --   --  9.0  HGB 8.0*   < > 8.5* 7.6*  HCT 25.2*   < > 25.0* 25.3*  PLT 601*  --   --  547*   < > = values in this interval not displayed.   BMET Recent Labs    02/25/22 0418 02/25/22 0834 02/25/22 1542 02/26/22 0427  NA 144   < > 148* 145  K 3.8   < > 4.0 3.5  CL 109  --   --  114*  CO2 19*  --   --  20*  GLUCOSE 148*  --   --  141*  BUN 49*  --   --  59*  CREATININE 1.84*  --   --  2.10*  CALCIUM 7.4*  --   --  7.5*   < > = values in this interval not displayed.   PT/INR No results for input(s): "LABPROT", "INR" in the last 72 hours.  ABG Recent Labs    02/25/22 0834 02/25/22 1542  PHART 7.407 7.376  HCO3 20.4 20.3     Studies/Results: DG CHEST PORT 1 VIEW  Result Date: 02/25/2022 CLINICAL DATA:  Dyspnea in a 52year old male. EXAM: PORTABLE CHEST 1 VIEW COMPARISON:  February 23, 2022 FINDINGS: EKG leads project over the chest. Post median sternotomy for CABG. Signs of LEFT  atrial appendage clipping as well. Lung volumes remain low with persistent bibasilar airspace disease over the LEFT and RIGHT hemidiaphragm. No new areas of airspace disease. No pneumothorax. RIGHT-sided PICC line remains in place tip at the area of the caval to atrial junction. On limited assessment no acute skeletal process. IMPRESSION: 1. Low lung volumes with persistent bibasilar airspace disease. Electronically Signed   By: Zetta Bills M.D.   On: 02/25/2022 08:35    Anti-infectives: Anti-infectives (From admission, onward)    Start     Dose/Rate Route Frequency Ordered Stop   02/22/22 0800  vancomycin (VANCOREADY) IVPB 1250 mg/250 mL  Status:  Discontinued        1,250 mg 166.7 mL/hr over 90 Minutes Intravenous Every 24 hours 02/21/22 0233 02/23/22 0732   02/21/22 1030  micafungin (MYCAMINE) 150 mg in sodium chloride 0.9 % 100 mL IVPB  Status:  Discontinued        150  mg 107.5 mL/hr over 1 Hours Intravenous Every 24 hours 02/21/22 0934 02/24/22 0821   02/21/22 0315  vancomycin (VANCOREADY) IVPB 2000 mg/400 mL        2,000 mg 200 mL/hr over 120 Minutes Intravenous  Once 02/21/22 0229 02/21/22 0502   02/17/22 1200  piperacillin-tazobactam (ZOSYN) IVPB 3.375 g        3.375 g 12.5 mL/hr over 240 Minutes Intravenous Every 8 hours 02/17/22 1103     02/15/22 1500  piperacillin-tazobactam (ZOSYN) IVPB 3.375 g        3.375 g 12.5 mL/hr over 240 Minutes Intravenous Every 8 hours 02/15/22 0836 02/17/22 0217   01/24/2022 1100  piperacillin-tazobactam (ZOSYN) IVPB 3.375 g        3.375 g 100 mL/hr over 30 Minutes Intravenous  Once 02/08/2022 1056 02/07/2022 1230   02/04/2022 0200  piperacillin-tazobactam (ZOSYN) IVPB 3.375 g  Status:  Discontinued        3.375 g 12.5 mL/hr over 240 Minutes Intravenous Every 8 hours 01/28/2022 2322 02/15/22 0836   01/18/2022 1900  piperacillin-tazobactam (ZOSYN) IVPB 3.375 g        3.375 g 100 mL/hr over 30 Minutes Intravenous  Once 01/21/2022 1857 01/25/2022 2012       Assessment/Plan: 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, proximal small bowel with ischemic necrotic proximal jejunum with perforation fecal contamination with abscess  POD 16/7, S/P Exploratory laparotomy with resection of transverse colon, drainage of mesenteric abscess, resection of proximal jejunum with small bowel anastomosis and 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 by Dr. Brantley Stage.  Re-exploration with washout and new drain placement by Dr. Marlou Starks 2/2 - NPO due leak at small bowel anastomosis . May have a few ice chips, hard candy, and gum - cont NPO/TNA and treat this like a controlled fistula - 3 JP drains present.  LUQ at SB leak site.  Other 2 in places where abscesses were unroofed in OR.  Unfortunately now with succus draining from midline wound as well. -WBC 9K, T max 101.7 - TNA, concentrated.  DC extra IVFs - dressing changes to  midline wound, add Dakins due to pseudomonas on dressing.  Retentions in place.  Monitor output from midline wound.  If becomes extensive may need to place Eakin's pouch, but controlled for today  - WOC following for new ostomy - cont zosyn, Vanc added 2/4, but stopped 2/6 with no definite indications at this time.  DC micafungin as well.  No cultures with fungus present - surgical path with large B-cell lymphoma -culture  from LUQ drain pending, FEW ESCHERICHIA COLI  FEW ENTEROCOCCUS FAECALIS  FEW STREPTOCOCCUS ANGINOSIS  - resistant to amp, bactrim, cipro -PT/OT evals -CBC and BMET daily -CT CAP today to eval for undrained fluid collections given leakage present through wound and increased in gravity drain.   AKI - 2.1 today Fluid overload/hypoalbuminemia - making great improvements.  Monitor closely. Cont full rate TNA, but concentrate this.  Echo reviewed and EF preserved but with some hypokinesis.  80 total of lasix yesterday Hypoxia/LLL PNA(resolved) - pulm toilet. IS.  PNA resolved.  Increase WOB and hypoxia.  Add CT chest onto exam today to eval lungs. CAD, s/p CABG - no chest pain, echo as above DM - SSI.  Sugars fairly well controlled Hyperbilirubinemia - unclear etiology but suspect related to bowel leak and reabsorption across peritoneum.  Gallstones noted on CT, but no ductal dilatation.  Other LFTs essentially normal.  Continue to monitor.  Hep panel negative. Dowtrending.  Continue to monitor.  No other work up and intervention at this time. Chronic osteomyelitis of left foot - s/p amputation ABL anemia- 2 units prbc given 1/31, hgb 7.6 today, after 1 unit on 2/6 Fevers - PCXR, UA, blood cultures NGTD.  PCXR and UA so far negative.    FEN: NPO, TPN, may have some ice, gum, hard candy VTE: Lovenox ID: Zosyn 1/23>>  Vanc 2/4 -->2/6  given new fevers overnight and increasing pressors, new blood cultures, UA, CXR for work up.  Micafungin 2/4-->2/7  Dispo: ICU   The last 24 hours  of vitals, I/Os, RN notes, MD notes, labs, etc have been reviewed.    Henreitta Cea, PA-C 8:26 AM 02/26/2022  Please refer to amion for on call pager number between 7-4:15pm

## 2022-02-27 DIAGNOSIS — Z515 Encounter for palliative care: Secondary | ICD-10-CM | POA: Diagnosis not present

## 2022-02-27 LAB — BASIC METABOLIC PANEL
Anion gap: 14 (ref 5–15)
BUN: 73 mg/dL — ABNORMAL HIGH (ref 6–20)
CO2: 20 mmol/L — ABNORMAL LOW (ref 22–32)
Calcium: 8.1 mg/dL — ABNORMAL LOW (ref 8.9–10.3)
Chloride: 116 mmol/L — ABNORMAL HIGH (ref 98–111)
Creatinine, Ser: 2.51 mg/dL — ABNORMAL HIGH (ref 0.61–1.24)
GFR, Estimated: 30 mL/min — ABNORMAL LOW (ref >60)
Glucose, Bld: 123 mg/dL — ABNORMAL HIGH (ref 70–99)
Potassium: 4.5 mmol/L (ref 3.5–5.1)
Sodium: 150 mmol/L — ABNORMAL HIGH (ref 135–145)

## 2022-02-27 LAB — CBC
HCT: 25.6 % — ABNORMAL LOW (ref 39.0–52.0)
Hemoglobin: 7.7 g/dL — ABNORMAL LOW (ref 13.0–17.0)
MCH: 24.1 pg — ABNORMAL LOW (ref 26.0–34.0)
MCHC: 30.1 g/dL (ref 30.0–36.0)
MCV: 80 fL (ref 80.0–100.0)
Platelets: 621 10*3/uL — ABNORMAL HIGH (ref 150–400)
RBC: 3.2 MIL/uL — ABNORMAL LOW (ref 4.22–5.81)
RDW: 19.8 % — ABNORMAL HIGH (ref 11.5–15.5)
WBC: 9.1 10*3/uL (ref 4.0–10.5)
nRBC: 0 % (ref 0.0–0.2)

## 2022-02-27 LAB — GLUCOSE, CAPILLARY
Glucose-Capillary: 136 mg/dL — ABNORMAL HIGH (ref 70–99)
Glucose-Capillary: 128 mg/dL — ABNORMAL HIGH (ref 70–99)
Glucose-Capillary: 131 mg/dL — ABNORMAL HIGH (ref 70–99)
Glucose-Capillary: 148 mg/dL — ABNORMAL HIGH (ref 70–99)
Glucose-Capillary: 146 mg/dL — ABNORMAL HIGH (ref 70–99)
Glucose-Capillary: 154 mg/dL — ABNORMAL HIGH (ref 70–99)

## 2022-02-27 LAB — SODIUM, URINE, RANDOM: Sodium, Ur: 31 mmol/L

## 2022-02-27 MED ORDER — TRACE MINERALS CU-MN-SE-ZN 300-55-60-3000 MCG/ML IV SOLN
INTRAVENOUS | Status: AC
  Filled 2022-02-27: qty 992.8

## 2022-02-27 MED ORDER — ENOXAPARIN SODIUM 60 MG/0.6ML IJ SOSY
0.5000 mg/kg | PREFILLED_SYRINGE | INTRAMUSCULAR | Status: DC
  Administered 2022-02-27 – 2022-02-28 (×2): 52.5 mg via SUBCUTANEOUS
  Filled 2022-02-27 (×2): qty 0.6

## 2022-02-27 NOTE — Progress Notes (Signed)
Trauma/Critical Care Follow Up Note  Subjective:    Overnight Issues:   Objective:  Vital signs for last 24 hours: Temp:  [98.6 F (37 C)-100.9 F (38.3 C)] 100.9 F (38.3 C) (02/10 0400) Pulse Rate:  [106-133] 123 (02/10 0800) Resp:  [14-29] 18 (02/10 0800) BP: (89-129)/(50-90) 107/68 (02/10 0800) SpO2:  [87 %-99 %] 93 % (02/10 0800) Weight:  [104.2 kg-107.4 kg] 104.2 kg (02/10 0451)  Hemodynamic parameters for last 24 hours:    Intake/Output from previous day: 02/09 0701 - 02/10 0700 In: 2303.3 [I.V.:2010.7; IV Piggyback:292.6] Out: 2500 [Urine:1750; Drains:750]  Intake/Output this shift: Total I/O In: 173.3 [I.V.:148.4; IV Piggyback:24.9] Out: 100100 [Urine:100100]  Vent settings for last 24 hours:    Physical Exam:  Gen: comfortable, no distress Neuro: non-focal exam HEENT: PERRL Neck: supple CV: RRR Pulm: unlabored breathing on 2L [place] Abd: soft, NT GU: clear yellow urine Extr: wwp, no edema   Results for orders placed or performed during the hospital encounter of 02/16/2022 (from the past 24 hour(s))  Glucose, capillary     Status: Abnormal   Collection Time: 02/26/22  8:45 AM  Result Value Ref Range   Glucose-Capillary 137 (H) 70 - 99 mg/dL  Glucose, capillary     Status: Abnormal   Collection Time: 02/26/22 11:21 AM  Result Value Ref Range   Glucose-Capillary 129 (H) 70 - 99 mg/dL  Glucose, capillary     Status: Abnormal   Collection Time: 02/26/22  3:26 PM  Result Value Ref Range   Glucose-Capillary 141 (H) 70 - 99 mg/dL  Glucose, capillary     Status: Abnormal   Collection Time: 02/26/22  7:49 PM  Result Value Ref Range   Glucose-Capillary 146 (H) 70 - 99 mg/dL  Glucose, capillary     Status: Abnormal   Collection Time: 02/26/22 11:32 PM  Result Value Ref Range   Glucose-Capillary 135 (H) 70 - 99 mg/dL  Glucose, capillary     Status: Abnormal   Collection Time: 02/27/22  4:00 AM  Result Value Ref Range   Glucose-Capillary 136 (H) 70 - 99  mg/dL  Basic metabolic panel     Status: Abnormal   Collection Time: 02/27/22  6:51 AM  Result Value Ref Range   Sodium 150 (H) 135 - 145 mmol/L   Potassium 4.5 3.5 - 5.1 mmol/L   Chloride 116 (H) 98 - 111 mmol/L   CO2 20 (L) 22 - 32 mmol/L   Glucose, Bld 123 (H) 70 - 99 mg/dL   BUN 73 (H) 6 - 20 mg/dL   Creatinine, Ser 2.51 (H) 0.61 - 1.24 mg/dL   Calcium 8.1 (L) 8.9 - 10.3 mg/dL   GFR, Estimated 30 (L) >60 mL/min   Anion gap 14 5 - 15  CBC     Status: Abnormal   Collection Time: 02/27/22  6:51 AM  Result Value Ref Range   WBC 9.1 4.0 - 10.5 K/uL   RBC 3.20 (L) 4.22 - 5.81 MIL/uL   Hemoglobin 7.7 (L) 13.0 - 17.0 g/dL   HCT 25.6 (L) 39.0 - 52.0 %   MCV 80.0 80.0 - 100.0 fL   MCH 24.1 (L) 26.0 - 34.0 pg   MCHC 30.1 30.0 - 36.0 g/dL   RDW 19.8 (H) 11.5 - 15.5 %   Platelets 621 (H) 150 - 400 K/uL   nRBC 0.0 0.0 - 0.2 %  Glucose, capillary     Status: Abnormal   Collection Time: 02/27/22  7:43 AM  Result Value Ref Range   Glucose-Capillary 128 (H) 70 - 99 mg/dL    Assessment & Plan: The plan of care was discussed with the bedside nurse for the day, who is in agreement with this plan and no additional concerns were raised.   Present on Admission:  Abdominal mass    LOS: 18 days   Additional comments:I reviewed the patient's new clinical lab test results.   and I reviewed the patients new imaging test results.    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 Brantley Stage.  Re-exploration with washout and new drain placement by Dr. Marlou Starks 2/2 - NPO due leak at small bowel anastomosis . May have a few ice chips, hard candy, and gum -  cont NPO/TNA and treat this like a controlled fistula - 3 JP drains present.  LUQ at SB leak site.  Other 2 in places where abscesses were unroofed in OR.  Unfortunately now with succus draining from midline wound as well. -WBC 9K, T max 101.7 - TNA, concentrated.  DC extra IVFs - dressing changes to midline wound, add Dakins due to pseudomonas on dressing.  Retentions in place.  Monitor output from midline wound.  If becomes extensive may need to place Eakin's pouch, but controlled for today  - WOC following for new ostomy - cont zosyn - surgical path with large B-cell lymphoma -culture from LUQ drain pending, FEW ESCHERICHIA COLI  FEW ENTEROCOCCUS FAECALIS  FEW STREPTOCOCCUS ANGINOSIS  - resistant to amp, bactrim, cipro -PT/OT evals -CBC and BMET daily -CT CAP 2/10   AKI - 2.5 today Fluid overload/hypoalbuminemia - making great improvements.  Monitor closely. Cont full rate TNA, but concentrate this.  Echo reviewed and EF preserved but with some hypokinesis.  Hypoxia/LLL PNA(resolved) - pulm toilet. IS.  PNA resolved.  WOB and hypoxia improved.   CAD, s/p CABG - no chest pain, echo as above DM - SSI.  Sugars fairly well controlled Hyperbilirubinemia - unclear etiology but suspect related to bowel leak and reabsorption across peritoneum.  Gallstones noted on CT, but no ductal dilatation.  Other LFTs essentially normal.  Continue to monitor.  Hep panel negative. Continue to monitor.  No other work up and intervention at this time. Chronic osteomyelitis of left foot - s/p amputation ABL anemia- 2 units prbc given 1/31, hgb 7.7 today, after 1 unit on 2/6 Fevers - PCXR, UA, blood cultures NGTD.  PCXR and UA so far negative.     FEN: NPO, TPN, may have some ice, gum, hard candy VTE: Lovenox ID: Zosyn 1/23>>  Vanc 2/4 -->2/6  given new fevers overnight and increasing pressors, new blood cultures, UA, CXR for work up.  Micafungin 2/4-->2/7. WBC normalized, CT 2/9 with fluid collection not  amenable to drainage, plan repeat CT if clinical decline or increasing WBC.    Dispo: ICU    Jesusita Oka, MD Trauma & General Surgery Please use [email or web address] to contact on call provider  02/27/2022  *Care during the described time interval was provided by me. I have reviewed this patient's available data, including medical history, events of note, physical examination and test results as part of my evaluation.

## 2022-02-27 NOTE — Progress Notes (Signed)
   Palliative Medicine Inpatient Follow Up Note  HPI: 52 y.o. male  with past medical history of T2DM, s/p left transmetatarsal amputation secondary to osteomyelitis and CAD s/p CABG. Admitted on 02/07/2022 with severe abdominal pain. Initially presented to PCP who sent for CT with findings concerning for pneumatosis and possible pneumoperitoneum and directed to come to ED.   1/24: Underwent exploratory laparotomy with resection of transverse colon, drainage of mesenteric abscess, resection of proximal jejunum with small bowel anastomosis and 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. Findings of retroperitoneal mass with invasion into transverse colon, proximal small bowel with ischemic necrotic proximal jejunum with perforation fecal contamination with abscess.   2/2: Underwent exploratory laparotomy with abdominal irrigation and debridement, drainage intraabdominal abscess   JP drains remain in place due to leak Remains on levophed NPO with TPN Acute delirium slowly improving   Palliative medicine was consulted for assisting with goals of care conversations.  Today's Discussion 02/27/2022  *Please note that this is a verbal dictation therefore any spelling or grammatical errors are due to the "[place] [employer]" system interpretation.  Chart reviewed inclusive of vital signs, progress notes, laboratory results, and diagnostic images.   I spoke to patients RN, Beverlee Nims who had no concerns this morning.  Patient breathing comfortably on 2LPM [place]. He endorses improvement in his breathing overall. Patient glad he is receiving ice chips at the time of assessment.   Patient is present with his spouse who shares that he does appear to be in better shape today.   Patient and his spouse remain hopeful for ongoing improvements,. They are aware that these will be gradual.   Supported provided through therapeutic listening.  Questions and concerns addressed.  Objective  Assessment: Vital Signs Vitals:   02/27/22 1230 02/27/22 1300  BP: 93/60 99/77  Pulse: (!) 118 (!) 128  Resp: 18 17  Temp:    SpO2: 94% 94%    Intake/Output Summary (Last 24 hours) at 02/27/2022 1349 Last data filed at 02/27/2022 1000 Gross per 24 hour  Intake 1978.58 ml  Output 102100 ml  Net -100121.42 ml    Last Weight  Most recent update: 02/27/2022  4:52 AM    Weight  104.2 kg (229 lb 11.5 oz)            Gen: Middle age 43 M in NAD HEENT: sclera jaundiced, dry mucous membranes CV: Irregular rate and rhythm PULM: On 2 LPM  EXT: (+) anasarca Neuro: Alert and oriented  x3  SUMMARY OF RECOMMENDATIONS   Full Code-continue full scope of treatment   Institute delirium precautions  Wet mouth PRN for comfort  Support provided  PMT will continue to follow  Total Time: 35 ______________________________________________________________________________________ [place] Team Team Cell Phone: [phone] Please utilize secure chat with additional questions, if there is no response within 30 minutes please call the above phone number  Palliative Medicine Team providers are available by phone from 7am to 7pm daily and can be reached through the team cell phone.  Should this patient require assistance outside of these hours, please call the patient's attending physician.

## 2022-02-27 NOTE — Progress Notes (Addendum)
PHARMACY - TOTAL PARENTERAL NUTRITION CONSULT NOTE  Indication: Prolonged ileus  Patient Measurements: Height: 6' (182.9 cm) Weight: 104.2 kg (229 lb 11.5 oz) IBW/kg (Calculated) : 77.6 TPN AdjBW (KG): 86.6 Body mass index is 31.16 kg/m.  Assessment:  76 YOM presented with abdominal pain for 4 weeks, found to have retroperitoneal mass with necrotic jejunum, perforated fecal contamination and abscess. 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 Patient has been NPO for 4 days since admission. Patient/wife report he wasn't eating well for 4 weeks PTA and basically on a liquid diet. He endorses a ~10 lb weight loss during this time. Previously, he typically ate 2 meals a day (chicken, vegetables, beans and fruits) and 1 snack. Pharmacy consulted to dose TPN.  Glucose / Insulin: hx DM (A1C 6.6) on glipizide PTA. CBGs controlled <180 Received 18 units SSI in past 24 hrs + 55 units regular insulin in TPN bag Decadron IV x1 given preop 2/2 Electrolytes: Na 150, Cl 116, K 4.5, mag 2.3 (2/8), Phos 3.1 (2/8), Ca 8.1 (CoCa 10.18) Renal: AKI - SCr 2.51 (BL 1), BUN 73 Hepatic: LFTs WNL, Tbili remains elev at 6.0, TG 168 (propofol postop 2/2>>2/3), albumin <1.5 S/p thiamine x 3 days (1/26>1/28) Intake / Output; MIVF: UOP 1.2 ml/kg/hr, drain 1225 mL, colostomy 100 ml; net +7.79 L this admit GI Imaging: 1/23 CT abd pelvis - Marked severity colitis with adjacent abscess  1/29 CT abd pelvis - suspicious for abscesses and PNA, small pleural effusion, anasarca  2/9- CT abd pelvis w/o contrast- New 2 x 3.5 cm RIGHT LATERAL abdomen/paracolic gutter collection/abscess and interval decrease in size of a 2.5 x 5 cm low pelvic collection/abscess, previously measured 4 x 6 cm. Interval decrease in size of the anterior LEFT abdominal collection/abscess containing drain,  now measuring 5.9 x 8.6 cm, previously 12.4 x 13.9 cm.  GI Surgeries / Procedures: 1/24 s/p ex lap with resection of transverse colon, drainage of mesenteric abscess, resection of proximal jejunum with SB anastomosis and diverting R colostomy and long Hartman's pouch 1/30 IR per drain placement 2/2 ex lap w/ I&D of abdominal abscess, 2 abdominal drains placed  Central access: PICC 02/12/22 TPN start date: 02/12/22  Nutritional Goals:  RD Estimated Needs Total Energy Estimated Needs: 2500-2800 Total Protein Estimated Needs: 140-155 grams Total Fluid Estimated Needs: >2 L/day   TPN goal rate: 85 mL/hr to provide 149 g AA and 2574 kCal/day  Current Nutrition:  TPN- concentrated starting 02/25/2022 PM  NPO  Plan:  Continue TPN at rate 85 ml/hr (concentrated formula) to provide 149 g AA, 408 g CHO, 59 g lipids and 2574 kCal, meeting 100% of needs Electrolytes in TPN: Na 35 mEq/L, K 43 meq/L, Ca 2 mEq/L, Mg 1.5 mEq/L, Phos 9 mmol/L; Cl:Ac maximized acetate  Add standard MVI to TPN T-bili 6. Jaundice improved but continue 1/2 trace elements until resolved, continue to hold chromium for now but consider adding back as renal function improves.  Continue insulin in TPN at 55 units Continue resistant SSI Q4H Monitor TPN labs daily until stable, then standard Mon/Thurs Monitor TG (SMOF lipids 25% of total kCal). Anticipate some degree of TG improvement with stopping propofol Bmet / Phos tomorrow with AM labs   [name] BS, PharmD, BCPS Clinical Pharmacist 02/27/2022 8:17 AM  Contact: [phone] after 3 PM  "Be curious, not judgmental..." -[name]

## 2022-02-28 DIAGNOSIS — Z515 Encounter for palliative care: Secondary | ICD-10-CM | POA: Diagnosis not present

## 2022-02-28 LAB — CBC
HCT: 23.2 % — ABNORMAL LOW (ref 39.0–52.0)
Hemoglobin: 7.1 g/dL — ABNORMAL LOW (ref 13.0–17.0)
MCH: 24.5 pg — ABNORMAL LOW (ref 26.0–34.0)
MCHC: 30.6 g/dL (ref 30.0–36.0)
MCV: 80 fL (ref 80.0–100.0)
Platelets: 602 10*3/uL — ABNORMAL HIGH (ref 150–400)
RBC: 2.9 MIL/uL — ABNORMAL LOW (ref 4.22–5.81)
RDW: 19.9 % — ABNORMAL HIGH (ref 11.5–15.5)
WBC: 7.9 10*3/uL (ref 4.0–10.5)
nRBC: 0 % (ref 0.0–0.2)

## 2022-02-28 LAB — CULTURE, BLOOD (ROUTINE X 2)
Culture: NO GROWTH
Culture: NO GROWTH

## 2022-02-28 LAB — UREA NITROGEN, URINE: Urea Nitrogen, Ur: 868 mg/dL

## 2022-02-28 LAB — BASIC METABOLIC PANEL
Anion gap: 12 (ref 5–15)
BUN: 82 mg/dL — ABNORMAL HIGH (ref 6–20)
CO2: 21 mmol/L — ABNORMAL LOW (ref 22–32)
Calcium: 7.8 mg/dL — ABNORMAL LOW (ref 8.9–10.3)
Chloride: 119 mmol/L — ABNORMAL HIGH (ref 98–111)
Creatinine, Ser: 2.67 mg/dL — ABNORMAL HIGH (ref 0.61–1.24)
GFR, Estimated: 28 mL/min — ABNORMAL LOW (ref >60)
Glucose, Bld: 149 mg/dL — ABNORMAL HIGH (ref 70–99)
Potassium: 4.2 mmol/L (ref 3.5–5.1)
Sodium: 152 mmol/L — ABNORMAL HIGH (ref 135–145)

## 2022-02-28 LAB — PHOSPHORUS: Phosphorus: 3.9 mg/dL (ref 2.5–4.6)

## 2022-02-28 LAB — GLUCOSE, CAPILLARY
Glucose-Capillary: 144 mg/dL — ABNORMAL HIGH (ref 70–99)
Glucose-Capillary: 149 mg/dL — ABNORMAL HIGH (ref 70–99)
Glucose-Capillary: 154 mg/dL — ABNORMAL HIGH (ref 70–99)
Glucose-Capillary: 159 mg/dL — ABNORMAL HIGH (ref 70–99)
Glucose-Capillary: 161 mg/dL — ABNORMAL HIGH (ref 70–99)
Glucose-Capillary: 211 mg/dL — ABNORMAL HIGH (ref 70–99)

## 2022-02-28 MED ORDER — TRACE MINERALS CU-MN-SE-ZN 300-55-60-3000 MCG/ML IV SOLN
INTRAVENOUS | Status: AC
  Filled 2022-02-28: qty 992.8

## 2022-02-28 MED ORDER — LACTATED RINGERS IV BOLUS
500.0000 mL | Freq: Once | INTRAVENOUS | Status: AC
  Administered 2022-02-28: 500 mL via INTRAVENOUS

## 2022-02-28 NOTE — Progress Notes (Signed)
PHARMACY - TOTAL PARENTERAL NUTRITION CONSULT NOTE  Indication: Prolonged ileus  Patient Measurements: Height: 6' (182.9 cm) Weight: 104 kg (229 lb 4.5 oz) IBW/kg (Calculated) : 77.6 TPN AdjBW (KG): 86.6 Body mass index is 31.1 kg/m.  Assessment:  10 YOM presented with abdominal pain for 4 weeks, found to have retroperitoneal mass with necrotic jejunum, perforated fecal contamination and abscess. 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 Patient has been NPO for 4 days since admission. Patient/wife report he wasn't eating well for 4 weeks PTA and basically on a liquid diet. He endorses a ~10 lb weight loss during this time. Previously, he typically ate 2 meals a day (chicken, vegetables, beans and fruits) and 1 snack. Pharmacy consulted to dose TPN.  Glucose / Insulin: hx DM (A1C 6.6) on glipizide PTA. CBGs controlled <180 Received 19 units SSI in past 24 hrs + 55 units regular insulin in TPN bag Decadron IV x1 given preop 2/2 Electrolytes: Na 152, Cl 119, K 4.2, mag 2.3 (2/8), Phos 3.9, Ca 7.8 (CoCa 9.88) Renal: AKI - SCr 2.51 (BL 1), BUN 73 Hepatic: LFTs WNL, Tbili remains elev at 6.0, TG 168 (propofol postop 2/2>>2/3), albumin <1.5 S/p thiamine x 3 days (1/26>1/28) Intake / Output; MIVF: UOP 0.82 ml/kg/hr, drain 725 mL, colostomy 0 ml; net +7.79 L this admit GI Imaging: 1/23 CT abd pelvis - Marked severity colitis with adjacent abscess  1/29 CT abd pelvis - suspicious for abscesses and PNA, small pleural effusion, anasarca  2/9- CT abd pelvis w/o contrast- New 2 x 3.5 cm RIGHT LATERAL abdomen/paracolic gutter collection/abscess and interval decrease in size of a 2.5 x 5 cm low pelvic collection/abscess, previously measured 4 x 6 cm. Interval decrease in size of the anterior LEFT abdominal collection/abscess containing drain, now measuring  5.9 x 8.6 cm, previously 12.4 x 13.9 cm.  GI Surgeries / Procedures: 1/24 s/p ex lap with resection of transverse colon, drainage of mesenteric abscess, resection of proximal jejunum with SB anastomosis and diverting R colostomy and long Hartman's pouch 1/30 IR per drain placement 2/2 ex lap w/ I&D of abdominal abscess, 2 abdominal drains placed  Central access: PICC 02/12/22 TPN start date: 02/12/22  Nutritional Goals:  RD Estimated Needs Total Energy Estimated Needs: 2500-2800 Total Protein Estimated Needs: 140-155 grams Total Fluid Estimated Needs: >2 L/day   TPN goal rate: 85 mL/hr to provide 149 g AA and 2574 kCal/day  Current Nutrition:  TPN- concentrated starting 02/25/2022 PM  NPO  Plan:  Continue TPN at rate 85 ml/hr (concentrated formula) to provide 149 g AA, 408 g CHO, 59 g lipids and 2574 kCal, meeting 100% of needs Electrolytes in TPN: Na 0 mEq/L, K 43 meq/L, Ca 2 mEq/L, Mg 1.5 mEq/L, Phos 9 mmol/L; Cl:Ac maximized acetate  Add standard MVI to TPN T-bili 6. Jaundice improved but continue 1/2 trace elements until resolved, continue to hold chromium for now but consider adding back as renal function improves.  Continue insulin in TPN at 55 units Continue resistant SSI Q4H Monitor TPN labs daily until stable, then standard Mon/Thurs Monitor TG (SMOF lipids 25% of total kCal). Anticipate some degree of TG improvement with stopping propofol   Patrick [name] BS, PharmD, BCPS Clinical Pharmacist 02/28/2022 6:53 AM  Contact: [phone] after 3 PM  "Be curious, not judgmental..." -Patrick [name]

## 2022-02-28 NOTE — Progress Notes (Signed)
   Palliative Medicine Inpatient Follow Up Note  HPI: 52 y.o. male  with past medical history of T2DM, s/p left transmetatarsal amputation secondary to osteomyelitis and CAD s/p CABG. Admitted on 02/04/2022 with severe abdominal pain. Initially presented to PCP who sent for CT with findings concerning for pneumatosis and possible pneumoperitoneum and directed to come to ED.   1/24: Underwent exploratory laparotomy with resection of transverse colon, drainage of mesenteric abscess, resection of proximal jejunum with small bowel anastomosis and 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. Findings of retroperitoneal mass with invasion into transverse colon, proximal small bowel with ischemic necrotic proximal jejunum with perforation fecal contamination with abscess.   2/2: Underwent exploratory laparotomy with abdominal irrigation and debridement, drainage intraabdominal abscess   JP drains remain in place due to leak Remains on levophed NPO with TPN Acute delirium slowly improving   Palliative medicine was consulted for assisting with goals of care conversations.  Today's Discussion 02/28/2022  *Please note that this is a verbal dictation therefore any spelling or grammatical errors are due to the "[place] [employer]" system interpretation.  Chart reviewed inclusive of vital signs, progress notes, laboratory results, and diagnostic images.   I spoke to patients RN, Beverlee Nims who shares patient is having a more difficult time breathing this morning.  Upon assessment, Patrick [name] is noted to be more tachypneic and disoriented. He remains clear on his desire for continue care.   NO family present at bedside.   Palliative care will continue to offer support.   Objective Assessment: Vital Signs Vitals:   02/28/22 1200 02/28/22 1230  BP: 104/65 97/60  Pulse: (!) 114 (!) 113  Resp: (!) 27 (!) 24  Temp: (!) 100.7 F (38.2 C)   SpO2: 93% 97%    Intake/Output Summary (Last 24 hours) at  02/28/2022 1329 Last data filed at 02/28/2022 1200 Gross per 24 hour  Intake 2578.06 ml  Output 2300 ml  Net 278.06 ml    Last Weight  Most recent update: 02/28/2022  5:07 AM    Weight  104 kg (229 lb 4.5 oz)            Gen: Middle age 42 M in NAD HEENT: sclera jaundiced, dry mucous membranes CV: Irregular rate and rhythm PULM: On 4LPM [place], (+) tachypnea EXT: (+) anasarca Neuro: Disoriented at times  SUMMARY OF RECOMMENDATIONS   Full Code-continue full scope of treatment   Institute delirium precautions  Wet mouth PRN for comfort  Support provided  PMT will continue to follow incrementally  Total Time: 25 ______________________________________________________________________________________ [place] Team Team Cell Phone: [phone] Please utilize secure chat with additional questions, if there is no response within 30 minutes please call the above phone number  Palliative Medicine Team providers are available by phone from 7am to 7pm daily and can be reached through the team cell phone.  Should this patient require assistance outside of these hours, please call the patient's attending physician.

## 2022-02-28 NOTE — Progress Notes (Signed)
Pt had an episode of increased delirium in which this RN notified MD. MD aware and told to continue to monitor as long as labs were not abnormal.

## 2022-02-28 NOTE — Progress Notes (Signed)
General Surgery Follow Up Note  Subjective:    Overnight Issues:   Objective:  Vital signs for last 24 hours: Temp:  [98.9 F (37.2 C)-100.6 F (38.1 C)] 100.6 F (38.1 C) (02/11 0400) Pulse Rate:  [105-135] 127 (02/11 0800) Resp:  [16-30] 24 (02/11 0800) BP: (90-121)/(54-77) 97/61 (02/11 0800) SpO2:  [85 %-97 %] 96 % (02/11 0800) Weight:  [104 kg] 104 kg (02/11 0500)  Hemodynamic parameters for last 24 hours:    Intake/Output from previous day: 02/10 0701 - 02/11 0700 In: 2153.5 [I.V.:1995.4; IV Piggyback:158.1] Out: 2775 [Urine:2050; Drains:725]  Intake/Output this shift: Total I/O In: 195 [I.V.:170; IV Piggyback:25] Out: -   Vent settings for last 24 hours:    Physical Exam:  Gen: comfortable, no distress Neuro: intermittently delirious HEENT: PERRL Neck: supple CV: RRR Pulm: unlabored breathing on 2L [place] Abd: soft, drains x3, midline with enteric drainage GU: clear yellow urine Extr: wwp, no edema   Results for orders placed or performed during the hospital encounter of 01/24/2022 (from the past 24 hour(s))  Sodium, urine, random     Status: None   Collection Time: 02/27/22  9:57 AM  Result Value Ref Range   Sodium, Ur 31 mmol/L  Glucose, capillary     Status: Abnormal   Collection Time: 02/27/22 11:19 AM  Result Value Ref Range   Glucose-Capillary 131 (H) 70 - 99 mg/dL  Glucose, capillary     Status: Abnormal   Collection Time: 02/27/22  3:17 PM  Result Value Ref Range   Glucose-Capillary 148 (H) 70 - 99 mg/dL  Glucose, capillary     Status: Abnormal   Collection Time: 02/27/22  7:27 PM  Result Value Ref Range   Glucose-Capillary 146 (H) 70 - 99 mg/dL  Glucose, capillary     Status: Abnormal   Collection Time: 02/27/22 11:06 PM  Result Value Ref Range   Glucose-Capillary 154 (H) 70 - 99 mg/dL  Glucose, capillary     Status: Abnormal   Collection Time: 02/28/22  3:11 AM  Result Value Ref Range   Glucose-Capillary 144 (H) 70 - 99 mg/dL  CBC      Status: Abnormal   Collection Time: 02/28/22  5:00 AM  Result Value Ref Range   WBC 7.9 4.0 - 10.5 K/uL   RBC 2.90 (L) 4.22 - 5.81 MIL/uL   Hemoglobin 7.1 (L) 13.0 - 17.0 g/dL   HCT 23.2 (L) 39.0 - 52.0 %   MCV 80.0 80.0 - 100.0 fL   MCH 24.5 (L) 26.0 - 34.0 pg   MCHC 30.6 30.0 - 36.0 g/dL   RDW 19.9 (H) 11.5 - 15.5 %   Platelets 602 (H) 150 - 400 K/uL   nRBC 0.0 0.0 - 0.2 %  Basic metabolic panel     Status: Abnormal   Collection Time: 02/28/22  5:00 AM  Result Value Ref Range   Sodium 152 (H) 135 - 145 mmol/L   Potassium 4.2 3.5 - 5.1 mmol/L   Chloride 119 (H) 98 - 111 mmol/L   CO2 21 (L) 22 - 32 mmol/L   Glucose, Bld 149 (H) 70 - 99 mg/dL   BUN 82 (H) 6 - 20 mg/dL   Creatinine, Ser 2.67 (H) 0.61 - 1.24 mg/dL   Calcium 7.8 (L) 8.9 - 10.3 mg/dL   GFR, Estimated 28 (L) >60 mL/min   Anion gap 12 5 - 15  Phosphorus     Status: None   Collection Time: 02/28/22  5:00 AM  Result Value Ref Range   Phosphorus 3.9 2.5 - 4.6 mg/dL  Glucose, capillary     Status: Abnormal   Collection Time: 02/28/22  8:15 AM  Result Value Ref Range   Glucose-Capillary 149 (H) 70 - 99 mg/dL    Assessment & Plan: The plan of care was discussed with the bedside nurse for the day, who is in agreement with this plan and no additional concerns were raised.   Present on Admission:  Abdominal mass    LOS: 19 days   Additional comments:I reviewed the patient's new clinical lab test results.   and I reviewed the patients new imaging test results.    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 Brantley Stage.  Re-exploration with washout and new drain placement  by Dr. Marlou Starks 2/2 - NPO due leak at small bowel anastomosis . May have a few ice chips, hard candy, and gum - cont NPO/TNA and treat this like a controlled fistula - 3 JP drains present.  LUQ at SB leak site.  Other 2 in places where abscesses were unroofed in OR.  Unfortunately now with succus draining from midline wound as well. -WBC 9K, T max 100.6 - TNA, concentrated.  - dressing changes to midline wound, add Dakins due to pseudomonas on dressing.  Retentions in place.  Monitor output from midline wound.  If becomes extensive may need to place Eakin's pouch, but controlled for today  - WOC following for new ostomy - cont zosyn - surgical path with large B-cell lymphoma -culture from LUQ drain pending, FEW ESCHERICHIA COLI  FEW ENTEROCOCCUS FAECALIS  FEW STREPTOCOCCUS ANGINOSIS  - resistant to amp, bactrim, cipro -PT/OT evals -CBC and BMET daily -CT CAP 2/10   AKI - 2.6 today Fluid overload/hypoalbuminemia - making great improvements.  Monitor closely. Cont full rate TNA, but concentrate this.  Echo reviewed and EF preserved but with some hypokinesis.  Hypoxia/LLL PNA(resolved) - pulm toilet. IS.  PNA resolved.  WOB and hypoxia improved.   CAD, s/p CABG - no chest pain, echo as above DM - SSI.  Sugars fairly well controlled Hyperbilirubinemia - unclear etiology but suspect related to bowel leak and reabsorption across peritoneum.  Gallstones noted on CT, but no ductal dilatation.  Other LFTs essentially normal.  Continue to monitor.  Hep panel negative. Continue to monitor.  No other work up and intervention at this time. Chronic osteomyelitis of left foot - s/p amputation ABL anemia- 2 units prbc given 1/31, hgb 7.7 today, after 1 unit on 2/6 Fevers - PCXR, UA, blood cultures NGTD.  PCXR and UA so far negative.   Shock - intermittently on low dose levo, likely a component of septic shock, but may also have some hypovolemia due to fluid losses, will give 500cc bolus. UUN is sendout, so  cannot calculate FeUrea.   FEN: NPO, TPN, may have some ice, gum, hard candy VTE: Lovenox ID: Zosyn 1/23>>  Vanc 2/4 -->2/6  given new fevers overnight and increasing pressors, new blood cultures, UA, CXR for work up.  Micafungin 2/4-->2/7. WBC normalized, CT 2/9 with fluid collection not amenable to drainage, plan repeat CT if clinical decline or increasing WBC.    Dispo: ICU   Critical  Care Total Time: 35 minutes  Jesusita Oka, MD Trauma & General Surgery Please use [email or web address] to contact on call provider  02/28/2022  *Care during the described time interval was provided by me. I have reviewed this patient's available data, including medical history, events of note, physical examination and test results as part of my evaluation.

## 2022-03-01 ENCOUNTER — Inpatient Hospital Stay (HOSPITAL_COMMUNITY): Payer: BC Managed Care – PPO

## 2022-03-01 DIAGNOSIS — A419 Sepsis, unspecified organism: Secondary | ICD-10-CM

## 2022-03-01 DIAGNOSIS — R1900 Intra-abdominal and pelvic swelling, mass and lump, unspecified site: Secondary | ICD-10-CM | POA: Diagnosis not present

## 2022-03-01 LAB — POCT I-STAT 7, (LYTES, BLD GAS, ICA,H+H)
Acid-base deficit: 3 mmol/L — ABNORMAL HIGH (ref 0.0–2.0)
Bicarbonate: 20.6 mmol/L (ref 20.0–28.0)
Calcium, Ion: 1.17 mmol/L (ref 1.15–1.40)
HCT: 21 % — ABNORMAL LOW (ref 39.0–52.0)
Hemoglobin: 7.1 g/dL — ABNORMAL LOW (ref 13.0–17.0)
O2 Saturation: 90 %
Potassium: 4.2 mmol/L (ref 3.5–5.1)
Sodium: 158 mmol/L — ABNORMAL HIGH (ref 135–145)
TCO2: 22 mmol/L (ref 22–32)
pCO2 arterial: 30.9 mmHg — ABNORMAL LOW (ref 32–48)
pH, Arterial: 7.432 (ref 7.35–7.45)
pO2, Arterial: 56 mmHg — ABNORMAL LOW (ref 83–108)

## 2022-03-01 LAB — CBC
HCT: 24.5 % — ABNORMAL LOW (ref 39.0–52.0)
Hemoglobin: 7.5 g/dL — ABNORMAL LOW (ref 13.0–17.0)
MCH: 24.7 pg — ABNORMAL LOW (ref 26.0–34.0)
MCHC: 30.6 g/dL (ref 30.0–36.0)
MCV: 80.6 fL (ref 80.0–100.0)
Platelets: 597 10*3/uL — ABNORMAL HIGH (ref 150–400)
RBC: 3.04 MIL/uL — ABNORMAL LOW (ref 4.22–5.81)
RDW: 20.4 % — ABNORMAL HIGH (ref 11.5–15.5)
WBC: 6.6 10*3/uL (ref 4.0–10.5)
nRBC: 0 % (ref 0.0–0.2)

## 2022-03-01 LAB — GLUCOSE, CAPILLARY
Glucose-Capillary: 152 mg/dL — ABNORMAL HIGH (ref 70–99)
Glucose-Capillary: 153 mg/dL — ABNORMAL HIGH (ref 70–99)
Glucose-Capillary: 153 mg/dL — ABNORMAL HIGH (ref 70–99)
Glucose-Capillary: 161 mg/dL — ABNORMAL HIGH (ref 70–99)
Glucose-Capillary: 175 mg/dL — ABNORMAL HIGH (ref 70–99)
Glucose-Capillary: 218 mg/dL — ABNORMAL HIGH (ref 70–99)

## 2022-03-01 LAB — TRIGLYCERIDES: Triglycerides: 206 mg/dL — ABNORMAL HIGH (ref <150)

## 2022-03-01 LAB — LACTIC ACID, PLASMA: Lactic Acid, Venous: 1.6 mmol/L (ref 0.5–1.9)

## 2022-03-01 LAB — COMPREHENSIVE METABOLIC PANEL
ALT: 46 U/L — ABNORMAL HIGH (ref 0–44)
AST: 60 U/L — ABNORMAL HIGH (ref 15–41)
Albumin: <1.5 g/dL — ABNORMAL LOW (ref 3.5–5.0)
Alkaline Phosphatase: 162 U/L — ABNORMAL HIGH (ref 38–126)
Anion gap: 13 (ref 5–15)
BUN: 99 mg/dL — ABNORMAL HIGH (ref 6–20)
CO2: 21 mmol/L — ABNORMAL LOW (ref 22–32)
Calcium: 8 mg/dL — ABNORMAL LOW (ref 8.9–10.3)
Chloride: 119 mmol/L — ABNORMAL HIGH (ref 98–111)
Creatinine, Ser: 3 mg/dL — ABNORMAL HIGH (ref 0.61–1.24)
GFR, Estimated: 25 mL/min — ABNORMAL LOW (ref >60)
Glucose, Bld: 234 mg/dL — ABNORMAL HIGH (ref 70–99)
Potassium: 4.4 mmol/L (ref 3.5–5.1)
Sodium: 153 mmol/L — ABNORMAL HIGH (ref 135–145)
Total Bilirubin: 8 mg/dL — ABNORMAL HIGH (ref 0.3–1.2)
Total Protein: 6.6 g/dL (ref 6.5–8.1)

## 2022-03-01 LAB — AMMONIA: Ammonia: 37 umol/L — ABNORMAL HIGH (ref 9–35)

## 2022-03-01 LAB — MAGNESIUM: Magnesium: 2.5 mg/dL — ABNORMAL HIGH (ref 1.7–2.4)

## 2022-03-01 LAB — PHOSPHORUS: Phosphorus: 4.6 mg/dL (ref 2.5–4.6)

## 2022-03-01 MED ORDER — SODIUM CHLORIDE 0.9 % IV BOLUS
1000.0000 mL | Freq: Once | INTRAVENOUS | Status: AC
  Administered 2022-03-01: 1000 mL via INTRAVENOUS

## 2022-03-01 MED ORDER — STERILE WATER FOR INJECTION IV SOLN
INTRAVENOUS | Status: DC
  Filled 2022-03-01: qty 992.8

## 2022-03-01 MED ORDER — ALTEPLASE 2 MG IJ SOLR
2.0000 mg | Freq: Once | INTRAMUSCULAR | Status: AC
  Administered 2022-03-01: 2 mg

## 2022-03-01 MED ORDER — ALTEPLASE 2 MG IJ SOLR
2.0000 mg | Freq: Once | INTRAMUSCULAR | Status: DC
  Filled 2022-03-01: qty 2

## 2022-03-01 MED ORDER — HEPARIN SODIUM (PORCINE) 5000 UNIT/ML IJ SOLN
5000.0000 [IU] | Freq: Three times a day (TID) | INTRAMUSCULAR | Status: DC
  Administered 2022-03-01 – 2022-03-02 (×4): 5000 [IU] via SUBCUTANEOUS
  Filled 2022-03-01 (×4): qty 1

## 2022-03-01 NOTE — Progress Notes (Signed)
10 Days Post-Op   Subjective/Chief Complaint: Confused, remains tachycardic, tachpneic, more output from midline wound   Objective: Vital signs in last 24 hours: Temp:  [98.3 F (36.8 C)-101 F (38.3 C)] 99.3 F (37.4 C) (02/12 0800) Pulse Rate:  [113-150] 137 (02/12 0600) Resp:  [18-32] 24 (02/12 0600) BP: (84-136)/(51-81) 86/66 (02/12 0600) SpO2:  [92 %-97 %] 92 % (02/12 0600) Last BM Date :  (no output in ostomy yet)  Intake/Output from previous day: 02/11 0701 - 02/12 0700 In: 1799.9 [I.V.:1213.1; IV Piggyback:586.9] Out: 2025 [Urine:1800; Drains:225] Intake/Output this shift: No intake/output data recorded.  Gen: appears ill Heart: regular,tachycardic low 130s Lungs: increased wob  Abd: soft, wound with a lot of enteric drainage.  LUQ drain with bilious output.  225cc/24hrs  JP 3 with serosang drainage, 0cc in 24 hrs.  JP 2 with scant bloody drainage. Colostomy GU: male purewick in place, clear yellow urine Ext: BLE edema   Lab Results:  Recent Labs    02/28/22 0500 03/01/22 0328  WBC 7.9 6.6  HGB 7.1* 7.5*  HCT 23.2* 24.5*  PLT 602* 597*   BMET Recent Labs    02/28/22 0500 03/01/22 0328  NA 152* 153*  K 4.2 4.4  CL 119* 119*  CO2 21* 21*  GLUCOSE 149* 234*  BUN 82* 99*  CREATININE 2.67* 3.00*  CALCIUM 7.8* 8.0*   PT/INR No results for input(s): "LABPROT", "INR" in the last 72 hours. ABG No results for input(s): "PHART", "HCO3" in the last 72 hours.  Invalid input(s): "PCO2", "PO2"  Studies/Results: No results found.  Anti-infectives: Anti-infectives (From admission, onward)    Start     Dose/Rate Route Frequency Ordered Stop   02/22/22 0800  vancomycin (VANCOREADY) IVPB 1250 mg/250 mL  Status:  Discontinued        1,250 mg 166.7 mL/hr over 90 Minutes Intravenous Every 24 hours 02/21/22 0233 02/23/22 0732   02/21/22 1030  micafungin (MYCAMINE) 150 mg in sodium chloride 0.9 % 100 mL IVPB  Status:  Discontinued        150 mg 107.5 mL/hr  over 1 Hours Intravenous Every 24 hours 02/21/22 0934 02/24/22 0821   02/21/22 0315  vancomycin (VANCOREADY) IVPB 2000 mg/400 mL        2,000 mg 200 mL/hr over 120 Minutes Intravenous  Once 02/21/22 0229 02/21/22 0502   02/17/22 1200  piperacillin-tazobactam (ZOSYN) IVPB 3.375 g        3.375 g 12.5 mL/hr over 240 Minutes Intravenous Every 8 hours 02/17/22 1103     02/15/22 1500  piperacillin-tazobactam (ZOSYN) IVPB 3.375 g        3.375 g 12.5 mL/hr over 240 Minutes Intravenous Every 8 hours 02/15/22 0836 02/17/22 0217   02/11/2022 1100  piperacillin-tazobactam (ZOSYN) IVPB 3.375 g        3.375 g 100 mL/hr over 30 Minutes Intravenous  Once 02/17/2022 1056 02/17/2022 1230   02/16/2022 0200  piperacillin-tazobactam (ZOSYN) IVPB 3.375 g  Status:  Discontinued        3.375 g 12.5 mL/hr over 240 Minutes Intravenous Every 8 hours 01/24/2022 2322 02/15/22 0836   02/04/2022 1900  piperacillin-tazobactam (ZOSYN) IVPB 3.375 g        3.375 g 100 mL/hr over 30 Minutes Intravenous  Once 01/20/2022 1857 02/03/2022 2012       Assessment/Plan: 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, proximal small bowel with ischemic necrotic proximal jejunum with perforation fecal contamination with abscess   POD 18/9 S/P Exploratory  laparotomy with resection of transverse colon, drainage of mesenteric abscess, resection of proximal jejunum with small bowel anastomosis and 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 by Dr. Brantley Stage.  Re-exploration with washout and new drain placement by Dr. Marlou Starks 2/2 - NPO due leak at small bowel anastomosis . - cont NPO/TNA - 3 JP drains present.  LUQ at SB leak site.  Other 2 in places where abscesses were unroofed in OR.  this appears not to be controlled right now with clinical condition as well as wound, will  do another ct scan today. If something that can help with drainage will pursue.  If not only real option surgically would be to widely open wound and leave him  with open abdomen to try and heal.  - dressing changes to midline wound, he should not have Dakins on this wound as it is dehisced- NO MORE DAKINS please - WOC following for new ostomy - cont zosyn - surgical path with large B-cell lymphoma    AKI - 3 today with 1800 cc urine 24 hours Fluid overload/hypoalbuminemia - Cont full rate TNA, but concentrate this.  Hypoxia/LLL PNA(resolved) - pulm toilet. IS.  PNA resolved.  however his wob increased today, will check abg and cxr DM - SSI.  Sugars fairly well controlled Hyperbilirubinemia - unclear etiology but suspect related to bowel leak and reabsorption across peritoneum.  Gallstones noted on CT, but no ductal dilatation.  Other LFTs essentially normal.  Continue to monitor.  Hep panel negative. Continue to monitor.  No other work up and intervention at this time. ABL anemia- stable Fevers - likely from leaking enteric contents  Shock - intermittently on low dose levo, likely a component of septic shock from his leaking small bowel  FEN: NPO, TPN VTE: sq heparin ID: Zosyn 1/23>>     Dispo: ICU   Rolm Bookbinder 03/01/2022

## 2022-03-01 NOTE — Progress Notes (Addendum)
OT Cancellation Note  Patient Details Name: Patrick [name] MRN: [id number] DOB: [birth year]   Cancelled Treatment:    Reason Eval/Treat Not Completed: Patient at procedure or test/ unavailable (CT). Remains Tachy. OT will continue to follow. Spoke with RN about midline wound drainage. If therapy able to work with him today, RN will change dressing prior to session.   12:30 Results of CT still pending, Pt HR remains in the 130's, BP slowly improving. Pt breathing heavily through mouth with increased WOB when OT checked in with RN. OT will continue to follow.   Saline 03/01/2022, 9:12 AM  [place] Office: [phone]

## 2022-03-01 NOTE — Progress Notes (Signed)
PHARMACY - TOTAL PARENTERAL NUTRITION CONSULT NOTE  Indication: Prolonged ileus  Patient Measurements: Height: 6' (182.9 cm) Weight: 104 kg (229 lb 4.5 oz) IBW/kg (Calculated) : 77.6 TPN AdjBW (KG): 86.6 Body mass index is 31.1 kg/m.  Assessment:  46 YOM presented with abdominal pain for 4 weeks, found to have retroperitoneal mass with necrotic jejunum, perforated fecal contamination and abscess. 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 Patient has been NPO for 4 days since admission. Patient/wife report he wasn't eating well for 4 weeks PTA and basically on a liquid diet. He endorses a ~10 lb weight loss during this time. Previously, he typically ate 2 meals a day (chicken, vegetables, beans and fruits) and 1 snack. Pharmacy consulted to dose TPN.  Glucose / Insulin: hx DM (A1C 6.6) on glipizide PTA. CBGs controlled <180 Received 22 units SSI in past 24 hrs + 55 units regular insulin in TPN bag Decadron IV x1 given preop 2/2 Electrolytes: Na 153, Cl 119, K 4.4, mag 2.5, Phos 4.6, Ca 7.8 (CoCa 9.88) Renal: AKI - SCr 3 (BL 1), BUN 99 Hepatic: LFTs WNL, Tbili remains elev at 8, TG 206 (propofol postop 2/2>>2/3), albumin <1.5 S/p thiamine x 3 days (1/26>1/28) Intake / Output; MIVF: UOP 0.7 ml/kg/hr, drain 225 mL, colostomy 0 ml; net +7.79 L this admit GI Imaging: 1/23 CT abd pelvis - Marked severity colitis with adjacent abscess  1/29 CT abd pelvis - suspicious for abscesses and PNA, small pleural effusion, anasarca  2/9- CT abd pelvis w/o contrast- New 2 x 3.5 cm RIGHT LATERAL abdomen/paracolic gutter collection/abscess and interval decrease in size of a 2.5 x 5 cm low pelvic collection/abscess, previously measured 4 x 6 cm. Interval decrease in size of the anterior LEFT abdominal collection/abscess containing drain, now measuring 5.9 x 8.6  cm, previously 12.4 x 13.9 cm.  GI Surgeries / Procedures: 1/24 s/p ex lap with resection of transverse colon, drainage of mesenteric abscess, resection of proximal jejunum with SB anastomosis and diverting R colostomy and long Hartman's pouch 1/30 IR per drain placement 2/2 ex lap w/ I&D of abdominal abscess, 2 abdominal drains placed  Central access: PICC 02/12/22 TPN start date: 02/12/22  Nutritional Goals:  RD Estimated Needs Total Energy Estimated Needs: 2500-2800 Total Protein Estimated Needs: 140-155 grams Total Fluid Estimated Needs: >2 L/day   TPN goal rate: 85 mL/hr to provide 149 g AA and 2574 kCal/day  Current Nutrition:  TPN- concentrated starting 02/25/2022 PM  NPO  Plan:  Continue TPN at rate 85 ml/hr (concentrated formula) to provide 149 g AA, 408 g CHO, 59 g lipids and 2574 kCal, meeting 100% of needs Electrolytes in TPN: Na 0 mEq/L, K 43 meq/L, Ca 2 mEq/L, Mg 1.5 mEq/L, Phos 9 mmol/L; Cl:Ac maximized acetate  Add standard MVI to TPN T-bili 8. Jaundice - remove trace elements until resolved, continue to hold chromium for now but consider adding back as renal function improves.  Increase insulin in TPN to 60 units Continue resistant SSI Q4H Monitor TPN labs daily until stable, then standard Mon/Thurs Monitor TG (SMOF lipids 25% of total kCal). Anticipate some degree of TG improvement with stopping propofol   Alanda Slim, PharmD, [facility] Clinical Pharmacist Please see AMION for all Pharmacists' Contact Phone Numbers 03/01/2022, 7:15 AM

## 2022-03-01 NOTE — Progress Notes (Signed)
Patient has been tachycardic in the 120's-130's. Patient afebrile. Patient not in pain. RN drew labs early. Paged MD Redmond Pulling at [phone]. No call back. Paged Trauma RN. Trauma RN at bedside.

## 2022-03-01 NOTE — Progress Notes (Signed)
Pt now tachycardic in 140's. Paged Wilson MD. Ordered 1L NS bolus.

## 2022-03-01 NOTE — Progress Notes (Signed)
PT Cancellation Note  Patient Details Name: Patrick [name] MRN: [id number] DOB: March 19, 1970   Cancelled Treatment:    Reason Eval/Treat Not Completed: Patient declined, no reason specified. Pt declining PT services at this time, breathing appears labored, also recently started on a pressor for BP management. PT will follow up at a later time.   Zenaida Niece 03/01/2022, 2:15 PM

## 2022-03-01 NOTE — Progress Notes (Deleted)
Levophed has been titrated between 4 mcg/min and off based on the pt's BP. The information did not cross over from the IV pump channel. This RN and a couple others tried to get the information to cross over, but were unsuccessful. This RN estimated when the titrations occurred on the [facility].

## 2022-03-01 NOTE — Consult Note (Signed)
NAME:  Patrick [name], MRN:  [id number], DOB:  1970/11/08, LOS: 13 ADMISSION DATE:  01/22/2022, CONSULTATION DATE:  02/06/2022 REFERRING MD:  Edison Pace, CHIEF COMPLAINT:  Post op Hypotension    History of Present Illness:  Patrick [name]. Finklea is a 52 y.o. Male with a significant PMH of DM with s/p of Left transmetatarsal amputation secondary to osteomyelitis, CAD s/p CABG with Exclusion of left atrial appendage (2022), arthritis, obesity, and anxiety, who presented to [facility] on 01/24/2022 after outpatient CT scan which resulted with questionable pneumatosis and possible pneumoperitoneum. Of note, he was seen by his PCP for severe abdominal pain x4 weeks that has increasingly progressed.   Patient was seen and evaluated by surgical team in which they believe on exam CT that he did not have free perforation/pneumoperitoneum/or peritoneal signs throughout the abdomen, however, did show an intra-abdominal mass measuring almost 15 cm at the level of the transverse colon splenic flexure.  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 IVF resuscitation and antibiotics started.   On 02/01/2022, patient was taken to the OR for Exploratory laparotomy with resection of transverse colon, drainage of mesenteric abscess, resection of proximal jejunum with small bowel anastomosis and 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. Post operatively patient developed hypotension requiring vasopressors.   Required re-exploration and washout with new drain placement on 2/2 with continued leak at small bowel anastomosis.  On 2/12 he had more work of breathing with worsening AKI and bilirubin and was placed back on low dose pressors, PCCM re-consulted in this setting   Pertinent  Medical History   Past Medical History:  Diagnosis Date   Anxiety    Arthritis    CAD (coronary artery disease)    s/p quad CABG   Chronic osteomyelitis of left foot ([place]) 12/20/2018   Chronic  osteomyelitis of right foot ([place]) 12/20/2018   Diabetes mellitus without complication (HCC)    Diabetic foot infection ([place]) 12/20/2018   Diabetic neuropathy ([place]) 12/20/2018   History of kidney stones    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   Morbid obesity ([place]) 12/20/2018   MSSA (methicillin susceptible Staphylococcus aureus) infection 12/20/2018   PONV (postoperative nausea and vomiting)     Significant Hospital Events: Including procedures, antibiotic start and stop dates in addition to other pertinent events   02/16/2022 - Admitted, IVF resuscitation and antibiotics 02/06/2022 - Exploratory Lap with resection of transverse colon, drainage of mesenteric abscess, resection of proximal jejunum with SB anastamosis and diverting right colostomy and long Harmans pouch. 1/26 PCCM called back due to tachypnea and borderline BP's, deemed stable and [place] again signed off  2/2 taken back to surgery for exploratory lap and abdominal I&D with possible bowel resection however unable to complete resection abdominal washout completed.  Postoperatively patient returned back to ICU on ventilator, PCCM reengaged 2/3 Acute agitation with concern for threats to self, psych was consulted and felt it was acute delirium, medications changes ordered.  2/4 Intermittent confusion persist but improved compared to day prior 2/12 more tachycardic, tachypneic, AKI, PCCM re-consult  Interim History / Subjective:  CT scan with small L pleural effusion, multiple fluid collections and abscesses, overall mildly improved   Objective   Blood pressure (!) 86/66, pulse (!) 137, temperature 99.3 F (37.4 C), temperature source Axillary, resp. rate (!) 24, height 6' (1.829 m), weight 104 kg, SpO2 92 %.  Intake/Output Summary (Last 24 hours) at 03/01/2022 [id number] Last data filed at 03/01/2022 0600 Gross per 24 hour  Intake 1604.89 ml  Output 1850 ml  Net -245.11 ml    Filed Weights   02/26/22 1001  02/27/22 0451 02/28/22 0500  Weight: 107.4 kg 104.2 kg 104 kg    General:  very ill-appearing M, awake, mild distress HEENT: MM pink/moist, sclera jaundiced Neuro: awake, following commands, speech unclear CV: s1s2 tachycardic, regular, no m/r/g PULM:  clear bilaterally, on 2L [place], tachypneic with mild-moderate dyspnea GI: soft, multiple drains, ostomy site and surgical site without erythema Extremities: warm/dry, no edema  Skin: no rashes or lesions    Resolved problems   Postoperative ventilator management -Extubated 2/3  Assessment & Plan:   Sepsis in the setting of intra-abdominal infection in the setting of small bowel versus leak Tachypnea, increased WOB Initial ex-lap January 24 -General surgery able to complete abdominal washout 2/2 but due to adhesions, inflammation, and significant contamination, continued leak at the anastomosis  P: -Three JP drains with continued scattered fluid collections and anastomotic leak, repeat CT reads mildly improved, management per surgery -1 L NS, check lactic acid -continue  Zosyn per surgery  -continue levophed to maintain MAP >65 -small effusion on CT chest, no infiltrate or significant pulmonary edema, ABG with compensated respiratory alkalosis, continue supportive care and supplemental O2 -wife at the bedside states that pt would not want like support unless there is a strong likelihood he would come off    Encephalopathy Waxing and waning throughout hospital course -add ammonia level   Hyperbilirubinemia Bili 8, mildly elevated AST/ALT -no signs of cholecystitis on CT -per surgery suspect related to bowel leak and re-absorption across peritoneum -hepatitis panel was negative -check ammonia, continue supportive care   Retroperitoneal mass Large B-cell Lymphoma -With invasion of the transverse colon with proximal small bowel and ischemic necrotic proximal jejunum with associated perforation and abdominal  abscesses P: -treatment for lymphoma pending recovery from current acute issues  Acute kidney injury  -Creatinine rising from 1.8 four days ago to 3 today, making urine -neg negative 5L since admission P: -1L IVF, follow creatinine, avoid nephrotoxins and follow UOP -Avoid nephrotoxins -Ensure adequate renal perfusion   Mild systolic congestive heart failure -Echocardiogram 1/29 revealed EF 50% with global hypokinesis and septal bounce suggestive of prior cardiac surgery and grade 2 diastolic dysfunction History of CAD, s/p CABG x5 P: -monitor volume status carefully  Hypoalbuminemia At risk malnutrition P: -Continue TPN  -Protein supplementation as able   Type 2 diabetes P: -Continue SSI  -CBG goal 140-180  Chronic bilateral osteomyelitis to feet -wound care  Best Practice (right click and "Reselect all SmartList Selections" daily)   Diet/type: NPO DVT prophylaxis: LMWH GI prophylaxis: N/A Lines: N/A Foley:  Yes, and it is still needed Code Status:  full code Last date of multidisciplinary goals of care discussion: Per primary   Signature:   CRITICAL CARE Performed by: Otilio Carpen Kenyatte Gruber   Total critical care time: 45 minutes  Critical care time was exclusive of separately billable procedures and treating other patients.  Critical care was necessary to treat or prevent imminent or life-threatening deterioration.  Critical care was time spent personally by me on the following activities: development of treatment plan with patient and/or surrogate as well as nursing, discussions with consultants, evaluation of patient's response to treatment, examination of patient, obtaining history from patient or surrogate, ordering and performing treatments and interventions, ordering and review of laboratory studies,  ordering and review of radiographic studies, pulse oximetry and re-evaluation of patient's condition.   Otilio Carpen Tamera Pingley, PA-C [place] [facility] See Amion for pager If no response to pager , please call [phone] until 7pm After 7:00 pm call Elink  [phone]?[place]

## 2022-03-01 NOTE — Progress Notes (Signed)
  Transition of Care [facility]) Screening Note   Patient Details  Name: Patrick [name] Date of Birth: February 15, 1970   Transition of Care [facility]) CM/SW Contact:    Benard Halsted, LCSW Phone Number: 03/01/2022, 8:41 AM    Transition of Care Department [facility]) has reviewed patient. We will continue to monitor patient advancement through interdisciplinary progression rounds. If new patient transition needs arise, please place a TOC consult.

## 2022-03-02 DIAGNOSIS — J9601 Acute respiratory failure with hypoxia: Secondary | ICD-10-CM

## 2022-03-02 LAB — CBC
HCT: 24.3 % — ABNORMAL LOW (ref 39.0–52.0)
Hemoglobin: 7.2 g/dL — ABNORMAL LOW (ref 13.0–17.0)
MCH: 24.3 pg — ABNORMAL LOW (ref 26.0–34.0)
MCHC: 29.6 g/dL — ABNORMAL LOW (ref 30.0–36.0)
MCV: 82.1 fL (ref 80.0–100.0)
Platelets: 606 10*3/uL — ABNORMAL HIGH (ref 150–400)
RBC: 2.96 MIL/uL — ABNORMAL LOW (ref 4.22–5.81)
RDW: 20.9 % — ABNORMAL HIGH (ref 11.5–15.5)
WBC: 5.3 10*3/uL (ref 4.0–10.5)
nRBC: 0 % (ref 0.0–0.2)

## 2022-03-02 LAB — BASIC METABOLIC PANEL
Anion gap: 11 (ref 5–15)
BUN: 105 mg/dL — ABNORMAL HIGH (ref 6–20)
CO2: 21 mmol/L — ABNORMAL LOW (ref 22–32)
Calcium: 7.9 mg/dL — ABNORMAL LOW (ref 8.9–10.3)
Chloride: 124 mmol/L — ABNORMAL HIGH (ref 98–111)
Creatinine, Ser: 3.27 mg/dL — ABNORMAL HIGH (ref 0.61–1.24)
GFR, Estimated: 22 mL/min — ABNORMAL LOW (ref >60)
Glucose, Bld: 272 mg/dL — ABNORMAL HIGH (ref 70–99)
Potassium: 4.8 mmol/L (ref 3.5–5.1)
Sodium: 156 mmol/L — ABNORMAL HIGH (ref 135–145)

## 2022-03-02 LAB — GLUCOSE, CAPILLARY
Glucose-Capillary: 217 mg/dL — ABNORMAL HIGH (ref 70–99)
Glucose-Capillary: 289 mg/dL — ABNORMAL HIGH (ref 70–99)

## 2022-03-02 MED ORDER — MORPHINE SULFATE (PF) 2 MG/ML IV SOLN
0.5000 mg | Freq: Once | INTRAVENOUS | Status: AC
  Administered 2022-03-02: 0.5 mg via INTRAVENOUS
  Filled 2022-03-02: qty 1

## 2022-03-02 MED ORDER — MIDAZOLAM HCL 2 MG/2ML IJ SOLN: 2.0000 mg | INTRAMUSCULAR | Status: DC | PRN

## 2022-03-02 MED ORDER — STERILE WATER FOR INJECTION IV SOLN
INTRAVENOUS | Status: DC
  Filled 2022-03-02: qty 992.8

## 2022-03-02 MED ORDER — MORPHINE BOLUS VIA INFUSION
5.0000 mg | INTRAVENOUS | Status: DC | PRN
  Administered 2022-03-02 (×2): 5 mg via INTRAVENOUS

## 2022-03-02 MED ORDER — SODIUM CHLORIDE 0.9 % IV SOLN: INTRAVENOUS | Status: DC

## 2022-03-02 MED ORDER — ACETAMINOPHEN 650 MG RE SUPP: 650.0000 mg | Freq: Four times a day (QID) | RECTAL | Status: DC | PRN

## 2022-03-02 MED ORDER — MORPHINE 100MG IN NS 100ML (1MG/ML) PREMIX INFUSION
0.0000 mg/h | INTRAVENOUS | Status: DC
  Administered 2022-03-02: 5 mg/h via INTRAVENOUS
  Filled 2022-03-02 (×2): qty 100

## 2022-03-02 MED ORDER — ACETAMINOPHEN 325 MG PO TABS: 650.0000 mg | ORAL_TABLET | Freq: Four times a day (QID) | ORAL | Status: DC | PRN

## 2022-03-02 MED ORDER — POLYVINYL ALCOHOL 1.4 % OP SOLN: 1.0000 [drp] | Freq: Four times a day (QID) | OPHTHALMIC | Status: DC | PRN

## 2022-03-02 MED ORDER — GLYCOPYRROLATE 0.2 MG/ML IJ SOLN: 0.2000 mg | INTRAMUSCULAR | Status: DC | PRN

## 2022-03-02 MED ORDER — GLYCOPYRROLATE 1 MG PO TABS: 1.0000 mg | ORAL_TABLET | ORAL | Status: DC | PRN

## 2022-03-02 MED ORDER — NOREPINEPHRINE 4 MG/250ML-% IV SOLN: 0.0000 ug/min | INTRAVENOUS | Status: DC

## 2022-03-05 ENCOUNTER — Encounter (HOSPITAL_COMMUNITY): Payer: Self-pay

## 2022-03-19 NOTE — Accreditation Note (Signed)
Restraint Death within 24 hours of bilateral soft wrist restraints logged 03/03/2022 at 0802 by [facility] RNBSN

## 2022-03-19 NOTE — Progress Notes (Incomplete)
Paged Zenia Resides MD.

## 2022-03-19 NOTE — Progress Notes (Signed)
Occupational Therapy Discharge Patient Details Name: Patrick [name] MRN: [id number] DOB: 1970-12-27 Today's Date: 2022/03/22   Patient discharged from OT services secondary to  Wife as elected to change pt to comfort care . Acute OT services no longer warranted at this time.  Please see latest therapy progress note for current level of functioning and progress toward goals.       Ailene Ravel, OTR/L,CBIS  Supplemental OT - [facility] and [facility] Secure Chat Preferred   2022-03-22, 8:51 AM

## 2022-03-19 NOTE — Progress Notes (Signed)
NAME:  Patrick [name], MRN:  [id number], DOB:  [birth year], LOS: 21 ADMISSION DATE:  01/27/2022, CONSULTATION DATE:  02/14/2022 REFERRING MD:  Edison Pace, CHIEF COMPLAINT:  Post op Hypotension    History of Present Illness:  Patrick [name] is a 52 y.o. Male with a significant PMH of DM with s/p of Left transmetatarsal amputation secondary to osteomyelitis, CAD s/p CABG with Exclusion of left atrial appendage (2022), arthritis, obesity, and anxiety, who presented to [facility] on 01/18/2022 after outpatient CT scan which resulted with questionable pneumatosis and possible pneumoperitoneum. Of note, he was seen by his PCP for severe abdominal pain x4 weeks that has increasingly progressed.   Patient was seen and evaluated by surgical team in which they believe on exam CT that he did not have free perforation/pneumoperitoneum/or peritoneal signs throughout the abdomen, however, did show an intra-abdominal mass measuring almost 15 cm at the level of the transverse colon splenic flexure.  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 IVF resuscitation and antibiotics started.   On 02/07/2022, patient was taken to the OR for Exploratory laparotomy with resection of transverse colon, drainage of mesenteric abscess, resection of proximal jejunum with small bowel anastomosis and 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. Post operatively patient developed hypotension requiring vasopressors.   Required re-exploration and washout with new drain placement on 2/2 with continued leak at small bowel anastomosis.  On 2/12 he had more work of breathing with worsening AKI and bilirubin and was placed back on low dose pressors, PCCM re-consulted in this setting   Pertinent  Medical History   Past Medical History:  Diagnosis Date   Anxiety    Arthritis    CAD (coronary artery disease)    s/p quad CABG   Chronic osteomyelitis of left foot ([place]) 12/20/2018   Chronic  osteomyelitis of right foot ([place]) 12/20/2018   Diabetes mellitus without complication (HCC)    Diabetic foot infection ([place]) 12/20/2018   Diabetic neuropathy ([place]) 12/20/2018   History of kidney stones    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   Morbid obesity ([place]) 12/20/2018   MSSA (methicillin susceptible Staphylococcus aureus) infection 12/20/2018   PONV (postoperative nausea and vomiting)     Significant Hospital Events: Including procedures, antibiotic start and stop dates in addition to other pertinent events   02/05/2022 - Admitted, IVF resuscitation and antibiotics 02/08/2022 - Exploratory Lap with resection of transverse colon, drainage of mesenteric abscess, resection of proximal jejunum with SB anastamosis and diverting right colostomy and long Harmans pouch. 1/26 PCCM called back due to tachypnea and borderline BP's, deemed stable and [place] again signed off  2/2 taken back to surgery for exploratory lap and abdominal I&D with possible bowel resection however unable to complete resection abdominal washout completed.  Postoperatively patient returned back to ICU on ventilator, PCCM reengaged 2/3 Acute agitation with concern for threats to self, psych was consulted and felt it was acute delirium, medications changes ordered.  2/4 Intermittent confusion persist but improved compared to day prior 2/12 more tachycardic, tachypneic, AKI, PCCM re-consult Mar 25, 2022 worsening respiratory status, met with family and plan to transition to comfort care  Interim History / Subjective:  Pt with worsening respiratory distress today, met with surgery and patient's wife at the bedside.  There is no easy treatment plan for ongoing intra-abdominal abscesses and would likely require multiple trips to the OR with open abdomen and minimal chance  of healing.  He also has worsening renal failure today and underlying lymphoma.  Wife decided to move forward with comfort care and family coming to the  bedside   Objective   Blood pressure 116/75, pulse (!) 102, temperature 98.4 F (36.9 C), temperature source Axillary, resp. rate 16, height 6' (1.829 m), weight 104 kg, SpO2 99 %.        Intake/Output Summary (Last 24 hours) at 03-25-22 0740 Last data filed at 03/25/22 5397 Gross per 24 hour  Intake 3367.24 ml  Output 3031 ml  Net 336.24 ml    Filed Weights   02/26/22 1001 02/27/22 0451 02/28/22 0500  Weight: 107.4 kg 104.2 kg 104 kg    General:  very ill-appearing M, awake, in respiratory distress HEENT: MM pink/moist, sclera jaundiced Neuro: awake, disoriented, encephalopathic CV: s1s2 tachycardic, regular, no m/r/g PULM:  course bilaterally, increased accessory muscle use, rhonchi bilaterally on non-rebreather GI: soft, multiple drains, ostomy site and surgical site with drainage Extremities: warm/dry, no edema  Skin: no rashes or lesions    Resolved problems   Postoperative ventilator management -Extubated 2/3  Assessment & Plan:    Acute Hypoxic Respiratory Failure in the setting of sepsis in the setting of intra-abdominal infection in the setting of small bowel versus leak Initial ex-lap January 24 -General surgery able to complete abdominal washout 2/2 but due to adhesions, inflammation, and significant contamination, continued leak at the anastomosis  P: -Three JP drains with continued scattered fluid collections and anastomotic leak, per surgery would require multiple trips to the OR and open abdomen to attempt drainage -goals of care discussion with wife and surgery, given the likelihood that his surgical issues will resolve, underlying lymphoma and worsening multi-organ failure decision made to transition to full comfort-focused care     Encephalopathy Waxing and waning throughout hospital course -ammonia ok   Hyperbilirubinemia Bili 8, mildly elevated AST/ALT     Retroperitoneal mass Large B-cell Lymphoma -With invasion of the transverse  colon with proximal small bowel and ischemic necrotic proximal jejunum with associated perforation and abdominal abscesses  Acute kidney injury  Worsening creatinine today   Mild systolic congestive heart failure -Echocardiogram 1/29 revealed EF 50% with global hypokinesis and septal bounce suggestive of prior cardiac surgery and grade 2 diastolic dysfunction History of CAD, s/p CABG x5   Hypoalbuminemia At risk malnutrition P: -stop TPN  Type 2 diabetes   Chronic bilateral osteomyelitis to feet -wound care  Best Practice (right click and "Reselect all SmartList Selections" daily)   Diet/type: NPO DVT prophylaxis: LMWH GI prophylaxis: N/A Lines: N/A Foley:  Yes, and it is still needed Code Status:  DNR Last date of multidisciplinary goals of care discussion: 03/25/2022 comfort, as above  Signature:   CRITICAL CARE Performed by: Otilio Carpen Coren Crownover   Total critical care time: 42 minutes  Critical care time was exclusive of separately billable procedures and treating other patients.  Critical care was necessary to treat or prevent imminent or life-threatening deterioration.  Critical care was time spent personally by me on the following activities: development of treatment plan with patient and/or surrogate as well as nursing, discussions with consultants, evaluation of patient's response to treatment, examination of patient, obtaining history from patient or surrogate, ordering and performing treatments and interventions, ordering and review of laboratory studies, ordering and review of radiographic studies, pulse oximetry and re-evaluation of patient's condition.   Otilio Carpen Tanya Crothers, PA-C [place] [facility] See Amion for pager If no response  to pager , please call [phone] until 7pm After 7:00 pm call Elink  [phone]?[place]

## 2022-03-19 NOTE — Discharge Summary (Signed)
DEATH SUMMARY   Patient Details  Name: Patrick [name] MRN: [id number] DOB: 10-24-1970  Admission/Discharge Information   Admit Date:  03-08-2022  Date of Death: Date of Death: 03/29/2022  Time of Death: Time of Death: Apr 25, 1830  Length of Stay: 51/17/2024  Referring Physician: Caryl Bis, MD   Reason(s) for Hospitalization  Large bowel obstruction due intra-abdominal mass with perforation  Diagnoses  Preliminary cause of death:  Secondary Diagnoses (including complications and co-morbidities):  Principal Problem:   Abdominal mass Active Problems:   Protein-calorie malnutrition, severe   Ischemic bowel disease ([place])   Pressure injury of skin   Delirium due to another medical condition   Acute delirium   Septic shock ([place])   Acute respiratory failure with hypoxia [facility])   [place] [facility] Course (including significant findings, care, treatment, and services provided and events leading to death)  Patrick [name] is a 52 y.o. year old male with a PMH including, but not limited to, diabetes (A1c 6.6) and HLD who presented to the ED with a cc of abdominal pain for 4 weeks with associated anorexia and weight loss. On presentation he was tachycardic (HR 117bpm) and had a leukocytosis of 19,700. Workup included a CT scan of the abdomen and pelvis which revealed a large intra-abdominal mass with concern for necrosis and intestinal perforation. The patient was taken emergently to the operating room for the below operation by Dr. Brantley Stage, where a large retroperitoneal mass was found that was adherent to the mesentery of the jejunum, adherent to the transverse colon, with necrosis of the jejunum proximal to this area. The patient was taken to the ICU post-operatively in critical but stable condition. He had an NG tube in place to [facility]. He was continued on IV antibiotics. The wound ostomy nurses were consulted for new colostomy. On POD#2 TPN was started due to anticipated ileus and known poor PO intake prior to  surgery. Critical Care was consulted to help manage the patient due to ongoing tachycardia, hypotension, and AKI.  On POD#5 the patient had a fever and elevated white blood cell count so a CT scan of the abdomen and pelvis was ordered that was significant for multiple intra-abdominal fluid collections. IR was consulted for percutaneous drain placement and placed a 12 F drain in the LUQ on 51/30. Of, note the CT also showed a LLL pneumonia, patient already on Zosyn. On POD#7 the patients kidney function was still not recovering and was worsening, despite fluid resuscitation and assistance from the medical team, so nephrology was consulted. They agreed with a lasix drip for volume overload and did not feel the patient needed RRT due to adequate urine output with diuresis. On 2/2 the patients surgical drain was noted to contain bright yellow drainage concerning for an anastomotic small bowel leak.  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 After a long risk/benefit discussion with the patient and his family the decision was made to return to the operating room for washout and attempt to repair/control small bowel leak and place more drains as needed. See operation listed below by Dr. Marlou Starks. Intraoperatively a small bowel leak was able to be identified in the area of the LUQ but because of the amount of contamination and inflammation the small bowel Spratt and definite source of the leak could not be indentified. New drains were placed in both paracolic gutters, along with a third drain over the area of maximum contamination, and retention sutures  were placed during abdominal closure. Post-operatively he was admitted to the ICU on the ventilatory with critical care managing his respiratory failure and septic shock due to intra-abdominal leak. Patient was extubated on 2/3.  His NG tube fell out and, after 3 failed attempts, was left out with patient strict NPO. On 2/4 critical  care signed off. Patient remained NPO, TPN. Psych was consulted for delirium. The patient developed fevers to 103 post-operatively and required levophed to maintain his blood pressure. On 2/6 Blood cultures were negative for bacteremia and chest x-ray showed low lung volumes but interval improvement in lung aeration compared to prior. Palliative care was consulted and met the family 2/7 to establish [place] and they followed the patient. On 2/8 there was enteric drainage noted from midline wound and the patient had an increased oxygen requirement. CT chest/abdomen/pelvis was repeated 02/26/22 to look for un-drained fluid collections and there was a small fluid collection not amenable to drainage per IR. On 2/12 the patient remained tachycardic, tachypneic, was confused, and was noted to have increased drainage from the midline abdominal wound. Due to concern for small bowel leak not controlled by current drains, CT scan was again repeated 2/12 which showed multiple small fluid collections. The kidney function was also worse with a BUN of 99 and creatinine of 3.0. critical care was re-engaged for assistance with management of sepsis and worsening respiratory failure and the patient was started back on IV pressor support. On 51-22-2024 the patient was febrile, more tachycardic and had work of breathing increased from the day prior, worsening kidney function, and, due to sepsis from uncontrolled intra-abdominal infection, was going to require intubation due to his deteriorating clinical condition. The only real surgical option for management of small bowel leak would be completely opening his abdominal incisions for wide drainage in attempt to fully evacuate small bowel contents. The surgical options and aggressive treatment of intestinal leak including intubation, multiple surgeries, tracheostomy, possible dialysis, were all discussed with the patient and his wife. The patients wife elected to transition to comfort measures on  March 11, 2022. The patient passed away at [place] on Mar 11, 2022.  Pertinent Labs and Studies  Significant Diagnostic Studies DG Abd 1 View  Result Date: 03/01/2022 CLINICAL DATA:  Nasogastric tube placement. EXAM: ABDOMEN - 1 VIEW COMPARISON:  February 19, 2022. FINDINGS: Distal tip of nasogastric tube is seen in proximal stomach. IMPRESSION: Distal tip of nasogastric tube seen in proximal stomach. Electronically Signed   By: Marijo Conception M.D.   On: 03/01/2022 14:21   CT CHEST ABDOMEN PELVIS WO CONTRAST  Result Date: 03/01/2022 CLINICAL DATA:  Shortness of breath. Follow-up abdominal abscesses. Status post transverse colectomy. Lymphoma. EXAM: CT CHEST, ABDOMEN AND PELVIS WITHOUT CONTRAST TECHNIQUE: Multidetector CT imaging of the chest, abdomen and pelvis was performed following the standard protocol without IV contrast. RADIATION DOSE REDUCTION: This exam was performed according to the departmental dose-optimization program which includes automated exposure control, adjustment of the mA and/or kV according to patient size and/or use of iterative reconstruction technique. COMPARISON:  02/26/2022 FINDINGS: CT CHEST FINDINGS Cardiovascular: The heart is normal in size. No pericardial effusion. No evidence of thoracic aortic aneurysm. Atherosclerotic calcifications of the arch. Severe three-vessel coronary atherosclerosis. Postsurgical changes related to prior CABG. Right arm PICC terminates at the cavoatrial junction. Mediastinum/Nodes: No suspicious mediastinal lymphadenopathy. Visualized thyroid is unremarkable. Lungs/Pleura: Small left pleural effusion, unchanged. Lingular and bilateral lower lobe opacities, likely atelectasis, unchanged. No focal consolidation. No suspicious pulmonary nodules, noting motion degradation.  No pneumothorax. Musculoskeletal: Degenerative changes of the lower thoracic spine. Median sternotomy. CT ABDOMEN PELVIS FINDINGS Hepatobiliary: Unenhanced liver is unremarkable. Layering gallstones  (series 3/image 65), without associated inflammatory changes. No intrahepatic or extrahepatic duct dilatation. Pancreas: Within normal limits. Spleen: Normal in size. Mild pericapsular fluid inferiorly (series 3/image 32), unchanged. Adrenals/Urinary Tract: Adrenal glands are within normal limits. 13 mm lateral left upper pole renal cyst (series 3/image 37), poorly evaluated on unenhanced CT, likely benign. Right kidney is within normal limits. No renal calculi or hydronephrosis. Bladder is within normal limits. Stomach/Bowel: Stomach is within normal limits. No evidence of bowel obstruction. Normal appendix (series 3/image 109). Status post transverse colectomy with right mid abdominal colostomy. Vascular/Lymphatic: No evidence of abdominal aortic aneurysm. Atherosclerotic calcifications of the abdominal aorta and branch vessels. Abdominal/retroperitoneal lymphadenopathy with index 3.7 cm short axis gastrohepatic node (series 3/image 60), unchanged. Reproductive: Prostate is unremarkable. Other: 5.1 x 8.4 cm gas and fluid collection beneath the left anterior abdominal Hoeppner (series 3/image 70), with associated surgical drain, previously 5.9 x 8.6 cm. Mottled gas beneath the right mid anterior abdominal Curto (series 3/image 34), without well-defined rim/Strehlow, with adjacent surgical drain, unchanged. 3.0 x 1.6 cm fluid collection along the right pericolic gutter (series 3/image 94), previously 2.1 x 3.6 cm, mildly improved. Small volume pelvic fluid (series 3/image 123), unchanged. Musculoskeletal: Mild degenerative changes of the lumbar spine. IMPRESSION: Status post transverse colectomy with right mid abdominal colostomy. Multiple fluid collections/abscesses with scattered gas and indwelling surgical drain, overall mildly improved, as described above. Small left pleural effusion with scattered atelectasis, unchanged. No findings suspicious for pneumonia. Stable abdominal/retroperitoneal lymphadenopathy in this  patient with known lymphoma. Additional stable ancillary findings as above. Electronically Signed   By: Julian Hy M.D.   On: 03/01/2022 09:57   DG Chest Port 1 View  Result Date: 03/01/2022 CLINICAL DATA:  Pneumonia EXAM: PORTABLE CHEST 1 VIEW COMPARISON:  Chest radiograph February 25, 2022 and chest CT February 26, 2022 FINDINGS: Cardiac monitoring leads project over the chest. Prior median and CABG. Left atrial occlusion device. The heart size and mediastinal contours are unchanged. Low lung volumes. Opacities in the left-greater-than-right lung bases with partial obscuration of the left hemidiaphragm. Probable small left pleural effusion. The visualized skeletal structures are unchanged. IMPRESSION: 1. Low lung volumes with opacities in the left-greater-than-right lung bases, which may represent atelectasis or pneumonia. 2. Probable small left pleural effusion. Electronically Signed   By: Dahlia Bailiff M.D.   On: 03/01/2022 08:52   CT CHEST ABDOMEN PELVIS WO CONTRAST  Result Date: 02/26/2022 CLINICAL DATA:  52 year old male with follow-up abdominal abscesses and drains. History of SBR and transverse colectomy, and lymphoma. Fever and sepsis. EXAM: CT CHEST, ABDOMEN AND PELVIS WITHOUT CONTRAST TECHNIQUE: Multidetector CT imaging of the chest, abdomen and pelvis was performed following the standard protocol without IV contrast. RADIATION DOSE REDUCTION: This exam was performed according to the departmental dose-optimization program which includes automated exposure control, adjustment of the mA and/or kV according to patient size and/or use of iterative reconstruction technique. COMPARISON:  02/25/2022 and prior chest radiographs. 02/16/2018 for CT drainage, 02/15/2022 abdominal/pelvic CT and other studies FINDINGS: Please note that parenchymal and vascular abnormalities may be missed as intravenous contrast was not administered. CT CHEST FINDINGS Cardiovascular: Heart size is within normal limits.  CABG changes and LEFT atrial clipping are identified. Coronary artery and aortic atherosclerotic calcifications are present. There is no evidence of thoracic aortic aneurysm or pericardial effusion. A RIGHT PICC line  is present with tip at the SUPERIOR cavoatrial junction. Mediastinum/Nodes: No enlarged mediastinal, hilar, or axillary lymph nodes. Thyroid gland, trachea, and esophagus demonstrate no significant findings. Lungs/Pleura: Moderate bilateral LOWER lung atelectasis and/or consolidation identified. A small LEFT pleural effusion is present. There is no evidence of pneumothorax. Musculoskeletal: No acute or suspicious bony abnormalities are noted. CT ABDOMEN PELVIS FINDINGS Hepatobiliary: The liver and gallbladder are unchanged. A small amount of perihepatic fluid is not significantly changed. Cholelithiasis again noted. No definite biliary dilatation identified. Pancreas: No significant change or definite abnormality. Spleen: UPPER limits normal spleen size again noted. Perisplenic/subcapsular fluid is not significantly changed with 5 x 4 cm measurable fluid (image 67: Series 3). Adrenals/Urinary Tract: The kidneys and adrenal glands are unchanged. No definite bladder abnormality noted. Stomach/Bowel: Bowel surgical changes are again identified. No dilated bowel loops or evidence of bowel obstruction identified. RIGHT LOWER quadrant ostomy again noted. The appendix is unremarkable. Vascular/Lymphatic: Enlarged abdominal, retroperitoneal and mesenteric lymph nodes are unchanged. An index 3.7 cm celiac node (57:3) is unchanged. No new or enlarging lymph nodes are identified. Reproductive: Prostate is unremarkable. Other: Percutaneous drains within the anterior RIGHT abdomen, anterior to central LEFT abdomen and LOWER abdominal LEFT para colic gutter are noted. The anterior LEFT abdominal collection/abscess containing a drain has decreased in size, now measuring 5.9 x 8.6 cm (67:3), previously 12.4 x 13.9 cm.  A 2.5 x 5 cm low pelvic collection/abscess (120:3), previously measured 4 x 6 cm. A 2 x 3.5 cm RIGHT LATERAL abdomen/paracolic gutter collection/abscess is now noted (93:3). No other new or enlarging collections/abscess is identified. Scattered areas of pneumoperitoneum within the anterior abdomen are again noted as well as stranding/inflammation within the abdomen and pelvis. Subcutaneous stranding/edema is again noted. Musculoskeletal: No acute bony abnormalities are identified. IMPRESSION: 1. New 2 x 3.5 cm RIGHT LATERAL abdomen/paracolic gutter collection/abscess and interval decrease in size of a 2.5 x 5 cm low pelvic collection/abscess, previously measured 4 x 6 cm. 2. Interval decrease in size of the anterior LEFT abdominal collection/abscess containing drain, now measuring 5.9 x 8.6 cm, previously 12.4 x 13.9 cm. 3. Pneumoperitoneum, abdominal/pelvic inflammation/stranding and small amount of perihepatic/perisplenic fluid again noted. 4. Moderate bilateral LOWER lung atelectasis and/or consolidation (possible pneumonia) with small LEFT pleural effusion. 5. Unchanged abdominal, retroperitoneal and mesenteric lymphadenopathy. 6.  Aortic Atherosclerosis (ICD10-I70.0). Electronically Signed   By: Margarette Canada M.D.   On: 02/26/2022 11:02   DG CHEST PORT 1 VIEW  Result Date: 02/25/2022 CLINICAL DATA:  Dyspnea in a 52 year old male. EXAM: PORTABLE CHEST 1 VIEW COMPARISON:  February 23, 2022 FINDINGS: EKG leads project over the chest. Post median sternotomy for CABG. Signs of LEFT atrial appendage clipping as well. Lung volumes remain low with persistent bibasilar airspace disease over the LEFT and RIGHT hemidiaphragm. No new areas of airspace disease. No pneumothorax. RIGHT-sided PICC line remains in place tip at the area of the caval to atrial junction. On limited assessment no acute skeletal process. IMPRESSION: 1. Low lung volumes with persistent bibasilar airspace disease. Electronically Signed   By: Zetta Bills M.D.   On: 02/25/2022 08:35   DG CHEST PORT 1 VIEW  Result Date: 02/23/2022 CLINICAL DATA:  Hypoxia. EXAM: PORTABLE CHEST 1 VIEW COMPARISON:  Chest x-ray 02/17/2022 FINDINGS: Stable surgical changes from triple bypass surgery. Right PICC line is stable. The lungs demonstrate much improved aeration since the prior chest film. Low lung volumes with vascular crowding and bibasilar atelectasis but no infiltrates or effusions. IMPRESSION:  Low lung volumes with vascular crowding and bibasilar atelectasis. Electronically Signed   By: Marijo Sanes M.D.   On: 02/23/2022 10:07   DG Abd 1 View  Result Date: 03/04/2022 CLINICAL DATA:  NG tube EXAM: ABDOMEN - 1 VIEW COMPARISON:  Abdominal x-ray 02/06/2022 FINDINGS: Nasogastric tube tip is at the level of the gastric body. No dilated bowel loops are seen. Sternotomy wires are present. There is left basilar atelectasis. IMPRESSION: Nasogastric tube tip is at the level of the gastric body. Electronically Signed   By: Ronney Asters M.D.   On: 02/21/2022 18:06   VAS [place] LOWER EXTREMITY VENOUS (DVT)  Result Date: 03/03/2022  Lower Venous DVT Study Patient Name:  Patrick [name]  Date of Exam:   02/18/2022 Medical Rec #: [id number]     Accession #:    [id number] Date of Birth: 01-22-1970    Patient Gender: M Patient Age:   49 years Exam Location:  [facility] Procedure:      VAS [place] LOWER EXTREMITY VENOUS (DVT) Referring Phys: Terrilee Croak --------------------------------------------------------------------------------  Indications: Swelling, and Edema.  Comparison Study: no prior Performing Technologist: Archie Patten RVS  Examination Guidelines: A complete evaluation includes B-mode imaging, spectral Doppler, color Doppler, and power Doppler as needed of all accessible portions of each vessel. Bilateral testing is considered an integral part of a complete examination. Limited examinations for reoccurring indications may be performed as noted. The reflux portion of  the exam is performed with the patient in reverse Trendelenburg.  +---------+---------------+---------+-----------+----------+-------------------+ RIGHT    CompressibilityPhasicitySpontaneityPropertiesThrombus Aging      +---------+---------------+---------+-----------+----------+-------------------+ CFV      Full           Yes      Yes                                      +---------+---------------+---------+-----------+----------+-------------------+ SFJ      Full                                                             +---------+---------------+---------+-----------+----------+-------------------+ FV Prox  Full                                                             +---------+---------------+---------+-----------+----------+-------------------+ FV Mid   Full                                                             +---------+---------------+---------+-----------+----------+-------------------+ FV DistalFull                                                             +---------+---------------+---------+-----------+----------+-------------------+ PFV  Full                                                             +---------+---------------+---------+-----------+----------+-------------------+ POP      Full           Yes      Yes                                      +---------+---------------+---------+-----------+----------+-------------------+ PTV      Full                                                             +---------+---------------+---------+-----------+----------+-------------------+ PERO                                                  Not well visualized +---------+---------------+---------+-----------+----------+-------------------+   +---------+---------------+---------+-----------+----------+-------------------+ LEFT     CompressibilityPhasicitySpontaneityPropertiesThrombus Aging       +---------+---------------+---------+-----------+----------+-------------------+ CFV      Full           Yes      Yes                                      +---------+---------------+---------+-----------+----------+-------------------+ SFJ      Full                                                             +---------+---------------+---------+-----------+----------+-------------------+ FV Prox  Full                                                             +---------+---------------+---------+-----------+----------+-------------------+ FV Mid   Full                                                             +---------+---------------+---------+-----------+----------+-------------------+ FV DistalFull                                                             +---------+---------------+---------+-----------+----------+-------------------+ PFV      Full                                                             +---------+---------------+---------+-----------+----------+-------------------+  POP      Full           Yes      Yes                                      +---------+---------------+---------+-----------+----------+-------------------+ PTV      Full                                                             +---------+---------------+---------+-----------+----------+-------------------+ PERO                                                  Not well visualized +---------+---------------+---------+-----------+----------+-------------------+     Summary: BILATERAL: - No evidence of deep vein thrombosis seen in the lower extremities, bilaterally. -No evidence of popliteal cyst, bilaterally.   *See table(s) above for measurements and observations. Electronically signed by Jamelle Haring on 03/15/2022 at 7:28:21 AM.    Final    DG CHEST PORT 1 VIEW  Result Date: 02/17/2022 CLINICAL DATA:  Hypoxia. EXAM: PORTABLE CHEST 1 VIEW COMPARISON:  February 15, 2022. FINDINGS: Stable cardiomegaly. Status post coronary bypass graft. Stable probable right basilar subsegmental atelectasis. Stable left basilar atelectasis or infiltrate is noted with associated pleural effusion. Bony thorax is unremarkable. IMPRESSION: Stable bilateral lung opacities as noted above. Electronically Signed   By: Marijo Conception M.D.   On: 02/17/2022 11:57   CT GUIDED VISCERAL FLUID DRAIN BY PERC CATH  Result Date: 02/16/2022 INDICATION: 52 year old male abdominal abscess referred for drainage EXAM: CT GUIDED DRAINAGE OF  ABSCESS MEDICATIONS: The patient is currently admitted to the hospital and receiving intravenous antibiotics. The antibiotics were administered within an appropriate time frame prior to the initiation of the procedure. ANESTHESIA/SEDATION: 0.5 mg IV Versed 0 mcg IV Fentanyl Moderate Sedation Time:  0 minutes The patient was continuously monitored during the procedure by the interventional radiology nurse under my direct supervision. COMPLICATIONS: None TECHNIQUE: Informed written consent was obtained from the patient after a thorough discussion of the procedural risks, benefits and alternatives. All questions were addressed. Maximal Sterile Barrier Technique was utilized including caps, mask, sterile gowns, sterile gloves, sterile drape, hand hygiene and skin antiseptic. A timeout was performed prior to the initiation of the procedure. PROCEDURE: The operative field was prepped with Chlorhexidine in a sterile fashion, and a sterile drape was applied covering the operative field. A sterile gown and sterile gloves were used for the procedure. Local anesthesia was provided with 1% Lidocaine. Patient was positioned supine on the CT gantry table. Scout CT acquired for planning purposes. Patient is prepped and draped in the usual sterile fashion. 1% lidocaine was used for local anesthesia. Using CT guidance and modified Seldinger technique, a 12 French drain was placed into the  fluid and gas collection of the left upper quadrant. Once the drain was in position, proximally 350 cc of frankly purulent material was evacuated. A gravity drain was attached given the presumed bowel injury/tumor. Drain was sutured in position. Patient tolerated the procedure well and remained hemodynamically stable throughout. No complications were encountered  and no significant blood loss. FINDINGS: Planning CT demonstrates fluid and gas collection of the left upper quadrant. 350 cc of frankly purulent material aspirated. Final CT demonstrates adequate positioning of the drain with partial decompression of the cavity. IMPRESSION: Status post CT-guided drainage of left upper quadrant abscess. Signed, Dulcy Fanny. Nadene Rubins, RPVI [facility] Radiology Electronically Signed   By: Corrie Mckusick D.O.   On: 02/16/2022 12:47   CT ABDOMEN PELVIS WO CONTRAST  Result Date: 02/15/2022 CLINICAL DATA:  Abdominal pain, post-op s/p SBR and transverse colectomy for large retroperitoneal mass, fevers, leukocytosis. EXAM: CT ABDOMEN AND PELVIS WITHOUT CONTRAST TECHNIQUE: Multidetector CT imaging of the abdomen and pelvis was performed following the standard protocol without IV contrast. RADIATION DOSE REDUCTION: This exam was performed according to the departmental dose-optimization program which includes automated exposure control, adjustment of the mA and/or kV according to patient size and/or use of iterative reconstruction technique. COMPARISON:  CT 01/21/2022 FINDINGS: Lower chest: Bilateral lower lobe airspace disease, left greater than right. The left lower lobe pulmonary parenchyma is likely heterogeneous. Small left pleural effusion. Decreased density of the blood pool suggests anemia. Hepatobiliary: No evidence of focal hepatic abnormality on this unenhanced exam. Layering gallstones in the gallbladder which is mildly distended. No biliary dilatation. Pancreas: No  ductal dilatation or inflammation. Spleen: Subcapsular fluid collection, inferiorly containing air measures approximately 5.1 x 3.7 x 3.1 cm, series 3, image 35 and series 6, image 2018. The spleen is normal in size. Adrenals/Urinary Tract: Normal adrenal glands. No hydronephrosis. Mild bilateral perinephric edema. Minimally distended urinary bladder. Foley catheter is in place, balloon is likely in the bladder. Air in the bladder is consistent with Foley placement. Stomach/Bowel: Lack of enteric contrast limits detailed bowel assessment. Interval transverse colostomy. There is no small bowel obstruction or postoperative ileus. The appendix is tentatively visualized and normal. Surgical drain entering from the right paramidline courses into the left abdomen and terminates in the left upper quadrant anteriorly. Just distal to the drain is a large heterogeneous ill-defined air fluid collection. Exact size measurement is difficult due to the ill-defined nature, however representative measurements of 12.4 x 13.9 x 14.9 cm, series 3, image 35 and series 6, image 37. This is at the level of the transverse colonic staple line. This may be contiguous with a component extending into the left upper quadrant series 3, image 29. Ill-defined air fluid collection in the left pericolic gutter measures approximately 7.4 x 4.3 x 8.4 cm, series 3, image 49 and series 6, image 85. There are multiple scattered small fluid collections and ill-defined fluid as well as mottled air within the right upper quadrant abdominal fat. Vascular/Lymphatic: Normal caliber abdominal aorta with mild atherosclerosis. Large presumed lymph node adjacent to the celiac axis measures 3.7 cm series 3, image 23. There are multifocal prominent and mildly enlarged retroperitoneal nodes, including 13 mm aortocaval node series 3, image 39. Prominent and mildly enlarged mesenteric, periportal and upper abdominal nodes measuring up to 14 mm series 3, image 32  adjacent to the fluid collection. Reproductive: No prostatic mass. Other: Recent postsurgical change with open midline abdominal wound. Scattered foci of free air deep to the abdominal wound as well as patchy air within the right upper abdominal fat. Abdominopelvic fluid collections as described above. Scattered areas of non organized free fluid in the upper abdomen and pelvis. Generalized body Delis edema consistent with anasarca. Musculoskeletal: There are no acute or suspicious osseous abnormalities. IMPRESSION: 1.  Interval transverse colostomy. Large ill-defined air fluid collection just distal to the surgical drain in the left upper quadrant adjacent to the transverse colonic suture line measuring up to 14.9 cm, suspicious for abscess. There are multiple additional smaller air-fluid collections in the abdomen as described. Areas of non organized free fluid are also seen which are indeterminate. 2. Subcapsular fluid collection about the spleen inferiorly containing air measuring 5.1 x 3.7 x 3.1 cm, suspicious for abscess. 3. Bilateral lower lobe airspace disease, left greater than right, greater than typically seen with atelectasis and suspicious for pneumonia. Small left pleural effusion. 4. Recent postsurgical change with open midline abdominal wound. Scattered foci of free air deep to the abdominal wound as well as patchy air within the right upper quadrant abdominal fat, likely postsurgical. 5. Cholelithiasis without CT findings of acute cholecystitis. 6. Abdominal adenopathy with a dominant lymph node adjacent to the celiac axis. Prominent and mildly enlarged retroperitoneal, mesenteric, periportal, and upper abdominal nodes, nonspecific. 7. Generalized body Fallin edema consistent with anasarca. Aortic Atherosclerosis (ICD10-I70.0). These results will be called to the ordering clinician or representative by the Radiologist Assistant, and communication documented in the PACS or [employer]. Electronically  Signed   By: Keith Rake M.D.   On: 02/15/2022 18:16   ECHOCARDIOGRAM COMPLETE  Result Date: 02/15/2022    ECHOCARDIOGRAM REPORT   Patient Name:   Patrick [name] Rodman Date of Exam: 02/15/2022 Medical Rec #:  [id number]    Height:       72.0 in Accession #:    [id number]   Weight:       247.8 lb Date of Birth:  1970-12-01   BSA:          2.334 m Patient Age:    89 years     BP:           106/65 mmHg Patient Gender: M            HR:           104 bpm. Exam Location:  Inpatient Procedure: 2D Echo, Color Doppler and Cardiac Doppler Indications:    Fluid overload [id number]  History:        Patient has no prior history of Echocardiogram examinations.                 CAD, Prior CABG; Risk Factors:Diabetes.  Sonographer:    Greer Pickerel Referring Phys: [id number] Saverio Danker  Sonographer Comments: Patient is obese. Image acquisition challenging due to respiratory motion. IMPRESSIONS  1. Left ventricular ejection fraction, by estimation, is 50%. The left ventricle has mildly decreased function. The left ventricle demonstrates global hypokinesis with septal bounce suggesting prior cardiac surgery. Left ventricular diastolic parameters  are consistent with Grade II diastolic dysfunction (pseudonormalization).  2. Peak RV-RA gradient 14 mmHg. Right ventricular systolic function is normal. The right ventricular size is normal.  3. Left atrial size was moderately dilated.  4. Right atrial size was mildly dilated.  5. The mitral valve is normal in structure. No evidence of mitral valve regurgitation. No evidence of mitral stenosis.  6. The aortic valve is tricuspid. Aortic valve regurgitation is not visualized. No aortic stenosis is present.  7. Aortic dilatation noted. There is mild dilatation of the ascending aorta, measuring 40 mm.  8. Technically difficult study with poor acoustic windows. FINDINGS  Left Ventricle: Left ventricular ejection fraction, by estimation, is 50%. The left ventricle has mildly decreased function. The left  ventricle demonstrates global hypokinesis. The left  ventricular internal cavity size was normal in size. There is no left ventricular hypertrophy. Left ventricular diastolic parameters are consistent with Grade II diastolic dysfunction (pseudonormalization). Right Ventricle: Peak RV-RA gradient 14 mmHg. The right ventricular size is normal. No increase in right ventricular [place] thickness. Right ventricular systolic function is normal. Left Atrium: Left atrial size was moderately dilated. Right Atrium: Right atrial size was mildly dilated. Pericardium: There is no evidence of pericardial effusion. Mitral Valve: The mitral valve is normal in structure. No evidence of mitral valve regurgitation. No evidence of mitral valve stenosis. Tricuspid Valve: The tricuspid valve is normal in structure. Tricuspid valve regurgitation is trivial. Aortic Valve: The aortic valve is tricuspid. Aortic valve regurgitation is not visualized. No aortic stenosis is present. Pulmonic Valve: The pulmonic valve was normal in structure. Pulmonic valve regurgitation is not visualized. Aorta: The aortic root is normal in size and structure and aortic dilatation noted. There is mild dilatation of the ascending aorta, measuring 40 mm. Venous: The inferior vena cava was not well visualized. IAS/Shunts: No atrial level shunt detected by color flow Doppler.  LEFT VENTRICLE PLAX 2D LVIDd:         5.30 cm   Diastology LVIDs:         3.70 cm   LV e' medial:    10.90 cm/s LV PW:         1.10 cm   LV E/e' medial:  11.3 LV IVS:        0.80 cm   LV e' lateral:   16.60 cm/s LVOT diam:     2.40 cm   LV E/e' lateral: 7.4 LV SV:         86 LV SV Index:   37 LVOT Area:     4.52 cm  RIGHT VENTRICLE RV S prime:     12.30 cm/s TAPSE (M-mode): 2.2 cm LEFT ATRIUM            Index        RIGHT ATRIUM           Index LA diam:      4.90 cm  2.10 cm/m   RA Area:     19.40 cm LA Vol (A2C): 47.4 ml  20.31 ml/m  RA Volume:   48.70 ml  20.87 ml/m LA Vol (A4C): 100.0 ml  42.85 ml/m  AORTIC VALVE             PULMONIC VALVE LVOT Vmax:   120.00 cm/s PR End Diast Vel: 1.83 msec LVOT Vmean:  80.400 cm/s LVOT VTI:    0.191 m  AORTA Ao Root diam: 3.80 cm Ao Asc diam:  4.00 cm MITRAL VALVE                TRICUSPID VALVE MV Area (PHT): 2.33 cm     TR Peak grad:   14.6 mmHg MV Decel Time: 325 msec     TR Vmax:        191.00 cm/s MV E velocity: 123.00 cm/s MV A velocity: 43.50 cm/s   SHUNTS MV E/A ratio:  2.83         Systemic VTI:  0.19 m                             Systemic Diam: 2.40 cm Dalton McleanMD Electronically signed by Franki Monte Signature Date/Time: 02/15/2022/5:47:20 PM    Final    DG CHEST PORT 1 VIEW  Result Date: 02/15/2022 CLINICAL DATA:  Chest pain and hypoxia. Status post colectomy and colostomy creation. EXAM: PORTABLE CHEST 1 VIEW COMPARISON:  CT abdomen 02/04/2022 and abdominal radiograph 02/06/2022 FINDINGS: Low lung volumes with some elevation of left hemidiaphragm. Patchy densities at the right lung base most likely represent atelectasis. Persistent densities at the left lung base. There is a right arm PICC line with the tip near the superior cavoatrial junction. Heart size appears to be upper limits of normal with post CABG changes. IMPRESSION: Low lung volumes with bibasilar lung densities. Findings could represent a combination of atelectasis and consolidation. Cannot exclude pleural effusions. Electronically Signed   By: Markus Daft M.D.   On: 02/15/2022 11:15   DG Abd 1 View  Result Date: 02/14/2022 CLINICAL DATA:  52 year old male status post NG tube placement. Status post exploratory laparotomy postoperative day 4, retroperitoneal mass with invasion of proximal small bowel, transverse colon, perforation and fecal contamination. EXAM: ABDOMEN - 1 VIEW COMPARISON:  CT Abdomen and Pelvis 02/01/2022. FINDINGS: Prior CABG. There is evidence of a linear enteric tube in the midline projecting over the thoracic spine to the T7-T8 level. Tip is just at the  level of the diaphragm. Side hole is not included. Left upper quadrant surgical clips are new. Paucity of bowel gas in the upper abdomen. Left lung base opacity also appears increased. IMPRESSION: 1. Enteric tube projects over the midline thoracic spine, tip at the level of the diaphragm. This should probably be advanced about 12 cm to allow for side hole placement within the stomach. Recommend repeat portable KUB at that time. 2. Recent postoperative changes in the left upper quadrant. Increased left lung base opacity since 02/11/2022. Electronically Signed   By: Genevie Ann M.D.   On: 02/14/2022 05:32   [place] EKG SITE RITE  Result Date: 02/12/2022 If Site Rite image not attached, placement could not be confirmed due to current cardiac rhythm.   Microbiology No results found for this or any previous visit (from the past 240 hour(s)).   Procedures/Operations  01/29/2022 - Dr. Erroll Luna - Exploratory laparotomy with resection of transverse colon, drainage of mesenteric abscess, resection of proximal jejunum with small bowel anastomosis and 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    03/12/2022 - Dr. Autumn Messing - EXPLORATORY LAPAROTOMY WITH ABDOMINAL IRRIGATION AND DEBRIDMENT, DRAINAGE INTRAABDOMINAL [place] 03/12/2022, 2:31 PM

## 2022-03-19 NOTE — Progress Notes (Addendum)
11 Days Post-Op   Subjective/Chief Complaint: Patient with ongoing tachycardia and increased work of breathing, O2 desaturations overnight. Also febrile yesterday afternoon. Midline drainage slowed down last 12 hours per RN.    Objective: Vital signs in last 24 hours: Temp:  [98.4 F (36.9 C)-101.5 F (38.6 C)] 98.4 F (36.9 C) 2022-03-08 0400) Pulse Rate:  [102-131] 102 03-08-2022 0700) Resp:  [16-33] 16 08-Mar-2022 0700) BP: (88-125)/(44-75) 116/75 03/08/2022 0700) SpO2:  [91 %-99 %] 99 % Mar 08, 2022 0700) Last BM Date :  (no output in ostomy yet)  Intake/Output from previous day: 02/12 0701 - 08-Mar-2022 0700 In: 3366.9 [I.V.:2068.2; IV Piggyback:1298.7] Out: 3031 [Urine:2200; Emesis/NG output:50; Drains:781] Intake/Output this shift: Total I/O In: 0.3 [I.V.:0.3] Out: -   Gen: appears ill Heart: regular,tachycardic 120's Lungs: increased wob  Abd: soft, wound with a lot of enteric drainage.  LLQ drain with bilious output.  300cc/24hrs  JP 3 with eneteric drainage, 481 cc in 24 hrs.  JP 2 with scant bloody drainage. Colostomy with no output GU: male purewick in place, clear yellow urine Ext: BLE edema   Lab Results:  Recent Labs    03/01/22 0328 03/01/22 0845 Mar 08, 2022 0553  WBC 6.6  --  5.3  HGB 7.5* 7.1* 7.2*  HCT 24.5* 21.0* 24.3*  PLT 597*  --  606*   BMET Recent Labs    03/01/22 0328 03/01/22 0845 03/08/2022 0553  NA 153* 158* 156*  K 4.4 4.2 4.8  CL 119*  --  124*  CO2 21*  --  21*  GLUCOSE 234*  --  272*  BUN 99*  --  105*  CREATININE 3.00*  --  3.27*  CALCIUM 8.0*  --  7.9*   PT/INR No results for input(s): "LABPROT", "INR" in the last 72 hours. ABG Recent Labs    03/01/22 0845  PHART 7.432  HCO3 20.6    Studies/Results: DG Abd 1 View  Result Date: 03/01/2022 CLINICAL DATA:  Nasogastric tube placement. EXAM: ABDOMEN - 1 VIEW COMPARISON:  February 19, 2022. FINDINGS: Distal tip of nasogastric tube is seen in proximal stomach. IMPRESSION: Distal tip of nasogastric  tube seen in proximal stomach. Electronically Signed   By: Marijo Conception M.D.   On: 03/01/2022 14:21   CT CHEST ABDOMEN PELVIS WO CONTRAST  Result Date: 03/01/2022 CLINICAL DATA:  Shortness of breath. Follow-up abdominal abscesses. Status post transverse colectomy. Lymphoma. EXAM: CT CHEST, ABDOMEN AND PELVIS WITHOUT CONTRAST TECHNIQUE: Multidetector CT imaging of the chest, abdomen and pelvis was performed following the standard protocol without IV contrast. RADIATION DOSE REDUCTION: This exam was performed according to the departmental dose-optimization program which includes automated exposure control, adjustment of the mA and/or kV according to patient size and/or use of iterative reconstruction technique. COMPARISON:  02/26/2022 FINDINGS: CT CHEST FINDINGS Cardiovascular: The heart is normal in size. No pericardial effusion. No evidence of thoracic aortic aneurysm. Atherosclerotic calcifications of the arch. Severe three-vessel coronary atherosclerosis. Postsurgical changes related to prior CABG. Right arm PICC terminates at the cavoatrial junction. Mediastinum/Nodes: No suspicious mediastinal lymphadenopathy. Visualized thyroid is unremarkable. Lungs/Pleura: Small left pleural effusion, unchanged. Lingular and bilateral lower lobe opacities, likely atelectasis, unchanged. No focal consolidation. No suspicious pulmonary nodules, noting motion degradation. No pneumothorax. Musculoskeletal: Degenerative changes of the lower thoracic spine. Median sternotomy. CT ABDOMEN PELVIS FINDINGS Hepatobiliary: Unenhanced liver is unremarkable. Layering gallstones (series 3/image 65), without associated inflammatory changes. No intrahepatic or extrahepatic duct dilatation. Pancreas: Within normal limits. Spleen: Normal in size. Mild pericapsular fluid  inferiorly (series 3/image 32), unchanged. Adrenals/Urinary Tract: Adrenal glands are within normal limits. 13 mm lateral left upper pole renal cyst (series 3/image 37),  poorly evaluated on unenhanced CT, likely benign. Right kidney is within normal limits. No renal calculi or hydronephrosis. Bladder is within normal limits. Stomach/Bowel: Stomach is within normal limits. No evidence of bowel obstruction. Normal appendix (series 3/image 109). Status post transverse colectomy with right mid abdominal colostomy. Vascular/Lymphatic: No evidence of abdominal aortic aneurysm. Atherosclerotic calcifications of the abdominal aorta and branch vessels. Abdominal/retroperitoneal lymphadenopathy with index 3.7 cm short axis gastrohepatic node (series 3/image 60), unchanged. Reproductive: Prostate is unremarkable. Other: 5.1 x 8.4 cm gas and fluid collection beneath the left anterior abdominal Vanosdol (series 3/image 70), with associated surgical drain, previously 5.9 x 8.6 cm. Mottled gas beneath the right mid anterior abdominal Hosein (series 3/image 34), without well-defined rim/Mittag, with adjacent surgical drain, unchanged. 3.0 x 1.6 cm fluid collection along the right pericolic gutter (series 3/image 94), previously 2.1 x 3.6 cm, mildly improved. Small volume pelvic fluid (series 3/image 123), unchanged. Musculoskeletal: Mild degenerative changes of the lumbar spine. IMPRESSION: Status post transverse colectomy with right mid abdominal colostomy. Multiple fluid collections/abscesses with scattered gas and indwelling surgical drain, overall mildly improved, as described above. Small left pleural effusion with scattered atelectasis, unchanged. No findings suspicious for pneumonia. Stable abdominal/retroperitoneal lymphadenopathy in this patient with known lymphoma. Additional stable ancillary findings as above. Electronically Signed   By: Julian Hy M.D.   On: 03/01/2022 09:57   DG Chest Port 1 View  Result Date: 03/01/2022 CLINICAL DATA:  Pneumonia EXAM: PORTABLE CHEST 1 VIEW COMPARISON:  Chest radiograph February 25, 2022 and chest CT February 26, 2022 FINDINGS: Cardiac monitoring  leads project over the chest. Prior median and CABG. Left atrial occlusion device. The heart size and mediastinal contours are unchanged. Low lung volumes. Opacities in the left-greater-than-right lung bases with partial obscuration of the left hemidiaphragm. Probable small left pleural effusion. The visualized skeletal structures are unchanged. IMPRESSION: 1. Low lung volumes with opacities in the left-greater-than-right lung bases, which may represent atelectasis or pneumonia. 2. Probable small left pleural effusion. Electronically Signed   By: Dahlia Bailiff M.D.   On: 03/01/2022 08:52    Anti-infectives: Anti-infectives (From admission, onward)    Start     Dose/Rate Route Frequency Ordered Stop   02/22/22 0800  vancomycin (VANCOREADY) IVPB 1250 mg/250 mL  Status:  Discontinued        1,250 mg 166.7 mL/hr over 90 Minutes Intravenous Every 24 hours 02/21/22 0233 02/23/22 0732   02/21/22 1030  micafungin (MYCAMINE) 150 mg in sodium chloride 0.9 % 100 mL IVPB  Status:  Discontinued        150 mg 107.5 mL/hr over 1 Hours Intravenous Every 24 hours 02/21/22 0934 02/24/22 0821   02/21/22 0315  vancomycin (VANCOREADY) IVPB 2000 mg/400 mL        2,000 mg 200 mL/hr over 120 Minutes Intravenous  Once 02/21/22 0229 02/21/22 0502   02/17/22 1200  piperacillin-tazobactam (ZOSYN) IVPB 3.375 g        3.375 g 12.5 mL/hr over 240 Minutes Intravenous Every 8 hours 02/17/22 1103     02/15/22 1500  piperacillin-tazobactam (ZOSYN) IVPB 3.375 g        3.375 g 12.5 mL/hr over 240 Minutes Intravenous Every 8 hours 02/15/22 0836 02/17/22 0217   01/30/2022 1100  piperacillin-tazobactam (ZOSYN) IVPB 3.375 g        3.375 g 100 mL/hr  over 30 Minutes Intravenous  Once 02/04/2022 1056 01/19/2022 1230   02/06/2022 0200  piperacillin-tazobactam (ZOSYN) IVPB 3.375 g  Status:  Discontinued        3.375 g 12.5 mL/hr over 240 Minutes Intravenous Every 8 hours 02/15/2022 2322 02/15/22 0836   01/26/2022 1900  piperacillin-tazobactam  (ZOSYN) IVPB 3.375 g        3.375 g 100 mL/hr over 30 Minutes Intravenous  Once 01/28/2022 1857 01/23/2022 2012       Assessment/Plan: 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, proximal small bowel with ischemic necrotic proximal jejunum with perforation fecal contamination with abscess   POD 19/10 S/P Exploratory laparotomy with resection of transverse colon, drainage of mesenteric abscess, resection of proximal jejunum with small bowel anastomosis and 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 by Dr. Brantley Stage.  Re-exploration with washout and new drain placement by Dr. Marlou Starks 2/2 - surgical path with large B-cell lymphoma - NPO, on TNA due to proximal small bowel leak  - CT chest/abd/pelvis yesterday with multiple fluid collections that are overall smaller but still not adequately draining via small caliber drains. Only real option surgically would be to widely open wound and leave him with open abdomen for wide drainage and to try and heal. Unfortunately today his overall clinical condition has worsened - fever, tachycardia, increased work of breathing and hypoxia, kidney failure (BUN 103/Cr 3.27 from 3.0 yesterday), and jaundice/hyperbilirubinemia. CCM PA-C Mickel Baas, Dr. Donne Hazel, and myself had a discussion with the patients wife at the bedside. We discussed that in order to continue aggressive treatment of sepsis due to intestinal leak the patient would require intubation, tracheostomy, possible dialysis, and multiple surgeries in efforts to save his life. After all of this there is a very low chance for him to return to his previous baseline quality of life.   After this discussion the patients wife elected to proceed with comfort care. She plans to call family (patients mother and 2 sons) so they can be all together.   Greatly appreciate CCM assistance in the management of this patient.     AKI  Fluid overload/hypoalbuminemia  Hypoxia/LLL PNA(resolved)  DM   Hyperbilirubinemia ABL anemia Fevers  Shock    Dispo: ICU, plans to transition to comfort care  Jill Alexanders 24-Mar-2022

## 2022-03-19 NOTE — Progress Notes (Signed)
This chaplain responded to RN consult for family spiritual care. The chaplain understands Pt. and family recently made the decision for comfort care.   The Pt. is awake and intermittently will add to conversation. The Pt. wife, mother, friend, and two sons are at the bedside and accept the chaplain's invitation for story telling. The chaplain learns the Pt. 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  The family accepted the chaplain's invitation for prayer and F/U spiritual care.  The chaplain is appreciative of RN-Evan's attentiveness to the Pt. comfort. The chaplain updated PMT NP-Mary.  Chaplain Sallyanne Kuster [phone]

## 2022-03-19 NOTE — Progress Notes (Signed)
Physical Therapy Discharge Patient Details Name: Patrick [name] MRN: [id number] DOB: July 22, 1970 Today's Date: 2022/03/03 Time:  -     Patient discharged from PT services secondary to  pt's wife has elected to change pt to comfort care . [facility]ute PT services not warranted at this time.   Please see latest therapy progress note for current level of functioning and progress toward goals.    Progress and discharge plan discussed with patient and/or caregiver:   Leighton Roach, PT  Acute Rehab Services Secure chat preferred Office [place] 03/03/22, 2:00 PM

## 2022-03-19 NOTE — Progress Notes (Addendum)
Nutrition Brief Note  Chart reviewed. Per notes, pt's wife has elected to proceed with comfort care.  No further nutrition interventions planned at this time. Please re-consult as needed.   Gustavus Bryant, MS, RD, LDN Inpatient Clinical Dietitian Please see [facility] for contact information.

## 2022-03-19 NOTE — Progress Notes (Deleted)
PHARMACY - TOTAL PARENTERAL NUTRITION CONSULT NOTE  Indication: Prolonged ileus  Patient Measurements: Height: 6' (182.9 cm) Weight: 104 kg (229 lb 4.5 oz) IBW/kg (Calculated) : 77.6 TPN AdjBW (KG): 86.6 Body mass index is 31.1 kg/m.  Assessment:  14 YOM presented with abdominal pain for 4 weeks, found to have retroperitoneal mass with necrotic jejunum, perforated fecal contamination and abscess. 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 Patient has been NPO for 4 days since admission. Patient/wife report he wasn't eating well for 4 weeks PTA and basically on a liquid diet. He endorses a ~10 lb weight loss during this time. Previously, he typically ate 2 meals a day (chicken, vegetables, beans and fruits) and 1 snack. Pharmacy consulted to dose TPN.  Glucose / Insulin: hx DM (A1C 6.6) on glipizide PTA. CBGs controlled <180 with a couple in the low 200's Received 27 units SSI in past 24 hrs + 60 units regular insulin in TPN bag Decadron IV x1 given preop 2/2 Electrolytes: Na 156, Cl 124, K 4.8, mag 2.5 (2/12), Phos 4.6 (2/12), Ca 7.9 (CoCa 9.98) Renal: AKI - SCr 3.27 (BL 1), BUN 105 Hepatic: LFTs WNL, Tbili remains elev at 8, TG 206 (propofol postop 2/2>>2/3), albumin <1.5 S/p thiamine x 3 days (1/26>1/28) Intake / Output; MIVF: UOP 0.9 ml/kg/hr, drain 300 mL, colostomy 0 ml; net +7.8 L this admit GI Imaging: 1/23 CT abd pelvis - Marked severity colitis with adjacent abscess  1/29 CT abd pelvis - suspicious for abscesses and PNA, small pleural effusion, anasarca  2/9- CT abd pelvis w/o contrast- New 2 x 3.5 cm RIGHT LATERAL abdomen/paracolic gutter collection/abscess and interval decrease in size of a 2.5 x 5 cm low pelvic collection/abscess, previously measured 4 x 6 cm. Interval decrease in size of the anterior LEFT abdominal  collection/abscess containing drain, now measuring 5.9 x 8.6 cm, previously 12.4 x 13.9 cm.  2/12- CT chest/pelvis w/o contrast- shows Multiple fluid collections/abscesses with scattered gas and indwelling surgical drain, overall mildly improved. Stable abdominal/retroperitoneal lymphadenopathy in this patient with known lymphoma  GI Surgeries / Procedures: 1/24 s/p ex lap with resection of transverse colon, drainage of mesenteric abscess, resection of proximal jejunum with SB anastomosis and diverting R colostomy and long Hartman's pouch 1/30 IR per drain placement 2/2 ex lap w/ I&D of abdominal abscess, 2 abdominal drains placed  Central access: PICC 02/12/22 TPN start date: 02/12/22  Nutritional Goals:  RD Estimated Needs Total Energy Estimated Needs: 2500-2800 Total Protein Estimated Needs: 140-155 grams Total Fluid Estimated Needs: >2 L/day   TPN goal rate: 85 mL/hr to provide 149 g AA and 2574 kCal/day  Current Nutrition:  TPN- concentrated starting 02/25/2022 PM  NPO  Plan:  Continue TPN at rate 85 ml/hr (concentrated formula) to provide 149 g AA, 408 g CHO, 59 g lipids and 2574 kCal, meeting 100% of needs Electrolytes in TPN: Na 0 mEq/L, K 37 meq/L, Ca 1.5 mEq/L, Mg 1.5 mEq/L, Phos 9 mmol/L; Cl:Ac maximized acetate  Add standard MVI to TPN T-bili 8. Jaundice - remove trace elements until resolved, continue to hold chromium for now but consider adding back as renal function improves.  Continue insulin in TPN to 60 units Continue resistant SSI Q4H Monitor TPN labs daily until stable, then standard Mon/Thurs Monitor TG (SMOF lipids 25% of total kCal). Anticipate some degree of TG improvement with stopping propofol  [name] BS, PharmD, BCPS Clinical Pharmacist Mar 23, 2022 7:26 AM  Contact: [phone] after 3 PM  "Be curious, not judgmental..." -[name]

## 2022-03-19 DEATH — deceased
